# Patient Record
Sex: Male | Born: 1970 | Race: White | Hispanic: No | Smoking: Current every day smoker
Health system: Southern US, Community
[De-identification: ages and names within clinical notes are randomized; demographics above are authoritative.]

## PROBLEM LIST (undated history)

## (undated) DIAGNOSIS — M51369 Other intervertebral disc degeneration, lumbar region without mention of lumbar back pain or lower extremity pain: Secondary | ICD-10-CM

## (undated) DIAGNOSIS — I214 Non-ST elevation (NSTEMI) myocardial infarction: Secondary | ICD-10-CM

## (undated) DIAGNOSIS — J449 Chronic obstructive pulmonary disease, unspecified: Secondary | ICD-10-CM

## (undated) DIAGNOSIS — Z8739 Personal history of other diseases of the musculoskeletal system and connective tissue: Secondary | ICD-10-CM

## (undated) DIAGNOSIS — M779 Enthesopathy, unspecified: Secondary | ICD-10-CM

## (undated) DIAGNOSIS — M47817 Spondylosis without myelopathy or radiculopathy, lumbosacral region: Secondary | ICD-10-CM

## (undated) DIAGNOSIS — R1314 Dysphagia, pharyngoesophageal phase: Secondary | ICD-10-CM

## (undated) DIAGNOSIS — S42009A Fracture of unspecified part of unspecified clavicle, initial encounter for closed fracture: Secondary | ICD-10-CM

## (undated) DIAGNOSIS — Z9289 Personal history of other medical treatment: Secondary | ICD-10-CM

## (undated) DIAGNOSIS — J189 Pneumonia, unspecified organism: Secondary | ICD-10-CM

## (undated) DIAGNOSIS — M5136 Other intervertebral disc degeneration, lumbar region: Secondary | ICD-10-CM

## (undated) DIAGNOSIS — Z720 Tobacco use: Secondary | ICD-10-CM

## (undated) DIAGNOSIS — D649 Anemia, unspecified: Secondary | ICD-10-CM

## (undated) DIAGNOSIS — R079 Chest pain, unspecified: Secondary | ICD-10-CM

## (undated) DIAGNOSIS — F1010 Alcohol abuse, uncomplicated: Secondary | ICD-10-CM

## (undated) DIAGNOSIS — M1990 Unspecified osteoarthritis, unspecified site: Secondary | ICD-10-CM

## (undated) DIAGNOSIS — I10 Essential (primary) hypertension: Secondary | ICD-10-CM

## (undated) HISTORY — DX: Other intervertebral disc degeneration, lumbar region without mention of lumbar back pain or lower extremity pain: M51.369

## (undated) HISTORY — PX: FRACTURE SURGERY: SHX138

## (undated) HISTORY — DX: Alcohol abuse, uncomplicated: F10.10

## (undated) HISTORY — DX: Chronic obstructive pulmonary disease, unspecified: J44.9

## (undated) HISTORY — DX: Other intervertebral disc degeneration, lumbar region: M51.36

## (undated) HISTORY — DX: Tobacco use: Z72.0

## (undated) HISTORY — PX: INGUINAL HERNIA REPAIR: SUR1180

## (undated) HISTORY — DX: Dysphagia, pharyngoesophageal phase: R13.14

## (undated) HISTORY — DX: Spondylosis without myelopathy or radiculopathy, lumbosacral region: M47.817

## (undated) HISTORY — DX: Fracture of unspecified part of unspecified clavicle, initial encounter for closed fracture: S42.009A

## (undated) HISTORY — DX: Enthesopathy, unspecified: M77.9

---

## 1997-09-10 ENCOUNTER — Emergency Department (HOSPITAL_COMMUNITY): Admission: EM | Admit: 1997-09-10 | Discharge: 1997-09-10 | Payer: Self-pay | Admitting: Emergency Medicine

## 1998-09-09 ENCOUNTER — Encounter: Payer: Self-pay | Admitting: Emergency Medicine

## 1998-09-09 ENCOUNTER — Emergency Department (HOSPITAL_COMMUNITY): Admission: EM | Admit: 1998-09-09 | Discharge: 1998-09-09 | Payer: Self-pay | Admitting: Emergency Medicine

## 2000-01-07 ENCOUNTER — Encounter: Payer: Self-pay | Admitting: Emergency Medicine

## 2000-01-07 ENCOUNTER — Observation Stay (HOSPITAL_COMMUNITY): Admission: EM | Admit: 2000-01-07 | Discharge: 2000-01-07 | Payer: Self-pay | Admitting: Emergency Medicine

## 2000-02-07 ENCOUNTER — Encounter: Payer: Self-pay | Admitting: Emergency Medicine

## 2000-02-07 ENCOUNTER — Emergency Department (HOSPITAL_COMMUNITY): Admission: EM | Admit: 2000-02-07 | Discharge: 2000-02-07 | Payer: Self-pay | Admitting: Emergency Medicine

## 2000-08-06 ENCOUNTER — Encounter: Payer: Self-pay | Admitting: Emergency Medicine

## 2000-08-06 ENCOUNTER — Emergency Department (HOSPITAL_COMMUNITY): Admission: EM | Admit: 2000-08-06 | Discharge: 2000-08-06 | Payer: Self-pay | Admitting: Emergency Medicine

## 2003-11-03 ENCOUNTER — Emergency Department (HOSPITAL_COMMUNITY): Admission: EM | Admit: 2003-11-03 | Discharge: 2003-11-03 | Payer: Self-pay | Admitting: Emergency Medicine

## 2004-11-07 ENCOUNTER — Emergency Department (HOSPITAL_COMMUNITY): Admission: EM | Admit: 2004-11-07 | Discharge: 2004-11-07 | Payer: Self-pay | Admitting: Emergency Medicine

## 2004-11-09 ENCOUNTER — Emergency Department (HOSPITAL_COMMUNITY): Admission: EM | Admit: 2004-11-09 | Discharge: 2004-11-09 | Payer: Self-pay | Admitting: Emergency Medicine

## 2005-02-26 ENCOUNTER — Emergency Department (HOSPITAL_COMMUNITY): Admission: EM | Admit: 2005-02-26 | Discharge: 2005-02-26 | Payer: Self-pay | Admitting: Emergency Medicine

## 2005-03-08 ENCOUNTER — Emergency Department (HOSPITAL_COMMUNITY): Admission: EM | Admit: 2005-03-08 | Discharge: 2005-03-08 | Payer: Self-pay | Admitting: Emergency Medicine

## 2006-10-24 ENCOUNTER — Emergency Department (HOSPITAL_COMMUNITY): Admission: EM | Admit: 2006-10-24 | Discharge: 2006-10-24 | Payer: Self-pay | Admitting: Emergency Medicine

## 2007-04-25 DIAGNOSIS — S42009A Fracture of unspecified part of unspecified clavicle, initial encounter for closed fracture: Secondary | ICD-10-CM

## 2007-04-25 HISTORY — PX: ORIF CLAVICLE FRACTURE: SUR924

## 2007-04-25 HISTORY — DX: Fracture of unspecified part of unspecified clavicle, initial encounter for closed fracture: S42.009A

## 2007-10-26 ENCOUNTER — Emergency Department (HOSPITAL_COMMUNITY): Admission: EM | Admit: 2007-10-26 | Discharge: 2007-10-26 | Payer: Self-pay | Admitting: Emergency Medicine

## 2008-03-03 ENCOUNTER — Inpatient Hospital Stay (HOSPITAL_COMMUNITY): Admission: EM | Admit: 2008-03-03 | Discharge: 2008-03-04 | Payer: Self-pay | Admitting: General Surgery

## 2008-03-31 ENCOUNTER — Ambulatory Visit (HOSPITAL_COMMUNITY): Admission: RE | Admit: 2008-03-31 | Discharge: 2008-03-31 | Payer: Self-pay | Admitting: Orthopedic Surgery

## 2008-08-31 ENCOUNTER — Emergency Department (HOSPITAL_COMMUNITY): Admission: EM | Admit: 2008-08-31 | Discharge: 2008-09-01 | Payer: Self-pay | Admitting: Emergency Medicine

## 2009-12-08 ENCOUNTER — Ambulatory Visit: Payer: Self-pay | Admitting: Internal Medicine

## 2009-12-08 DIAGNOSIS — Z7289 Other problems related to lifestyle: Secondary | ICD-10-CM

## 2009-12-08 DIAGNOSIS — F17200 Nicotine dependence, unspecified, uncomplicated: Secondary | ICD-10-CM

## 2009-12-08 DIAGNOSIS — T23239A Burn of second degree of unspecified multiple fingers (nail), not including thumb, initial encounter: Secondary | ICD-10-CM | POA: Insufficient documentation

## 2009-12-08 DIAGNOSIS — Z862 Personal history of diseases of the blood and blood-forming organs and certain disorders involving the immune mechanism: Secondary | ICD-10-CM

## 2009-12-08 DIAGNOSIS — R209 Unspecified disturbances of skin sensation: Secondary | ICD-10-CM | POA: Insufficient documentation

## 2009-12-08 DIAGNOSIS — M542 Cervicalgia: Secondary | ICD-10-CM | POA: Insufficient documentation

## 2009-12-10 ENCOUNTER — Ambulatory Visit: Payer: Self-pay | Admitting: Internal Medicine

## 2009-12-10 ENCOUNTER — Encounter: Payer: Self-pay | Admitting: Family Medicine

## 2009-12-13 ENCOUNTER — Encounter (INDEPENDENT_AMBULATORY_CARE_PROVIDER_SITE_OTHER): Payer: Self-pay | Admitting: *Deleted

## 2009-12-13 LAB — CONVERTED CEMR LAB
ALT: 12 units/L (ref 0–53)
Albumin: 4.8 g/dL (ref 3.5–5.2)
Alkaline Phosphatase: 59 units/L (ref 39–117)
CO2: 22 meq/L (ref 19–32)
Calcium: 9.9 mg/dL (ref 8.4–10.5)
Chloride: 103 meq/L (ref 96–112)
Hemoglobin: 15.4 g/dL (ref 13.0–17.0)
LDL Cholesterol: 132 mg/dL — ABNORMAL HIGH (ref 0–99)
Lymphocytes Relative: 23 % (ref 12–46)
Monocytes Absolute: 0.8 10*3/uL (ref 0.1–1.0)
Neutro Abs: 6.6 10*3/uL (ref 1.7–7.7)
Platelets: 318 10*3/uL (ref 150–400)
TSH: 1.33 microintl units/mL (ref 0.350–4.500)
Total CHOL/HDL Ratio: 3
Triglycerides: 44 mg/dL (ref <150)
VLDL: 9 mg/dL (ref 0–40)
WBC: 9.9 10*3/uL (ref 4.0–10.5)

## 2010-01-14 ENCOUNTER — Ambulatory Visit: Payer: Self-pay | Admitting: Internal Medicine

## 2010-01-14 DIAGNOSIS — R1310 Dysphagia, unspecified: Secondary | ICD-10-CM

## 2010-02-07 ENCOUNTER — Ambulatory Visit (HOSPITAL_COMMUNITY): Admission: RE | Admit: 2010-02-07 | Discharge: 2010-02-07 | Payer: Self-pay | Admitting: Family Medicine

## 2010-04-06 ENCOUNTER — Encounter: Payer: Self-pay | Admitting: Family Medicine

## 2010-04-06 DIAGNOSIS — M47817 Spondylosis without myelopathy or radiculopathy, lumbosacral region: Secondary | ICD-10-CM

## 2010-04-06 HISTORY — DX: Spondylosis without myelopathy or radiculopathy, lumbosacral region: M47.817

## 2010-04-21 ENCOUNTER — Ambulatory Visit
Admission: RE | Admit: 2010-04-21 | Discharge: 2010-04-21 | Payer: Self-pay | Source: Home / Self Care | Attending: Internal Medicine | Admitting: Internal Medicine

## 2010-04-21 DIAGNOSIS — M545 Low back pain, unspecified: Secondary | ICD-10-CM | POA: Insufficient documentation

## 2010-04-26 ENCOUNTER — Encounter: Payer: Self-pay | Admitting: Family Medicine

## 2010-04-26 ENCOUNTER — Ambulatory Visit
Admission: RE | Admit: 2010-04-26 | Discharge: 2010-04-26 | Payer: Self-pay | Source: Home / Self Care | Attending: Internal Medicine | Admitting: Internal Medicine

## 2010-04-27 ENCOUNTER — Telehealth: Payer: Self-pay | Admitting: Family Medicine

## 2010-05-02 ENCOUNTER — Ambulatory Visit
Admission: RE | Admit: 2010-05-02 | Discharge: 2010-05-02 | Payer: Self-pay | Source: Home / Self Care | Attending: Family Medicine | Admitting: Family Medicine

## 2010-05-06 ENCOUNTER — Encounter: Payer: Self-pay | Admitting: Family Medicine

## 2010-05-14 ENCOUNTER — Encounter: Payer: Self-pay | Admitting: Family Medicine

## 2010-05-15 ENCOUNTER — Encounter: Payer: Self-pay | Admitting: *Deleted

## 2010-05-18 DIAGNOSIS — Z0289 Encounter for other administrative examinations: Secondary | ICD-10-CM

## 2010-05-19 ENCOUNTER — Ambulatory Visit: Admit: 2010-05-19 | Payer: Self-pay | Admitting: Family Medicine
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
## 2010-05-24 NOTE — Letter (Signed)
Summary: Generic Letter  [facility] at [facility]  [address] [place], [place] [address]   Phone: [phone]  Fax: [phone]    12/13/2009  [name] [address] [place], [place]  [address]  Dear [name],  All of your labs are currently within normal ranges. I have a included a copy for your review and records. Dr. Sharen Hones still recommends quitting smoking. We will be glad to help you with this if you so desire.   Please do not hesitate to contact our office should you have any questions or concerns.            Sincerely,      Selena Batten Dance CMA (AAMA)for  Terry Canes Gutierrez,MD
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
## 2010-05-26 NOTE — Letter (Signed)
Summary: ERROR  [place] at [facility]  [address] [place], [place] [address]   Phone: [phone]  Fax: [phone]    April 26, 2010   Employee:  USIEL ASTARITA    To Whom It May Concern:   For Medical reasons, please excuse the above named employee from work for the following dates:  Start:  April 26, 2010   End:  April 26, 2010   Pt was also seen in clinic on 04/21/2010.  If you need additional information, please feel free to contact our office.         Sincerely,    Eustaquio Boyden  MD
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
## 2010-05-26 NOTE — Progress Notes (Signed)
Summary: Outgoing call to patient  ---- Converted from flag ---- ---- 04/26/2010 6:31 PM, Terry Boyden  MD wrote: can we call with update this afternoon?  if no better, offer referral to ortho for further eval, possible epidural injections   Spoke with patient. He says he is no better at all. It hurts to even turn over in bed. He says the only that even remotely eases the pain is "drinking a little". I advised he shouldn't do that with medications, but he said he wanted to be honest which I told him I appreciated. He is good with a referral and I told him to expect a call from someone in regards to one. Kim Dance CMA Duncan Dull)  April 27, 2010 1:06 PM  ------------------------------  referral in system.  please fax latest ov as well as radiology report. Terry Boyden  MD  April 27, 2010 1:27 PM
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
## 2010-05-26 NOTE — Letter (Signed)
Summary: Out of Work  [facility]  [address] [place], [place] [address]   Phone: [phone]  Fax: [phone]    April 26, 2010   Employee:  ASHER TORPEY    To Whom It May Concern:   For Medical reasons, please excuse the above named employee from work for the following dates:  Start:  April 26, 2010   End:  April 29, 2010   If you need additional information, please feel free to contact our office.         Sincerely,    Eustaquio Boyden  MD

## 2010-08-02 LAB — COMPREHENSIVE METABOLIC PANEL
ALT: 19 U/L (ref 0–53)
BUN: 3 mg/dL — ABNORMAL LOW (ref 6–23)
CO2: 31 mEq/L (ref 19–32)
Calcium: 8.6 mg/dL (ref 8.4–10.5)
GFR calc Af Amer: >60 mL/min (ref >60)
GFR calc non Af Amer: >60 mL/min (ref >60)
Sodium: 139 mEq/L (ref 135–145)
Total Protein: 6.5 g/dL (ref 6.0–8.3)

## 2010-08-02 LAB — CBC
HCT: 43.4 % (ref 39.0–52.0)
MCV: 94.5 fL (ref 78.0–100.0)
RBC: 4.59 MIL/uL (ref 4.22–5.81)
RDW: 14.3 % (ref 11.5–15.5)
WBC: 8.3 10*3/uL (ref 4.0–10.5)

## 2010-08-02 LAB — DIFFERENTIAL
Basophils Absolute: 0.1 10*3/uL (ref 0.0–0.1)
Basophils Relative: 1 % (ref 0–1)

## 2010-08-02 LAB — SALICYLATE LEVEL: Salicylate Lvl: <4 mg/dL (ref 2.8–20.0)

## 2010-09-06 NOTE — Op Note (Signed)
NAMECONAN, MCMANAWAY                  ACCOUNT NO.:  [id number]   MEDICAL RECORD NO.:  [id number]          PATIENT TYPE:  AMB   LOCATION:  SDS                          FACILITY:  [facility]   PHYSICIAN:  Doralee Albino. Carola Frost, M.D. DATE OF BIRTH:  05-17-1970   DATE OF PROCEDURE:  03/31/2008  DATE OF DISCHARGE:                               OPERATIVE REPORT   PREOPERATIVE DIAGNOSIS:  Left clavicle fracture.   POSTOPERATIVE DIAGNOSIS:  Left clavicle fracture.   PROCEDURE:  Open reduction and internal fixation of left clavicle.   SURGEON:  Doralee Albino. Carola Frost, MD   ASSISTANT:  Mearl Latin, PA-C   ANESTHESIA:  General.   COMPLICATIONS:  None.   SPECIMENS:  None.   ESTIMATED BLOOD LOSS:  40 mL.   DISPOSITION:  To PACU.   CONDITION:  Stable.   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  He sustained a severely shortened left clavicle  fracture, and we discussed preoperative risks and benefits of surgical  versus nonsurgical treatment given the 31 mm of shortening.  After that  discussion, he did wish to proceed with surgery accepting the risks of  malunion, nonunion, infection, nerve injury, vessel injury, need for  further surgery, DVT, and others, and after full discussion again wished  to proceed.   DESCRIPTION OF PROCEDURE:  Mr. Medlock was administered preop antibiotics  and taken to the operating room where general anesthesia was induced.  His left upper extremity was prepped and draped in usual sterile fashion  after being positioned supine on the radiolucent table.  I then made a  standard linear incision over the clavicle, carrying dissection  carefully down to the superior surface, where the fracture site was  identified.  There was no periosteum overlying the shortened lateral  segment, but it was protected anteriorly, inferiorly, and posteriorly.  The proximal  segment retained all of its periosteal attachments, after  carefully freeing this up was able to reestablish length, checked the  provisional reduction obtained with a lag screw, and then applied  appropriate size plate, which allowed for compression of the fracture  through the dynamic holes as well as 3 bicortical screws in the proximal  fragment, including 1 which was locked, as well as 4 in the lateral  segment, 1 of which was locked.  The patient had excellent bone quality.  Final AP, cephalic, and caudal views confirmed appropriate reduction and  hardware length and placement.  Wound was copiously irrigated and then  closed in standard layered fashion with 0 Vicryl, 2-0 Vicryl, and  running Prolene and Steri-Strips.  Local consisting of Marcaine was then  injected into the area.  The patient had a sterile gently compressive  dressing applied and then a sling, and was taken to the PACU in stable  condition.  Montez Morita, PA-C assisted throughout the procedure with  careful retraction to prevent neurovascular injury, assistance with  provisional placement of fixation as well as definitive fixation, and  then helping with  simultaneous closure as well.   PROGNOSIS:  Mr. Sommerville should go on to unite as we were able to protect  the periosteal layer, but certainly his smoking increased his risk of  nonunion.  Restoring length if he is able to go on and heal and avoid  early complications should improve his strength and endurance of the  shoulder for return to active work as well.  If he is sufficiently  comfortable in the recovery room, we anticipate discharge to home versus  a 23-hour observation.      Doralee Albino. Carola Frost, M.D.  Electronically Signed     MHH/MEDQ  D:  03/31/2008  T:  03/31/2008  Job:  [id number]
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
## 2010-10-04 ENCOUNTER — Emergency Department (HOSPITAL_COMMUNITY)
Admission: EM | Admit: 2010-10-04 | Discharge: 2010-10-04 | Disposition: A | Discharge status: Home / Self Care | Payer: PRIVATE HEALTH INSURANCE | Attending: Emergency Medicine | Admitting: Emergency Medicine

## 2010-10-04 DIAGNOSIS — A938 Other specified arthropod-borne viral fevers: Secondary | ICD-10-CM | POA: Insufficient documentation

## 2010-10-04 DIAGNOSIS — S30860A Insect bite (nonvenomous) of lower back and pelvis, initial encounter: Secondary | ICD-10-CM | POA: Insufficient documentation

## 2010-10-04 DIAGNOSIS — R5381 Other malaise: Secondary | ICD-10-CM | POA: Insufficient documentation

## 2010-10-04 DIAGNOSIS — IMO0001 Reserved for inherently not codable concepts without codable children: Secondary | ICD-10-CM | POA: Insufficient documentation

## 2010-10-04 DIAGNOSIS — N5089 Other specified disorders of the male genital organs: Secondary | ICD-10-CM | POA: Insufficient documentation

## 2010-10-04 DIAGNOSIS — W57XXXA Bitten or stung by nonvenomous insect and other nonvenomous arthropods, initial encounter: Secondary | ICD-10-CM | POA: Insufficient documentation

## 2010-10-04 DIAGNOSIS — R51 Headache: Secondary | ICD-10-CM | POA: Insufficient documentation

## 2010-10-07 ENCOUNTER — Emergency Department (HOSPITAL_COMMUNITY): Payer: PRIVATE HEALTH INSURANCE

## 2010-10-07 ENCOUNTER — Emergency Department (HOSPITAL_COMMUNITY)
Admission: EM | Admit: 2010-10-07 | Discharge: 2010-10-07 | Disposition: A | Discharge status: Home / Self Care | Payer: PRIVATE HEALTH INSURANCE | Attending: Emergency Medicine | Admitting: Emergency Medicine

## 2010-10-07 DIAGNOSIS — R509 Fever, unspecified: Secondary | ICD-10-CM | POA: Insufficient documentation

## 2010-10-07 DIAGNOSIS — R112 Nausea with vomiting, unspecified: Secondary | ICD-10-CM | POA: Insufficient documentation

## 2010-10-07 DIAGNOSIS — R05 Cough: Secondary | ICD-10-CM | POA: Insufficient documentation

## 2010-10-07 DIAGNOSIS — Z79899 Other long term (current) drug therapy: Secondary | ICD-10-CM | POA: Insufficient documentation

## 2010-10-07 DIAGNOSIS — R059 Cough, unspecified: Secondary | ICD-10-CM | POA: Insufficient documentation

## 2010-10-07 LAB — CBC
HCT: 47.6 % (ref 39.0–52.0)
MCH: 33.2 pg (ref 26.0–34.0)
MCHC: 36.8 g/dL — ABNORMAL HIGH (ref 30.0–36.0)
Platelets: 268 10*3/uL (ref 150–400)
RBC: 5.27 MIL/uL (ref 4.22–5.81)
RDW: 13.6 % (ref 11.5–15.5)

## 2010-10-07 LAB — BASIC METABOLIC PANEL
BUN: 8 mg/dL (ref 6–23)
CO2: 28 mEq/L (ref 19–32)
Calcium: 8.9 mg/dL (ref 8.4–10.5)
Chloride: 101 mEq/L (ref 96–112)
Creatinine, Ser: 0.66 mg/dL (ref 0.50–1.35)
Glucose, Bld: 50 mg/dL — ABNORMAL LOW (ref 70–99)

## 2010-10-07 LAB — DIFFERENTIAL
Eosinophils Absolute: 0.4 10*3/uL (ref 0.0–0.7)
Eosinophils Relative: 4 % (ref 0–5)
Lymphs Abs: 2.1 10*3/uL (ref 0.7–4.0)
Neutro Abs: 8.4 10*3/uL — ABNORMAL HIGH (ref 1.7–7.7)

## 2011-01-24 LAB — DIFFERENTIAL
Basophils Absolute: 0
Basophils Relative: 0
Eosinophils Relative: 0
Lymphocytes Relative: 8 — ABNORMAL LOW
Lymphs Abs: 1.6
Monocytes Relative: 2 — ABNORMAL LOW
Neutro Abs: 17.6 — ABNORMAL HIGH
Neutrophils Relative %: 90 — ABNORMAL HIGH

## 2011-01-24 LAB — URINALYSIS, ROUTINE W REFLEX MICROSCOPIC
Glucose, UA: NEGATIVE
Specific Gravity, Urine: 1.025
Urobilinogen, UA: 0.2

## 2011-01-24 LAB — CBC
HCT: 45.3
Hemoglobin: 15.3
Platelets: 307
RBC: 4.78

## 2011-01-24 LAB — BASIC METABOLIC PANEL
CO2: 24
GFR calc Af Amer: >60

## 2011-01-26 LAB — CBC
HCT: 48.2 % (ref 39.0–52.0)
MCHC: 33.3 g/dL (ref 30.0–36.0)
MCV: 93.6 fL (ref 78.0–100.0)
Platelets: 276 10*3/uL (ref 150–400)
RBC: 5.14 MIL/uL (ref 4.22–5.81)
WBC: 7.9 10*3/uL (ref 4.0–10.5)

## 2011-01-26 LAB — COMPREHENSIVE METABOLIC PANEL
ALT: 19 U/L (ref 0–53)
GFR calc Af Amer: >60 mL/min (ref >60)
Glucose, Bld: 93 mg/dL (ref 70–99)
Total Bilirubin: 0.7 mg/dL (ref 0.3–1.2)
Total Protein: 7 g/dL (ref 6.0–8.3)

## 2011-04-25 HISTORY — PX: CARPAL TUNNEL RELEASE: SHX101

## 2011-06-01 ENCOUNTER — Encounter: Payer: Self-pay | Admitting: Family Medicine

## 2011-06-02 ENCOUNTER — Ambulatory Visit (INDEPENDENT_AMBULATORY_CARE_PROVIDER_SITE_OTHER): Payer: PRIVATE HEALTH INSURANCE | Admitting: Family Medicine

## 2011-06-02 ENCOUNTER — Encounter: Payer: Self-pay | Admitting: Family Medicine

## 2011-06-02 VITALS — BP 142/78 | HR 64 | Temp 98.5°F | Wt 142.8 lb

## 2011-06-02 DIAGNOSIS — R209 Unspecified disturbances of skin sensation: Secondary | ICD-10-CM

## 2011-06-02 MED ORDER — NAPROXEN 500 MG PO TABS: ORAL_TABLET | ORAL | Status: AC

## 2011-06-02 NOTE — Patient Instructions (Signed)
I wonder if this is carpal tunnel syndrome going on - we will refer you to hand doctor for further evaluation. Continue wrist brace. Start anti inflammatory twice daily for next several days with food then as needed.

## 2011-06-02 NOTE — Progress Notes (Signed)
  Subjective:    Patient ID: [name], male    DOB: [birth year], 41 y.o.   MRN: [id number]  HPI CC: hand pain  bilateral hand pain, R>L going on for years.  Worsening over last 2 months.  Pain worse at wrist, as well as radiates medial elbow.  Numbness throughout all 5 fingers, front and back, start proximal to wrist.  Significant worse pain/numbness at night time.  Wakes him up at night.  Endorses some weakness of right hand 2/2 pain (not as strong as prior).  Pain currently 4/10.  Pain located around wrist and right thumb, not fingers as much.  Fingers alternate white/red.  ultracet has helped.  Has brace he uses at home which helps but unable to use this and do work.  Works as Personnel officer.  Continued significant EtOH use - 2-3 beers every other night, then 12 pack on weekend.  EtOH helps pain.  Smoking - 1ppd.  Using e cig.  H/o R wrist hairline fracture as child.  No fevers/chills, neck pain, shooting pain down arms.  No other joint pains.  Denies inciting trauma.  Review of Systems Per HPI    Objective:   Physical Exam  Nursing note and vitals reviewed. Constitutional: He appears well-developed and well-nourished. No distress.  HENT:  Head: Normocephalic and atraumatic.  Mouth/Throat: Oropharynx is clear and moist. No oropharyngeal exudate.  Musculoskeletal: He exhibits no edema.       Mild swelling right wrist, tender to palpation at base of wrist anterior and posterior.  Slight erythema right > left hand No thenar/hypothenar atrophy Several callus  Neurological: No sensory deficit. He exhibits normal muscle tone.  Reflex Scores:      Bicep reflexes are 3+ on the right side and 3+ on the left side.      Patellar reflexes are 3+ on the right side and 3+ on the left side.      Right hand grip strength decreased compared to left. Altered sensation R palmar hand and digits compared to left Unable to tolerate phalen. + tinel on right  Skin: Skin is warm and dry. No rash  noted. There is erythema (mild at hands bilaterally).       Assessment & Plan:

## 2011-06-02 NOTE — Assessment & Plan Note (Signed)
Bilateral wrist pain with R>L hand numbness. Doubt cervical etiology. Refer to hand doctor for eval CTS/discuss treatment options. For now , continue wrist brace at night and NSAIDs for next week regularly. Update me if worsening.

## 2012-10-30 ENCOUNTER — Encounter: Payer: Self-pay | Admitting: Family Medicine

## 2012-10-30 ENCOUNTER — Ambulatory Visit (INDEPENDENT_AMBULATORY_CARE_PROVIDER_SITE_OTHER): Payer: PRIVATE HEALTH INSURANCE | Admitting: Family Medicine

## 2012-10-30 VITALS — BP 128/70 | HR 64 | Temp 99.2°F | Wt 136.0 lb

## 2012-10-30 DIAGNOSIS — T148 Other injury of unspecified body region: Secondary | ICD-10-CM

## 2012-10-30 DIAGNOSIS — W57XXXA Bitten or stung by nonvenomous insect and other nonvenomous arthropods, initial encounter: Secondary | ICD-10-CM

## 2012-10-30 MED ORDER — PREDNISONE 20 MG PO TABS: ORAL_TABLET | ORAL | Status: DC

## 2012-10-30 NOTE — Assessment & Plan Note (Signed)
No sign of envenomation, likely just a pronounced local allergy.  Add on pred taper with GI caution and use benadryl.  If systemic allergic sx to ER.  If sx of infection, none currently, then notify clinic.  He agrees.  Elevate.  Out of work for now. He agrees.  Nontoxic.

## 2012-10-30 NOTE — Progress Notes (Signed)
He was watering the garden and felt something sting him yesterday.  It was a black insect of some sort.  He couldn't ID the bug.  A few minutes later, swelling and pain.  Foot tingles today.  He can bear some weight on the heel but not the forefoot.  No FCNAVD.  Not SOB.  It is a little less painful than yesterday, but still tender.  Tetanus 2011.  No other injury.    Meds, vitals, and allergies reviewed.   ROS: See HPI.  Otherwise, noncontributory.  nad ncat Lips and tongue wnl, no edema No stridor rrr ctab L foot diffusely puffy but not red, not focally ttp but diffusely tender. Normal DP pulse.  Foot isn't hot and no FB noted.

## 2012-10-30 NOTE — Patient Instructions (Addendum)
Take prednisone with food daily.  2-->1-->1/2 tab.  Take benadryl 25mg  twice a day.  It can make you drowsy.  Elevate your leg.  If fever, redness, then notify the clinic.  If short of breath or lip swelling, to ER. Take care.

## 2014-01-12 ENCOUNTER — Ambulatory Visit: Payer: PRIVATE HEALTH INSURANCE | Admitting: Family Medicine

## 2014-01-13 ENCOUNTER — Ambulatory Visit (INDEPENDENT_AMBULATORY_CARE_PROVIDER_SITE_OTHER): Payer: PRIVATE HEALTH INSURANCE | Admitting: Family Medicine

## 2014-01-13 ENCOUNTER — Encounter: Payer: Self-pay | Admitting: Family Medicine

## 2014-01-13 VITALS — BP 105/74 | HR 81 | Temp 98.3°F | Wt 138.0 lb

## 2014-01-13 DIAGNOSIS — M7710 Lateral epicondylitis, unspecified elbow: Secondary | ICD-10-CM | POA: Insufficient documentation

## 2014-01-13 DIAGNOSIS — F439 Reaction to severe stress, unspecified: Secondary | ICD-10-CM | POA: Insufficient documentation

## 2014-01-13 DIAGNOSIS — M7711 Lateral epicondylitis, right elbow: Secondary | ICD-10-CM

## 2014-01-13 DIAGNOSIS — Z733 Stress, not elsewhere classified: Secondary | ICD-10-CM

## 2014-01-13 DIAGNOSIS — I7300 Raynaud's syndrome without gangrene: Secondary | ICD-10-CM

## 2014-01-13 MED ORDER — DICLOFENAC SODIUM 1 % TD GEL: 1.0000 "application " | Freq: Three times a day (TID) | TRANSDERMAL | Status: DC

## 2014-01-13 NOTE — Assessment & Plan Note (Signed)
Anticipate grief contributing along with increased stress at work. Briefly discussed healthy coping strategies for stress. Update if not improving with time for further evaluation.

## 2014-01-13 NOTE — Progress Notes (Signed)
Pre visit review using our clinic review tool, if applicable. No additional management support is needed unless otherwise documented below in the visit note. 

## 2014-01-13 NOTE — Patient Instructions (Addendum)
I think you have tennis elbow of right arm.  Use strap provided today. Try voltaren gel - if too expensive we will prescribe oral anti inflammatory.  Rest elbows - do exercises provided today.  If not improving with this, let us know for referral to sports medicine doctor.  Look below regarding raynaud's phenomenon which you are describing.  Work on healthy ways to relieve stress.  Continue working on quitting smoking.   Raynaud's Syndrome Raynaud's Syndrome is a disorder of the blood vessels in your hands and feet. It occurs when small arteries of the arms/hands or legs/feet become sensitive to cold or emotional upset. This causes the arteries to constrict, or narrow, and reduces blood flow to the area. The color in the fingers or toes changes from white to bluish to red and this is not usually painful. There may be numbness and tingling. Sores on the skin (ulcers) can form. Symptoms are usually relieved by warming. HOME CARE INSTRUCTIONS   Avoid exposure to cold. Keep your whole body warm and dry. Dress in layers. Wear mittens or gloves when handling ice or frozen food and when outdoors. Use holders for glasses or cans containing cold drinks. If possible, stay indoors during cold weather.  Limit your use of caffeine. Switch to decaffeinated coffee, tea, and soda pop. Avoid chocolate.  Avoid smoking or being around cigarette smoke. Smoke will make symptoms worse.  Wear loose fitting socks and comfortable, roomy shoes.  Avoid vibrating tools and machinery.  If possible, avoid stressful and emotional situations. Exercise, meditation and yoga may help you cope with stress. Biofeedback may be useful.  Ask your caregiver about medicine (calcium channel blockers) that may control Raynaud's phenomena. SEEK MEDICAL CARE IF:   Your discomfort becomes worse, despite conservative treatment.  You develop sores on your fingers and toes that do not heal. Document Released: 04/07/2000 Document  Revised: 07/03/2011 Document Reviewed: 04/14/2008 [facility] Patient Information 2015 [place], [place]. This information is not intended to replace advice given to you by your health care provider. Make sure you discuss any questions you have with your health care provider.

## 2014-01-13 NOTE — Assessment & Plan Note (Signed)
Discussed described raynaud's syndrome, pt education handout provided today.

## 2014-01-13 NOTE — Progress Notes (Signed)
   BP 105/74  Pulse 81  Temp(Src) 98.3 F (36.8 C) (Tympanic)  Wt 138 lb (62.596 kg)  SpO2 96%   CC: arm pain  Subjective:    Patient ID: [name], male    DOB: 03/03/1971, 43 y.o.   MRN: [id number]  HPI: [name] is a 43 y.o. male presenting on 01/13/2014 for Arm Pain   Last seen by myself 2013. At that time, concern for CTS and referred to hand surgeon - s/p CTS release and this has improved.  Now with several month history of R>L arm pain at lateral elbow. Denies inciting trauma or falls. Denies paresthesias. Occasional icy pain up shoulder. Affecting work. Worse in am and in evenings. Not as painful during the day. No swelling or erythema or redness of elbow joint. Works as Clinical biochemist. Wrapping copper around elbow helped initially.  Aleve not helping. Naprosyn didn't help either. Pain relieved with massage.   Also describes pallor and numbness of digit tips R>L with cool weather, states fingers turn white.  Mother passed away several months ago - increased dread/anxiety since then.  Smoking - 1 ppd.  EtOH - social on weekends.   Relevant past medical, surgical, family and social history reviewed and updated as indicated.  Allergies and medications reviewed and updated. No current outpatient prescriptions on file prior to visit.   No current facility-administered medications on file prior to visit.    Review of Systems Per HPI unless specifically indicated above    Objective:    BP 105/74  Pulse 81  Temp(Src) 98.3 F (36.8 C) (Tympanic)  Wt 138 lb (62.596 kg)  SpO2 96%  Physical Exam  Nursing note and vitals reviewed. Constitutional: He is oriented to person, place, and time. He appears well-developed and well-nourished. No distress.  Musculoskeletal: He exhibits no edema.  No shoulder pain bilaterally No wrist pain bilaterally FROM elbows flex/ext bilaterally Mild discomfort to palpation at left forearm extensor mass Marked tenderness to palpation at R  lateral epicondyle as well as at forearm extensor muscles, pain with pronation > supination against resistance on R 2+ rad pulses bilaterally No erythema, swelling or warmth of UE joints, no active synovitis.  Neurological: He is alert and oriented to person, place, and time.  Skin: Skin is warm and dry. No rash noted.  Psychiatric: He has a normal mood and affect.      Assessment & Plan:   Problem List Items Addressed This Visit   Tennis elbow syndrome - Primary     Exam/story consistent with this, but severe and prolonged case. Advised rest from repetitive forearm movements (but this is predominantly what he does at work), placed in elbow strap, and prescribed voltaren gel for lateral R elbow. If unaffordable, consider meloxicam vs voltaren tablets (naprosyn not effective). Provided with exercises to do from [facility] pt advisor. If not improving with above, would refer to [facility] for further eval/management.    Stress     Anticipate grief contributing along with increased stress at work. Briefly discussed healthy coping strategies for stress. Update if not improving with time for further evaluation.    Raynaud phenomenon     Discussed described raynaud's syndrome, pt education handout provided today.        Follow up plan: Return if symptoms worsen or fail to improve.

## 2014-01-13 NOTE — Assessment & Plan Note (Signed)
Exam/story consistent with this, but severe and prolonged case. Advised rest from repetitive forearm movements (but this is predominantly what he does at work), placed in elbow strap, and prescribed voltaren gel for lateral R elbow. If unaffordable, consider meloxicam vs voltaren tablets (naprosyn not effective). Provided with exercises to do from [facility] pt advisor. If not improving with above, would refer to [facility] for further eval/management.

## 2014-07-24 ENCOUNTER — Encounter (HOSPITAL_COMMUNITY): Payer: Self-pay | Admitting: Emergency Medicine

## 2014-07-24 ENCOUNTER — Emergency Department (HOSPITAL_COMMUNITY)
Admission: EM | Admit: 2014-07-24 | Discharge: 2014-07-24 | Disposition: A | Discharge status: Home / Self Care | Payer: PRIVATE HEALTH INSURANCE | Attending: Emergency Medicine | Admitting: Emergency Medicine

## 2014-07-24 ENCOUNTER — Emergency Department (HOSPITAL_COMMUNITY): Payer: PRIVATE HEALTH INSURANCE

## 2014-07-24 DIAGNOSIS — R110 Nausea: Secondary | ICD-10-CM | POA: Insufficient documentation

## 2014-07-24 DIAGNOSIS — R42 Dizziness and giddiness: Secondary | ICD-10-CM

## 2014-07-24 DIAGNOSIS — J449 Chronic obstructive pulmonary disease, unspecified: Secondary | ICD-10-CM | POA: Insufficient documentation

## 2014-07-24 DIAGNOSIS — Z720 Tobacco use: Secondary | ICD-10-CM | POA: Insufficient documentation

## 2014-07-24 DIAGNOSIS — R0981 Nasal congestion: Secondary | ICD-10-CM | POA: Insufficient documentation

## 2014-07-24 DIAGNOSIS — R509 Fever, unspecified: Secondary | ICD-10-CM | POA: Insufficient documentation

## 2014-07-24 DIAGNOSIS — Z8781 Personal history of (healed) traumatic fracture: Secondary | ICD-10-CM | POA: Insufficient documentation

## 2014-07-24 DIAGNOSIS — M1990 Unspecified osteoarthritis, unspecified site: Secondary | ICD-10-CM | POA: Insufficient documentation

## 2014-07-24 DIAGNOSIS — R51 Headache: Secondary | ICD-10-CM | POA: Insufficient documentation

## 2014-07-24 DIAGNOSIS — E860 Dehydration: Secondary | ICD-10-CM | POA: Insufficient documentation

## 2014-07-24 DIAGNOSIS — R059 Cough, unspecified: Secondary | ICD-10-CM

## 2014-07-24 DIAGNOSIS — J029 Acute pharyngitis, unspecified: Secondary | ICD-10-CM | POA: Insufficient documentation

## 2014-07-24 DIAGNOSIS — R05 Cough: Secondary | ICD-10-CM

## 2014-07-24 LAB — CBC
HCT: 39.8 % (ref 39.0–52.0)
Hemoglobin: 14 g/dL (ref 13.0–17.0)
MCH: 32.3 pg (ref 26.0–34.0)
MCHC: 35.2 g/dL (ref 30.0–36.0)
MCV: 91.9 fL (ref 78.0–100.0)
PLATELETS: 234 10*3/uL (ref 150–400)
RBC: 4.33 MIL/uL (ref 4.22–5.81)
RDW: 13.5 % (ref 11.5–15.5)
WBC: 5.7 10*3/uL (ref 4.0–10.5)

## 2014-07-24 LAB — RAPID STREP SCREEN (MED CTR MEBANE ONLY): Streptococcus, Group A Screen (Direct): NEGATIVE

## 2014-07-24 LAB — COMPREHENSIVE METABOLIC PANEL
ALBUMIN: 4.1 g/dL (ref 3.5–5.2)
ALK PHOS: 48 U/L (ref 39–117)
ALT: 15 U/L (ref 0–53)
AST: 23 U/L (ref 0–37)
Anion gap: 6 (ref 5–15)
BILIRUBIN TOTAL: 0.6 mg/dL (ref 0.3–1.2)
BUN: 7 mg/dL (ref 6–23)
CHLORIDE: 103 mmol/L (ref 96–112)
CO2: 26 mmol/L (ref 19–32)
CREATININE: 0.83 mg/dL (ref 0.50–1.35)
Calcium: 9.1 mg/dL (ref 8.4–10.5)
GFR calc Af Amer: >90 mL/min (ref >90)
GFR calc non Af Amer: >90 mL/min (ref >90)
Glucose, Bld: 114 mg/dL — ABNORMAL HIGH (ref 70–99)
Potassium: 3.5 mmol/L (ref 3.5–5.1)
SODIUM: 135 mmol/L (ref 135–145)
Total Protein: 6.7 g/dL (ref 6.0–8.3)

## 2014-07-24 LAB — I-STAT CG4 LACTIC ACID, ED: LACTIC ACID, VENOUS: 1.24 mmol/L (ref 0.5–2.0)

## 2014-07-24 MED ORDER — ACETAMINOPHEN 325 MG PO TABS
650.0000 mg | ORAL_TABLET | Freq: Once | ORAL | Status: AC
  Administered 2014-07-24: 650 mg via ORAL
  Filled 2014-07-24: qty 2

## 2014-07-24 MED ORDER — KETOROLAC TROMETHAMINE 15 MG/ML IJ SOLN
15.0000 mg | Freq: Once | INTRAMUSCULAR | Status: AC
  Administered 2014-07-24: 15 mg via INTRAVENOUS
  Filled 2014-07-24: qty 1

## 2014-07-24 MED ORDER — SODIUM CHLORIDE 0.9 % IV BOLUS (SEPSIS)
1000.0000 mL | Freq: Once | INTRAVENOUS | Status: AC
  Administered 2014-07-24: 1000 mL via INTRAVENOUS

## 2014-07-24 NOTE — ED Notes (Signed)
Patient states started with sore throat yesterday and now is fatigued, and coughing.   Patient states nauseated, but no vomiting.   Patient states he went to work today then to urgent care, urgent care sent him here.
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
## 2014-07-24 NOTE — Discharge Instructions (Signed)
If you were given medicines take as directed.  If you are on coumadin or contraceptives realize their levels and effectiveness is altered by many different medicines.  If you have any reaction (rash, tongues swelling, other) to the medicines stop taking and see a physician.   Please follow up as directed and return to the ER or see a physician for new or worsening symptoms.  Thank you. Filed Vitals:   07/24/14 1344 07/24/14 1634  BP: 130/77 114/62  Pulse: 108 88  Temp: 100.7 F (38.2 C)   TempSrc: Oral   Resp: 18 12  Height: 5\' 9"  (1.753 m)   Weight: 140 lb (63.504 kg)   SpO2: 99% 100%

## 2014-07-28 LAB — CULTURE, GROUP A STREP

## 2015-03-17 ENCOUNTER — Ambulatory Visit (INDEPENDENT_AMBULATORY_CARE_PROVIDER_SITE_OTHER)
Admission: RE | Admit: 2015-03-17 | Discharge: 2015-03-17 | Disposition: A | Discharge status: Home / Self Care | Payer: No Typology Code available for payment source | Source: Ambulatory Visit | Attending: Family Medicine | Admitting: Family Medicine

## 2015-03-17 ENCOUNTER — Ambulatory Visit (INDEPENDENT_AMBULATORY_CARE_PROVIDER_SITE_OTHER): Payer: No Typology Code available for payment source | Admitting: Family Medicine

## 2015-03-17 ENCOUNTER — Encounter: Payer: Self-pay | Admitting: Family Medicine

## 2015-03-17 VITALS — BP 126/80 | HR 72 | Temp 98.2°F | Ht 68.0 in | Wt 135.5 lb

## 2015-03-17 DIAGNOSIS — S99921A Unspecified injury of right foot, initial encounter: Secondary | ICD-10-CM

## 2015-03-17 DIAGNOSIS — S92911A Unspecified fracture of right toe(s), initial encounter for closed fracture: Secondary | ICD-10-CM | POA: Diagnosis not present
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
## 2015-03-17 NOTE — Progress Notes (Signed)
Pre visit review using our clinic review tool, if applicable. No additional management support is needed unless otherwise documented below in the visit note. 

## 2017-05-24 ENCOUNTER — Ambulatory Visit: Payer: 59 | Admitting: Family Medicine

## 2017-05-24 ENCOUNTER — Ambulatory Visit (INDEPENDENT_AMBULATORY_CARE_PROVIDER_SITE_OTHER)
Admission: RE | Admit: 2017-05-24 | Discharge: 2017-05-24 | Disposition: A | Discharge status: Home / Self Care | Payer: 59 | Source: Ambulatory Visit | Attending: Family Medicine | Admitting: Family Medicine

## 2017-05-24 ENCOUNTER — Encounter: Payer: Self-pay | Admitting: Family Medicine

## 2017-05-24 VITALS — BP 120/70 | HR 80 | Temp 98.6°F | Wt 144.2 lb

## 2017-05-24 DIAGNOSIS — M79641 Pain in right hand: Secondary | ICD-10-CM

## 2017-05-24 LAB — BASIC METABOLIC PANEL
BUN: 15 mg/dL (ref 6–23)
CO2: 31 mEq/L (ref 19–32)
CREATININE: 0.76 mg/dL (ref 0.40–1.50)
Calcium: 9.8 mg/dL (ref 8.4–10.5)
Chloride: 103 mEq/L (ref 96–112)
GFR: 116.44 mL/min (ref >60.00)
Glucose, Bld: 72 mg/dL (ref 70–99)
Potassium: 4.1 mEq/L (ref 3.5–5.1)
Sodium: 138 mEq/L (ref 135–145)

## 2017-05-24 LAB — CBC WITH DIFFERENTIAL/PLATELET
Basophils Absolute: 0.1 10*3/uL (ref 0.0–0.1)
Basophils Relative: 1 % (ref 0.0–3.0)
EOS PCT: 2.2 % (ref 0.0–5.0)
Eosinophils Absolute: 0.2 10*3/uL (ref 0.0–0.7)
HCT: 47.2 % (ref 39.0–52.0)
Hemoglobin: 16.4 g/dL (ref 13.0–17.0)
Lymphocytes Relative: 25.9 % (ref 12.0–46.0)
Lymphs Abs: 2.4 10*3/uL (ref 0.7–4.0)
MCHC: 34.8 g/dL (ref 30.0–36.0)
MCV: 97 fl (ref 78.0–100.0)
Monocytes Absolute: 0.8 10*3/uL (ref 0.1–1.0)
Monocytes Relative: 8.6 % (ref 3.0–12.0)
NEUTROS ABS: 5.7 10*3/uL (ref 1.4–7.7)
NEUTROS PCT: 62.3 % (ref 43.0–77.0)
PLATELETS: 332 10*3/uL (ref 150.0–400.0)
RBC: 4.87 Mil/uL (ref 4.22–5.81)
RDW: 13.7 % (ref 11.5–15.5)
WBC: 9.2 10*3/uL (ref 4.0–10.5)

## 2017-05-24 LAB — URIC ACID: Uric Acid, Serum: 5.5 mg/dL (ref 4.0–7.8)

## 2017-05-24 MED ORDER — NAPROXEN 500 MG PO TABS: ORAL_TABLET | ORAL | 0 refills | Status: DC

## 2017-05-24 NOTE — Patient Instructions (Addendum)
Possible inflamed tendon nodule or arthritis - labs and xray today Start naprosyn anti inflammatory twice daily with meals for next week. Don't take with BC powder or ibuprofen.  If unrevealing, we will send you to hand doctor.

## 2017-05-24 NOTE — Progress Notes (Signed)
BP 120/70 (BP Location: Left Arm, Patient Position: Sitting, Cuff Size: Normal)   Pulse 80   Temp 98.6 F (37 C) (Oral)   Wt 144 lb 4 oz (65.4 kg)   SpO2 100%   BMI 21.93 kg/m    CC: hand pain Subjective:    Patient ID: [name], male    DOB: 1970-04-30, 47 y.o.   MRN: [id number]  HPI: [name] is a 47 y.o. male presenting on 05/24/2017 for Hand Pain (right middle finger in pain, hurts to bend. started a month or so ago. taking otc and was getting some relief but now pt states that nothing is helping. cold therapy makes it worse, heat therapy helps a little.)   I last saw patient 12/2013.   1 mo h/o R 3rd MCP pain, constant throbbing ache, but acute worsening severe sharp stabbing pain. Mild swelling. Worse with cold. Heat helps. No redness or warmth of joint. Denies inciting trauma, injury, fall. No fevers/chills, nausea. Out of work today due to hand pain.   Has been treating with BC arthritis, ibuprofen, tylenol with limited improvement. Flexeril has helped as well.  H/o gout.  Current 1ppd smoker  Relevant past medical, surgical, family and social history reviewed and updated as indicated. Interim medical history since our last visit reviewed. Allergies and medications reviewed and updated. No outpatient medications prior to visit.   No facility-administered medications prior to visit.      Per HPI unless specifically indicated in ROS section below Review of Systems     Objective:    BP 120/70 (BP Location: Left Arm, Patient Position: Sitting, Cuff Size: Normal)   Pulse 80   Temp 98.6 F (37 C) (Oral)   Wt 144 lb 4 oz (65.4 kg)   SpO2 100%   BMI 21.93 kg/m   Wt Readings from Last 3 Encounters:  05/24/17 144 lb 4 oz (65.4 kg)  03/17/15 135 lb 8 oz (61.5 kg)  07/24/14 140 lb (63.5 kg)    Physical Exam  Constitutional: He is oriented to person, place, and time. He appears well-developed and well-nourished. No distress.  Musculoskeletal: He exhibits no  edema.  2+ rad pulses bilaterally L hand WNL Reproducible tenderness to palpation R hand throughout 3rd MCP joint and just proximal at palm - possible tendon nodule present. FROM flexion/extension at MCP, PIP, DIP with pain No significant active synvoitis  Neurological: He is alert and oriented to person, place, and time.  Sensation intact  Skin: Skin is warm and dry. No rash noted. No erythema.  Calluses on palm of R hand at tender area  Nursing note and vitals reviewed.  Results for orders placed or performed in visit on 05/24/17  CBC with Differential/Platelet  Result Value Ref Range   WBC 9.2 4.0 - 10.5 K/uL   RBC 4.87 4.22 - 5.81 Mil/uL   Hemoglobin 16.4 13.0 - 17.0 g/dL   HCT 47.2 39.0 - 52.0 %   MCV 97.0 78.0 - 100.0 fl   MCHC 34.8 30.0 - 36.0 g/dL   RDW 13.7 11.5 - 15.5 %   Platelets 332.0 150.0 - 400.0 K/uL   Neutrophils Relative % 62.3 43.0 - 77.0 %   Lymphocytes Relative 25.9 12.0 - 46.0 %   Monocytes Relative 8.6 3.0 - 12.0 %   Eosinophils Relative 2.2 0.0 - 5.0 %   Basophils Relative 1.0 0.0 - 3.0 %   Neutro Abs 5.7 1.4 - 7.7 K/uL   Lymphs Abs 2.4  0.7 - 4.0 K/uL   Monocytes Absolute 0.8 0.1 - 1.0 K/uL   Eosinophils Absolute 0.2 0.0 - 0.7 K/uL   Basophils Absolute 0.1 0.0 - 0.1 K/uL  Rheumatoid factor  Result Value Ref Range   Rhuematoid fact SerPl-aCnc <14 <14 IU/mL  Uric acid  Result Value Ref Range   Uric Acid, Serum 5.5 4.0 - 7.8 mg/dL  Basic metabolic panel  Result Value Ref Range   Sodium 138 135 - 145 mEq/L   Potassium 4.1 3.5 - 5.1 mEq/L   Chloride 103 96 - 112 mEq/L   CO2 31 19 - 32 mEq/L   Glucose, Bld 72 70 - 99 mg/dL   BUN 15 6 - 23 mg/dL   Creatinine, Ser 0.76 0.40 - 1.50 mg/dL   Calcium 9.8 8.4 - 10.5 mg/dL   GFR 116.44 >60.00 mL/min  Sedimentation rate  Result Value Ref Range   Sed Rate 2 0 - 15 mm/h       Assessment & Plan:   Problem List Items Addressed This Visit    Right hand pain - Primary    Reproducible tenderness at MCP and  just proximal along possible flexor tendon nodule present. No active triggering of finger. Check labs and xray eval for arthritis, inflammatory arthritis given MCP involved. Rx naprosyn 500mg  bid course. If not improved reviewed with patient planned hand surgery eval. Pt agrees with plan.       Relevant Orders   DG Hand Complete Right (Completed)   CBC with Differential/Platelet (Completed)   Rheumatoid factor (Completed)   Uric acid (Completed)   Basic metabolic panel (Completed)   Sedimentation rate (Completed)       Follow up plan: No Follow-up on file.  Ria Bush, MD

## 2017-05-25 LAB — RHEUMATOID FACTOR: Rhuematoid fact SerPl-aCnc: <14 IU/mL (ref <14)

## 2017-05-25 LAB — SEDIMENTATION RATE: SED RATE: 2 mm/h (ref 0–15)

## 2017-05-26 DIAGNOSIS — M79641 Pain in right hand: Secondary | ICD-10-CM | POA: Insufficient documentation

## 2017-05-26 NOTE — Assessment & Plan Note (Signed)
Reproducible tenderness at MCP and just proximal along possible flexor tendon nodule present. No active triggering of finger. Check labs and xray eval for arthritis, inflammatory arthritis given MCP involved. Rx naprosyn 500mg  bid course. If not improved reviewed with patient planned hand surgery eval. Pt agrees with plan.

## 2017-06-13 ENCOUNTER — Observation Stay (HOSPITAL_COMMUNITY)
Admission: EM | Admit: 2017-06-13 | Discharge: 2017-06-14 | Disposition: A | Discharge status: Home / Self Care | Payer: No Typology Code available for payment source | Attending: Internal Medicine | Admitting: Internal Medicine

## 2017-06-13 ENCOUNTER — Encounter (HOSPITAL_COMMUNITY): Payer: Self-pay

## 2017-06-13 ENCOUNTER — Emergency Department (HOSPITAL_COMMUNITY): Payer: No Typology Code available for payment source

## 2017-06-13 ENCOUNTER — Other Ambulatory Visit: Payer: Self-pay

## 2017-06-13 DIAGNOSIS — Z720 Tobacco use: Secondary | ICD-10-CM | POA: Diagnosis present

## 2017-06-13 DIAGNOSIS — R079 Chest pain, unspecified: Secondary | ICD-10-CM | POA: Diagnosis not present

## 2017-06-13 DIAGNOSIS — Z8679 Personal history of other diseases of the circulatory system: Secondary | ICD-10-CM | POA: Diagnosis present

## 2017-06-13 DIAGNOSIS — R072 Precordial pain: Principal | ICD-10-CM | POA: Insufficient documentation

## 2017-06-13 DIAGNOSIS — M48061 Spinal stenosis, lumbar region without neurogenic claudication: Secondary | ICD-10-CM | POA: Diagnosis not present

## 2017-06-13 DIAGNOSIS — I493 Ventricular premature depolarization: Secondary | ICD-10-CM | POA: Diagnosis not present

## 2017-06-13 DIAGNOSIS — M5136 Other intervertebral disc degeneration, lumbar region: Secondary | ICD-10-CM | POA: Insufficient documentation

## 2017-06-13 DIAGNOSIS — F1010 Alcohol abuse, uncomplicated: Secondary | ICD-10-CM | POA: Insufficient documentation

## 2017-06-13 DIAGNOSIS — Z7289 Other problems related to lifestyle: Secondary | ICD-10-CM

## 2017-06-13 DIAGNOSIS — Z79899 Other long term (current) drug therapy: Secondary | ICD-10-CM | POA: Insufficient documentation

## 2017-06-13 DIAGNOSIS — I10 Essential (primary) hypertension: Secondary | ICD-10-CM | POA: Insufficient documentation

## 2017-06-13 DIAGNOSIS — Z7982 Long term (current) use of aspirin: Secondary | ICD-10-CM | POA: Insufficient documentation

## 2017-06-13 DIAGNOSIS — Z87898 Personal history of other specified conditions: Secondary | ICD-10-CM | POA: Diagnosis not present

## 2017-06-13 DIAGNOSIS — D649 Anemia, unspecified: Secondary | ICD-10-CM | POA: Insufficient documentation

## 2017-06-13 DIAGNOSIS — E7800 Pure hypercholesterolemia, unspecified: Secondary | ICD-10-CM | POA: Insufficient documentation

## 2017-06-13 DIAGNOSIS — I7300 Raynaud's syndrome without gangrene: Secondary | ICD-10-CM | POA: Insufficient documentation

## 2017-06-13 DIAGNOSIS — R748 Abnormal levels of other serum enzymes: Secondary | ICD-10-CM | POA: Diagnosis not present

## 2017-06-13 DIAGNOSIS — E785 Hyperlipidemia, unspecified: Secondary | ICD-10-CM | POA: Diagnosis not present

## 2017-06-13 DIAGNOSIS — J449 Chronic obstructive pulmonary disease, unspecified: Secondary | ICD-10-CM | POA: Diagnosis not present

## 2017-06-13 DIAGNOSIS — I214 Non-ST elevation (NSTEMI) myocardial infarction: Secondary | ICD-10-CM

## 2017-06-13 DIAGNOSIS — F1290 Cannabis use, unspecified, uncomplicated: Secondary | ICD-10-CM | POA: Insufficient documentation

## 2017-06-13 DIAGNOSIS — Z862 Personal history of diseases of the blood and blood-forming organs and certain disorders involving the immune mechanism: Secondary | ICD-10-CM

## 2017-06-13 DIAGNOSIS — F17210 Nicotine dependence, cigarettes, uncomplicated: Secondary | ICD-10-CM | POA: Insufficient documentation

## 2017-06-13 DIAGNOSIS — R1310 Dysphagia, unspecified: Secondary | ICD-10-CM | POA: Insufficient documentation

## 2017-06-13 DIAGNOSIS — F17200 Nicotine dependence, unspecified, uncomplicated: Secondary | ICD-10-CM | POA: Diagnosis not present

## 2017-06-13 DIAGNOSIS — F1090 Alcohol use, unspecified, uncomplicated: Secondary | ICD-10-CM

## 2017-06-13 HISTORY — DX: Non-ST elevation (NSTEMI) myocardial infarction: I21.4

## 2017-06-13 HISTORY — DX: Chest pain, unspecified: R07.9

## 2017-06-13 HISTORY — DX: Essential (primary) hypertension: I10

## 2017-06-13 HISTORY — DX: Personal history of other diseases of the musculoskeletal system and connective tissue: Z87.39

## 2017-06-13 HISTORY — DX: Unspecified osteoarthritis, unspecified site: M19.90

## 2017-06-13 HISTORY — DX: Anemia, unspecified: D64.9

## 2017-06-13 HISTORY — DX: Pneumonia, unspecified organism: J18.9

## 2017-06-13 HISTORY — DX: Personal history of other medical treatment: Z92.89

## 2017-06-13 LAB — BASIC METABOLIC PANEL
Anion gap: 9 (ref 5–15)
BUN: 12 mg/dL (ref 6–20)
CALCIUM: 9.1 mg/dL (ref 8.9–10.3)
CO2: 23 mmol/L (ref 22–32)
Chloride: 107 mmol/L (ref 101–111)
Creatinine, Ser: 0.62 mg/dL (ref 0.61–1.24)
GFR calc Af Amer: >60 mL/min (ref >60)
GFR calc non Af Amer: >60 mL/min (ref >60)
GLUCOSE: 77 mg/dL (ref 65–99)
Potassium: 4.1 mmol/L (ref 3.5–5.1)
Sodium: 139 mmol/L (ref 135–145)

## 2017-06-13 LAB — LIPID PANEL
CHOL/HDL RATIO: 3 ratio
Cholesterol: 189 mg/dL (ref 0–200)
HDL: 64 mg/dL (ref >40)
LDL CALC: 115 mg/dL — AB (ref 0–99)
Triglycerides: 50 mg/dL (ref <150)
VLDL: 10 mg/dL (ref 0–40)

## 2017-06-13 LAB — CBC
HCT: 41.5 % (ref 39.0–52.0)
Hemoglobin: 14.5 g/dL (ref 13.0–17.0)
MCH: 33.4 pg (ref 26.0–34.0)
MCHC: 34.9 g/dL (ref 30.0–36.0)
MCV: 95.6 fL (ref 78.0–100.0)
Platelets: 316 10*3/uL (ref 150–400)
RBC: 4.34 MIL/uL (ref 4.22–5.81)
RDW: 13.9 % (ref 11.5–15.5)
WBC: 6.6 10*3/uL (ref 4.0–10.5)

## 2017-06-13 LAB — TROPONIN I
TROPONIN I: 0.2 ng/mL — AB (ref <0.03)
Troponin I: 0.06 ng/mL (ref <0.03)
Troponin I: 0.13 ng/mL (ref <0.03)

## 2017-06-13 LAB — I-STAT TROPONIN, ED: TROPONIN I, POC: 0.01 ng/mL (ref 0.00–0.08)

## 2017-06-13 LAB — TSH: TSH: 0.506 u[IU]/mL (ref 0.350–4.500)

## 2017-06-13 MED ORDER — SODIUM CHLORIDE 0.9 % IV SOLN
INTRAVENOUS | Status: DC
  Administered 2017-06-13: 12:00:00 via INTRAVENOUS

## 2017-06-13 MED ORDER — MORPHINE SULFATE (PF) 4 MG/ML IV SOLN: 2.0000 mg | INTRAVENOUS | Status: DC | PRN

## 2017-06-13 MED ORDER — VITAMIN B-1 100 MG PO TABS
100.0000 mg | ORAL_TABLET | Freq: Every day | ORAL | Status: DC
  Administered 2017-06-13 – 2017-06-14 (×2): 100 mg via ORAL
  Filled 2017-06-13 (×2): qty 1

## 2017-06-13 MED ORDER — ASPIRIN EC 325 MG PO TBEC: 325.0000 mg | DELAYED_RELEASE_TABLET | Freq: Every day | ORAL | Status: DC

## 2017-06-13 MED ORDER — ACETAMINOPHEN 325 MG PO TABS
650.0000 mg | ORAL_TABLET | ORAL | Status: DC | PRN
Start: 2017-06-13 — End: 2017-06-14

## 2017-06-13 MED ORDER — SODIUM CHLORIDE 0.9 % WEIGHT BASED INFUSION
3.0000 mL/kg/h | INTRAVENOUS | Status: DC
  Administered 2017-06-14: 3 mL/kg/h via INTRAVENOUS

## 2017-06-13 MED ORDER — NICOTINE 21 MG/24HR TD PT24
21.0000 mg | MEDICATED_PATCH | Freq: Every day | TRANSDERMAL | Status: DC
  Administered 2017-06-13 – 2017-06-14 (×2): 21 mg via TRANSDERMAL
  Filled 2017-06-13 (×2): qty 1

## 2017-06-13 MED ORDER — ASPIRIN 81 MG PO CHEW
81.0000 mg | CHEWABLE_TABLET | ORAL | Status: AC
  Administered 2017-06-14: 81 mg via ORAL
  Filled 2017-06-13: qty 1

## 2017-06-13 MED ORDER — ENOXAPARIN SODIUM 40 MG/0.4ML ~~LOC~~ SOLN
40.0000 mg | SUBCUTANEOUS | Status: DC
  Administered 2017-06-13: 40 mg via SUBCUTANEOUS
  Filled 2017-06-13: qty 0.4

## 2017-06-13 MED ORDER — HYDRALAZINE HCL 20 MG/ML IJ SOLN: 5.0000 mg | INTRAMUSCULAR | Status: DC | PRN

## 2017-06-13 MED ORDER — SODIUM CHLORIDE 0.9 % WEIGHT BASED INFUSION: 1.0000 mL/kg/h | INTRAVENOUS | Status: DC

## 2017-06-13 MED ORDER — CARVEDILOL 3.125 MG PO TABS
3.1250 mg | ORAL_TABLET | Freq: Two times a day (BID) | ORAL | Status: DC
  Administered 2017-06-13: 3.125 mg via ORAL
  Filled 2017-06-13 (×2): qty 1

## 2017-06-13 MED ORDER — SODIUM CHLORIDE 0.9% FLUSH: 3.0000 mL | Freq: Two times a day (BID) | INTRAVENOUS | Status: DC

## 2017-06-13 MED ORDER — SODIUM CHLORIDE 0.9% FLUSH: 3.0000 mL | INTRAVENOUS | Status: DC | PRN

## 2017-06-13 MED ORDER — FOLIC ACID 1 MG PO TABS
1.0000 mg | ORAL_TABLET | Freq: Every day | ORAL | Status: DC
  Administered 2017-06-13 – 2017-06-14 (×2): 1 mg via ORAL
  Filled 2017-06-13 (×2): qty 1

## 2017-06-13 MED ORDER — THIAMINE HCL 100 MG/ML IJ SOLN
100.0000 mg | Freq: Every day | INTRAMUSCULAR | Status: DC
  Filled 2017-06-13: qty 2

## 2017-06-13 MED ORDER — PNEUMOCOCCAL VAC POLYVALENT 25 MCG/0.5ML IJ INJ
0.5000 mL | INJECTION | INTRAMUSCULAR | Status: DC
  Filled 2017-06-13: qty 0.5

## 2017-06-13 MED ORDER — ASPIRIN EC 81 MG PO TBEC
81.0000 mg | DELAYED_RELEASE_TABLET | Freq: Every day | ORAL | Status: DC
  Administered 2017-06-13: 81 mg via ORAL
  Filled 2017-06-13: qty 1

## 2017-06-13 MED ORDER — SODIUM CHLORIDE 0.9 % IV BOLUS (SEPSIS)
1000.0000 mL | Freq: Once | INTRAVENOUS | Status: AC
  Administered 2017-06-13: 1000 mL via INTRAVENOUS

## 2017-06-13 MED ORDER — ONDANSETRON HCL 4 MG/2ML IJ SOLN: 4.0000 mg | Freq: Four times a day (QID) | INTRAMUSCULAR | Status: DC | PRN

## 2017-06-13 MED ORDER — GI COCKTAIL ~~LOC~~: 30.0000 mL | Freq: Four times a day (QID) | ORAL | Status: DC | PRN

## 2017-06-13 MED ORDER — LORAZEPAM 2 MG/ML IJ SOLN: 1.0000 mg | Freq: Four times a day (QID) | INTRAMUSCULAR | Status: DC | PRN

## 2017-06-13 MED ORDER — SODIUM CHLORIDE 0.9 % IV SOLN: 250.0000 mL | INTRAVENOUS | Status: DC | PRN

## 2017-06-13 MED ORDER — LORAZEPAM 1 MG PO TABS: 1.0000 mg | ORAL_TABLET | Freq: Four times a day (QID) | ORAL | Status: DC | PRN

## 2017-06-13 MED ORDER — ADULT MULTIVITAMIN W/MINERALS CH
1.0000 | ORAL_TABLET | Freq: Every day | ORAL | Status: DC
  Administered 2017-06-13 – 2017-06-14 (×2): 1 via ORAL
  Filled 2017-06-13 (×2): qty 1

## 2017-06-13 MED ORDER — PRAVASTATIN SODIUM 20 MG PO TABS
10.0000 mg | ORAL_TABLET | Freq: Every day | ORAL | Status: DC
  Administered 2017-06-13: 10 mg via ORAL
  Filled 2017-06-13 (×2): qty 1

## 2017-06-13 NOTE — ED Notes (Signed)
Admitting paged for nicotine patch order per PT request. Pt continues to deny chest pain

## 2017-06-13 NOTE — ED Notes (Signed)
Pt reports he drinks 3-4 beers after work every few days.

## 2017-06-13 NOTE — ED Notes (Signed)
Meal on way to PT room on 5c

## 2017-06-13 NOTE — Progress Notes (Signed)
Admit from ED overflow, awake, alert & oriented. Patient ambulatory. Oriented to room and use of bed. Patient made aware of cardiac cath sched tomorrow AM and will be NPO after midnight. Family member at bedside. Denies any chest pain at present.

## 2017-06-13 NOTE — ED Notes (Signed)
Attempted report x1. 
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
## 2017-06-13 NOTE — ED Notes (Signed)
Patient transported to X-ray 

## 2017-06-13 NOTE — ED Notes (Signed)
Date and time results received: 06/13/17 1402 (use smartphrase ".now" to insert current time)  Test: troponin Critical Value: 0.06  Name of Provider Notified: Dr. Thomasenia Bottoms  Orders Received? Or Actions Taken?: paged Dr. Lorin Mercy

## 2017-06-13 NOTE — ED Notes (Signed)
Lights turned off per request; no needs.
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
## 2017-06-13 NOTE — ED Notes (Signed)
[facility] called about meal tray that has not arrived.
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
## 2017-06-14 ENCOUNTER — Ambulatory Visit (HOSPITAL_COMMUNITY)
Admission: EM | Disposition: A | Discharge status: Home / Self Care | Payer: Self-pay | Source: Home / Self Care | Attending: Emergency Medicine

## 2017-06-14 ENCOUNTER — Encounter (HOSPITAL_COMMUNITY): Payer: Self-pay | Admitting: Interventional Cardiology

## 2017-06-14 DIAGNOSIS — R079 Chest pain, unspecified: Secondary | ICD-10-CM | POA: Diagnosis not present

## 2017-06-14 DIAGNOSIS — R072 Precordial pain: Secondary | ICD-10-CM | POA: Diagnosis not present

## 2017-06-14 DIAGNOSIS — F17210 Nicotine dependence, cigarettes, uncomplicated: Secondary | ICD-10-CM

## 2017-06-14 DIAGNOSIS — F1099 Alcohol use, unspecified with unspecified alcohol-induced disorder: Secondary | ICD-10-CM

## 2017-06-14 HISTORY — PX: LEFT HEART CATH AND CORONARY ANGIOGRAPHY: CATH118249

## 2017-06-14 LAB — CBC
HCT: 40.7 % (ref 39.0–52.0)
HCT: 41.8 % (ref 39.0–52.0)
HEMOGLOBIN: 13.8 g/dL (ref 13.0–17.0)
Hemoglobin: 14 g/dL (ref 13.0–17.0)
MCH: 32.6 pg (ref 26.0–34.0)
MCH: 32.6 pg (ref 26.0–34.0)
MCHC: 33.9 g/dL (ref 30.0–36.0)
MCHC: 33.5 g/dL (ref 30.0–36.0)
MCV: 96.2 fL (ref 78.0–100.0)
MCV: 97.4 fL (ref 78.0–100.0)
PLATELETS: 293 10*3/uL (ref 150–400)
Platelets: 265 10*3/uL (ref 150–400)
RBC: 4.23 MIL/uL (ref 4.22–5.81)
RBC: 4.29 MIL/uL (ref 4.22–5.81)
RDW: 13.7 % (ref 11.5–15.5)
RDW: 14 % (ref 11.5–15.5)
WBC: 7.9 10*3/uL (ref 4.0–10.5)
WBC: 7.2 10*3/uL (ref 4.0–10.5)

## 2017-06-14 LAB — BASIC METABOLIC PANEL
ANION GAP: 9 (ref 5–15)
BUN: 13 mg/dL (ref 6–20)
CO2: 22 mmol/L (ref 22–32)
Calcium: 8.6 mg/dL — ABNORMAL LOW (ref 8.9–10.3)
Chloride: 108 mmol/L (ref 101–111)
Creatinine, Ser: 0.72 mg/dL (ref 0.61–1.24)
GFR calc non Af Amer: >60 mL/min (ref >60)
Glucose, Bld: 98 mg/dL (ref 65–99)
POTASSIUM: 4.2 mmol/L (ref 3.5–5.1)
SODIUM: 139 mmol/L (ref 135–145)

## 2017-06-14 LAB — RAPID URINE DRUG SCREEN, HOSP PERFORMED
Amphetamines: NOT DETECTED
Barbiturates: NOT DETECTED
Benzodiazepines: POSITIVE — AB
Cocaine: NOT DETECTED
Opiates: NOT DETECTED
Tetrahydrocannabinol: NOT DETECTED

## 2017-06-14 LAB — CREATININE, SERUM: CREATININE: 0.63 mg/dL (ref 0.61–1.24)

## 2017-06-14 LAB — TROPONIN I
TROPONIN I: 0.08 ng/mL — AB (ref <0.03)
Troponin I: 0.14 ng/mL (ref <0.03)

## 2017-06-14 LAB — PROTIME-INR
INR: 0.99
PROTHROMBIN TIME: 13 s (ref 11.4–15.2)

## 2017-06-14 LAB — HIV ANTIBODY (ROUTINE TESTING W REFLEX): HIV Screen 4th Generation wRfx: NONREACTIVE

## 2017-06-14 SURGERY — LEFT HEART CATH AND CORONARY ANGIOGRAPHY
Anesthesia: LOCAL

## 2017-06-14 MED ORDER — NICOTINE 21 MG/24HR TD PT24: 21.0000 mg | MEDICATED_PATCH | Freq: Every day | TRANSDERMAL | 0 refills | Status: DC

## 2017-06-14 MED ORDER — HEPARIN SODIUM (PORCINE) 1000 UNIT/ML IJ SOLN
INTRAMUSCULAR | Status: DC | PRN
  Administered 2017-06-14: 3000 [IU] via INTRAVENOUS

## 2017-06-14 MED ORDER — IOPAMIDOL (ISOVUE-370) INJECTION 76%
INTRAVENOUS | Status: DC | PRN
  Administered 2017-06-14: 35 mL via INTRA_ARTERIAL

## 2017-06-14 MED ORDER — VERAPAMIL HCL 2.5 MG/ML IV SOLN
INTRAVENOUS | Status: DC | PRN
  Administered 2017-06-14: 10 mL via INTRA_ARTERIAL

## 2017-06-14 MED ORDER — LIDOCAINE HCL 1 % IJ SOLN
INTRAMUSCULAR | Status: AC
  Filled 2017-06-14: qty 20

## 2017-06-14 MED ORDER — HEPARIN SODIUM (PORCINE) 5000 UNIT/ML IJ SOLN: 5000.0000 [IU] | Freq: Three times a day (TID) | INTRAMUSCULAR | Status: DC

## 2017-06-14 MED ORDER — FENTANYL CITRATE (PF) 100 MCG/2ML IJ SOLN
INTRAMUSCULAR | Status: AC
  Filled 2017-06-14: qty 2

## 2017-06-14 MED ORDER — LIDOCAINE HCL (PF) 1 % IJ SOLN
INTRAMUSCULAR | Status: DC | PRN
  Administered 2017-06-14: 2 mL

## 2017-06-14 MED ORDER — IOPAMIDOL (ISOVUE-370) INJECTION 76%
INTRAVENOUS | Status: AC
  Filled 2017-06-14: qty 100

## 2017-06-14 MED ORDER — MIDAZOLAM HCL 2 MG/2ML IJ SOLN
INTRAMUSCULAR | Status: DC | PRN
  Administered 2017-06-14: 1 mg via INTRAVENOUS

## 2017-06-14 MED ORDER — ASPIRIN 81 MG PO CHEW
81.0000 mg | CHEWABLE_TABLET | Freq: Every day | ORAL | Status: DC
  Administered 2017-06-14: 81 mg via ORAL
  Filled 2017-06-14: qty 1

## 2017-06-14 MED ORDER — SODIUM CHLORIDE 0.9 % WEIGHT BASED INFUSION: 1.8000 mL/kg/h | INTRAVENOUS | Status: DC

## 2017-06-14 MED ORDER — HEPARIN (PORCINE) IN NACL 2-0.9 UNIT/ML-% IJ SOLN
INTRAMUSCULAR | Status: AC | PRN
  Administered 2017-06-14: 500 mL

## 2017-06-14 MED ORDER — SODIUM CHLORIDE 0.9% FLUSH: 3.0000 mL | INTRAVENOUS | Status: DC | PRN

## 2017-06-14 MED ORDER — HEPARIN (PORCINE) IN NACL 2-0.9 UNIT/ML-% IJ SOLN
INTRAMUSCULAR | Status: AC
  Filled 2017-06-14: qty 1000

## 2017-06-14 MED ORDER — VERAPAMIL HCL 2.5 MG/ML IV SOLN
INTRAVENOUS | Status: AC
  Filled 2017-06-14: qty 2

## 2017-06-14 MED ORDER — FENTANYL CITRATE (PF) 100 MCG/2ML IJ SOLN
INTRAMUSCULAR | Status: DC | PRN
  Administered 2017-06-14 (×2): 50 ug via INTRAVENOUS

## 2017-06-14 MED ORDER — MIDAZOLAM HCL 2 MG/2ML IJ SOLN
INTRAMUSCULAR | Status: AC
  Filled 2017-06-14: qty 2

## 2017-06-14 MED ORDER — SODIUM CHLORIDE 0.9% FLUSH: 3.0000 mL | Freq: Two times a day (BID) | INTRAVENOUS | Status: DC

## 2017-06-14 MED ORDER — HEPARIN SODIUM (PORCINE) 1000 UNIT/ML IJ SOLN
INTRAMUSCULAR | Status: AC
  Filled 2017-06-14: qty 1

## 2017-06-14 MED ORDER — ONDANSETRON HCL 4 MG/2ML IJ SOLN: 4.0000 mg | Freq: Four times a day (QID) | INTRAMUSCULAR | Status: DC | PRN

## 2017-06-14 MED ORDER — SODIUM CHLORIDE 0.9 % IV SOLN: 250.0000 mL | INTRAVENOUS | Status: DC | PRN

## 2017-06-14 SURGICAL SUPPLY — 8 items
CATH 5FR JL3.5 JR4 ANG PIG MP (CATHETERS) ×1 IMPLANT
DEVICE RAD COMP TR BAND LRG (VASCULAR PRODUCTS) ×1 IMPLANT
GLIDESHEATH SLEND A-KIT 6F 22G (SHEATH) ×1 IMPLANT
GUIDEWIRE INQWIRE 1.5J.035X260 (WIRE) IMPLANT
INQWIRE 1.5J .035X260CM (WIRE) ×2
KIT PREMIUM HAND CONTROLLER (KITS) ×1 IMPLANT
KIT SINGLE USE MANIFOLD (KITS) ×1 IMPLANT
PACK CARDIAC CATHETERIZATION (CUSTOM PROCEDURE TRAY) ×2 IMPLANT

## 2017-06-14 NOTE — Discharge Instructions (Signed)
Follow with Terry Bush, MD in 3-4 weeks  Please get a complete blood count and chemistry panel checked by your Primary MD at your next visit, and again as instructed by your Primary MD. Please get your medications reviewed and adjusted by your Primary MD.  Please request your Primary MD to go over all Hospital Tests and Procedure/Radiological results at the follow up, please get all Hospital records sent to your Prim MD by signing hospital release before you go home.  If you had Pneumonia of Lung problems at the Hospital: Please get a 2 view Chest X ray done in 6-8 weeks after hospital discharge or sooner if instructed by your Primary MD.  If you have Congestive Heart Failure: Please call your Cardiologist or Primary MD anytime you have any of the following symptoms:  1) 3 pound weight gain in 24 hours or 5 pounds in 1 week  2) shortness of breath, with or without a dry hacking cough  3) swelling in the hands, feet or stomach  4) if you have to sleep on extra pillows at night in order to breathe  Follow cardiac low salt diet and 1.5 lit/day fluid restriction.  If you have diabetes Accuchecks 4 times/day, Once in AM empty stomach and then before each meal. Log in all results and show them to your primary doctor at your next visit. If any glucose reading is under 80 or above 300 call your primary MD immediately.  If you have Seizure/Convulsions/Epilepsy: Please do not drive, operate heavy machinery, participate in activities at heights or participate in high speed sports until you have seen by Primary MD or a Neurologist and advised to do so again.  If you had Gastrointestinal Bleeding: Please ask your Primary MD to check a complete blood count within one week of discharge or at your next visit. Your endoscopic/colonoscopic biopsies that are pending at the time of discharge, will also need to followed by your Primary MD.  Get Medicines reviewed and adjusted. Please take all your  medications with you for your next visit with your Primary MD  Please request your Primary MD to go over all hospital tests and procedure/radiological results at the follow up, please ask your Primary MD to get all Hospital records sent to his/her office.  If you experience worsening of your admission symptoms, develop shortness of breath, life threatening emergency, suicidal or homicidal thoughts you must seek medical attention immediately by calling 911 or calling your MD immediately  if symptoms less severe.  You must read complete instructions/literature along with all the possible adverse reactions/side effects for all the Medicines you take and that have been prescribed to you. Take any new Medicines after you have completely understood and accpet all the possible adverse reactions/side effects.   Do not drive or operate heavy machinery when taking Pain medications.   Do not take more than prescribed Pain, Sleep and Anxiety Medications  Special Instructions: If you have smoked or chewed Tobacco  in the last 2 yrs please stop smoking, stop any regular Alcohol  and or any Recreational drug use.  Wear Seat belts while driving.  Please note You were cared for by a hospitalist during your hospital stay. If you have any questions about your discharge medications or the care you received while you were in the hospital after you are discharged, you can call the unit and asked to speak with the hospitalist on call if the hospitalist that took care of you is not available. Once  you are discharged, your primary care physician will handle any further medical issues. Please note that NO REFILLS for any discharge medications will be authorized once you are discharged, as it is imperative that you return to your primary care physician (or establish a relationship with a primary care physician if you do not have one) for your aftercare needs so that they can reassess your need for medications and monitor your  lab values.  You can reach the hospitalist office at phone [phone] or fax [phone]   If you do not have a primary care physician, you can call [phone] for a physician referral.  Activity: As tolerated with Full fall precautions use walker/cane & assistance as needed  Diet: regular  Disposition Home

## 2017-06-14 NOTE — Progress Notes (Signed)
Provided education regarding not using Right arm and to stop smoking .  Patient told nurse he was heading to work, that he could not miss any more days.  Also started smoking right after walking out door.

## 2017-06-14 NOTE — Plan of Care (Signed)
Pt. Able to perform normal activities.
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
## 2017-06-14 NOTE — Progress Notes (Signed)
Discharge instructions (including medications) discussed with and copy provided to patient/caregiver 

## 2017-06-14 NOTE — Progress Notes (Signed)
Progress Note  Patient Name: [name] Date of Encounter: 06/14/2017  Primary Cardiologist: Johnsie Cancel  Subjective   No chest pain this am   Inpatient Medications    Scheduled Meds: . aspirin EC  81 mg Oral Daily  . carvedilol  3.125 mg Oral BID WC  . enoxaparin (LOVENOX) injection  40 mg Subcutaneous Q24H  . folic acid  1 mg Oral Daily  . multivitamin with minerals  1 tablet Oral Daily  . nicotine  21 mg Transdermal Daily  . pneumococcal 23 valent vaccine  0.5 mL Intramuscular Tomorrow-1000  . pravastatin  10 mg Oral q1800  . sodium chloride flush  3 mL Intravenous Q12H  . thiamine  100 mg Oral Daily   Or  . thiamine  100 mg Intravenous Daily   Continuous Infusions: . sodium chloride 50 mL/hr at 06/13/17 1149  . sodium chloride    . sodium chloride 1 mL/kg/hr (06/14/17 0700)   PRN Meds: sodium chloride, acetaminophen, gi cocktail, hydrALAZINE, LORazepam **OR** LORazepam, morphine injection, ondansetron (ZOFRAN) IV, sodium chloride flush   Vital Signs    Vitals:   06/13/17 1652 06/13/17 2058 06/14/17 0010 06/14/17 0603  BP: 123/78 98/67 117/74 131/71  Pulse: 66 70 76 67  Resp: 20 18 18 18   Temp: 98.7 F (37.1 C) 98.2 F (36.8 C) 97.8 F (36.6 C) 98.2 F (36.8 C)  TempSrc: Oral Oral Oral Oral  SpO2: 100% 99% 100% 100%  Weight: 141 lb 5 oz (64.1 kg)   133 lb 1.6 oz (60.4 kg)  Height: 5\' 9"  (1.753 m)       Intake/Output Summary (Last 24 hours) at 06/14/2017 0850 Last data filed at 06/14/2017 0600 Gross per 24 hour  Intake 1149.17 ml  Output -  Net 1149.17 ml   Filed Weights   06/13/17 0817 06/13/17 1652 06/14/17 0603  Weight: 140 lb (63.5 kg) 141 lb 5 oz (64.1 kg) 133 lb 1.6 oz (60.4 kg)    Telemetry    NSR no arrhythmia - Personally Reviewed  ECG    NSR no acute ST changes  - Personally Reviewed  Physical Exam  Right radial pulse A GEN: No acute distress.   Neck: No JVD Cardiac: RRR, no murmurs, rubs, or gallops.  Respiratory: Clear to  auscultation bilaterally. GI: Soft, nontender, non-distended  MS: No edema; No deformity. Neuro:  Nonfocal  Psych: Normal affect   Labs    Chemistry Recent Labs  Lab 06/13/17 0825 06/14/17 0342  NA 139 139  K 4.1 4.2  CL 107 108  CO2 23 22  GLUCOSE 77 98  BUN 12 13  CREATININE 0.62 0.72  CALCIUM 9.1 8.6*  GFRNONAA >60 >60  GFRAA >60 >60  ANIONGAP 9 9     Hematology Recent Labs  Lab 06/13/17 0825 06/14/17 0342  WBC 6.6 7.9  RBC 4.34 4.23  HGB 14.5 13.8  HCT 41.5 40.7  MCV 95.6 96.2  MCH 33.4 32.6  MCHC 34.9 33.9  RDW 13.9 13.7  PLT 316 265    Cardiac Enzymes Recent Labs  Lab 06/13/17 1000 06/13/17 1302 06/13/17 2114 06/14/17 0342  TROPONINI 0.06* 0.13* 0.20* 0.14*    Recent Labs  Lab 06/13/17 0831  TROPIPOC 0.01     BNPNo results for input(s): BNP, PROBNP in the last 168 hours.   DDimer No results for input(s): DDIMER in the last 168 hours.   Radiology    Dg Chest 2 View  Result Date: 06/13/2017 CLINICAL DATA:  Mid chest pain EXAM: CHEST  2 VIEW COMPARISON:  07/24/2014 FINDINGS: Heart is borderline in size. Lungs clear. No effusions. No acute bony abnormality. Plate and screw fixation device in the left clavicle, unchanged. IMPRESSION: No active cardiopulmonary disease. Electronically Signed   By: Rolm Baptise M.D.   On: 06/13/2017 08:51    Cardiac Studies   None  Patient Profile     47 y.o. male with new onset anginal sounding pain HTN, smoking ETOH abuse and COPD Troponin .13 with inferolateral T wave changes started on heparin ASA and beta blocker  Assessment & Plan    1. Chest Pain:  For cath today discussed risks including stroke, MI, bleeding contrast reaction and need for emergency surgery willing to proceed good right radial pulse Troponin stable this am .14 2. Smoking CXR on admission ok discussed cessation 3. ETOH  No signs of DT;s on beta blocker 4. HTN  Continue coreg 5. Cholesterol on statin   For questions or updates,  please contact [place] Please consult [email or web address] for contact info under Cardiology/STEMI.      Signed, Jenkins Rouge, MD  06/14/2017, 8:50 AM

## 2017-06-14 NOTE — Interval H&P Note (Signed)
Cath Lab Visit (complete for each Cath Lab visit)  Clinical Evaluation Leading to the Procedure:   ACS: Yes.    Non-ACS:    Anginal Classification: CCS III  Anti-ischemic medical therapy: Minimal Therapy (1 class of medications)  Non-Invasive Test Results: No non-invasive testing performed  Prior CABG: No previous CABG      History and Physical Interval Note:  06/14/2017 11:29 AM  [name]  has presented today for surgery, with the diagnosis of cp  The various methods of treatment have been discussed with the patient and family. After consideration of risks, benefits and other options for treatment, the patient has consented to  Procedure(s): LEFT HEART CATH AND CORONARY ANGIOGRAPHY (N/A) as a surgical intervention .  The patient's history has been reviewed, patient examined, no change in status, stable for surgery.  I have reviewed the patient's chart and labs.  Questions were answered to the patient's satisfaction.     Belva Crome III

## 2017-06-14 NOTE — H&P (View-Only) (Signed)
Progress Note  Patient Name: [name] Date of Encounter: 06/14/2017  Primary Cardiologist: Johnsie Cancel  Subjective   No chest pain this am   Inpatient Medications    Scheduled Meds: . aspirin EC  81 mg Oral Daily  . carvedilol  3.125 mg Oral BID WC  . enoxaparin (LOVENOX) injection  40 mg Subcutaneous Q24H  . folic acid  1 mg Oral Daily  . multivitamin with minerals  1 tablet Oral Daily  . nicotine  21 mg Transdermal Daily  . pneumococcal 23 valent vaccine  0.5 mL Intramuscular Tomorrow-1000  . pravastatin  10 mg Oral q1800  . sodium chloride flush  3 mL Intravenous Q12H  . thiamine  100 mg Oral Daily   Or  . thiamine  100 mg Intravenous Daily   Continuous Infusions: . sodium chloride 50 mL/hr at 06/13/17 1149  . sodium chloride    . sodium chloride 1 mL/kg/hr (06/14/17 0700)   PRN Meds: sodium chloride, acetaminophen, gi cocktail, hydrALAZINE, LORazepam **OR** LORazepam, morphine injection, ondansetron (ZOFRAN) IV, sodium chloride flush   Vital Signs    Vitals:   06/13/17 1652 06/13/17 2058 06/14/17 0010 06/14/17 0603  BP: 123/78 98/67 117/74 131/71  Pulse: 66 70 76 67  Resp: 20 18 18 18   Temp: 98.7 F (37.1 C) 98.2 F (36.8 C) 97.8 F (36.6 C) 98.2 F (36.8 C)  TempSrc: Oral Oral Oral Oral  SpO2: 100% 99% 100% 100%  Weight: 141 lb 5 oz (64.1 kg)   133 lb 1.6 oz (60.4 kg)  Height: 5\' 9"  (1.753 m)       Intake/Output Summary (Last 24 hours) at 06/14/2017 0850 Last data filed at 06/14/2017 0600 Gross per 24 hour  Intake 1149.17 ml  Output -  Net 1149.17 ml   Filed Weights   06/13/17 0817 06/13/17 1652 06/14/17 0603  Weight: 140 lb (63.5 kg) 141 lb 5 oz (64.1 kg) 133 lb 1.6 oz (60.4 kg)    Telemetry    NSR no arrhythmia - Personally Reviewed  ECG    NSR no acute ST changes  - Personally Reviewed  Physical Exam  Right radial pulse A GEN: No acute distress.   Neck: No JVD Cardiac: RRR, no murmurs, rubs, or gallops.  Respiratory: Clear to  auscultation bilaterally. GI: Soft, nontender, non-distended  MS: No edema; No deformity. Neuro:  Nonfocal  Psych: Normal affect   Labs    Chemistry Recent Labs  Lab 06/13/17 0825 06/14/17 0342  NA 139 139  K 4.1 4.2  CL 107 108  CO2 23 22  GLUCOSE 77 98  BUN 12 13  CREATININE 0.62 0.72  CALCIUM 9.1 8.6*  GFRNONAA >60 >60  GFRAA >60 >60  ANIONGAP 9 9     Hematology Recent Labs  Lab 06/13/17 0825 06/14/17 0342  WBC 6.6 7.9  RBC 4.34 4.23  HGB 14.5 13.8  HCT 41.5 40.7  MCV 95.6 96.2  MCH 33.4 32.6  MCHC 34.9 33.9  RDW 13.9 13.7  PLT 316 265    Cardiac Enzymes Recent Labs  Lab 06/13/17 1000 06/13/17 1302 06/13/17 2114 06/14/17 0342  TROPONINI 0.06* 0.13* 0.20* 0.14*    Recent Labs  Lab 06/13/17 0831  TROPIPOC 0.01     BNPNo results for input(s): BNP, PROBNP in the last 168 hours.   DDimer No results for input(s): DDIMER in the last 168 hours.   Radiology    Dg Chest 2 View  Result Date: 06/13/2017 CLINICAL DATA:  Mid chest pain EXAM: CHEST  2 VIEW COMPARISON:  07/24/2014 FINDINGS: Heart is borderline in size. Lungs clear. No effusions. No acute bony abnormality. Plate and screw fixation device in the left clavicle, unchanged. IMPRESSION: No active cardiopulmonary disease. Electronically Signed   By: Rolm Baptise M.D.   On: 06/13/2017 08:51    Cardiac Studies   None  Patient Profile     47 y.o. male with new onset anginal sounding pain HTN, smoking ETOH abuse and COPD Troponin .13 with inferolateral T wave changes started on heparin ASA and beta blocker  Assessment & Plan    1. Chest Pain:  For cath today discussed risks including stroke, MI, bleeding contrast reaction and need for emergency surgery willing to proceed good right radial pulse Troponin stable this am .14 2. Smoking CXR on admission ok discussed cessation 3. ETOH  No signs of DT;s on beta blocker 4. HTN  Continue coreg 5. Cholesterol on statin   For questions or updates,  please contact [place] Please consult [email or web address] for contact info under Cardiology/STEMI.      Signed, Jenkins Rouge, MD  06/14/2017, 8:50 AM

## 2017-06-15 MED FILL — Lidocaine HCl Local Inj 1%: INTRAMUSCULAR | Qty: 20 | Status: AC

## 2017-06-18 ENCOUNTER — Telehealth: Payer: Self-pay

## 2017-06-18 NOTE — Telephone Encounter (Signed)
Transition Care Management Follow-up Telephone Call   Date discharged? 06/13/2017   How have you been since you were released from the hospital? "Feeling pretty good". Declines CP. Using Nicotine patches, smoking only "a few" cigarettes per day.    Do you understand why you were in the hospital? Yes. Admitted for acute CP, underwent cardiac cath on 06/14/17.    Do you understand the discharge instructions? yes   Where were you discharged to? Home.    Items Reviewed:  Medications reviewed: yes  Allergies reviewed: yes  Dietary changes reviewed: yes  Referrals reviewed: yes   Functional Questionnaire:   Activities of Daily Living (ADLs):   He states they are independent in the following: ambulation, bathing and hygiene, feeding, continence, grooming, toileting and dressing States they require assistance with the following: None.    Any transportation issues/concerns?: no   Any patient concerns? no   Confirmed importance and date/time of follow-up visits scheduled yes  Provider Appointment booked with PCP 06/26/17 @ 11:30am. Pt requesting afternoon appt.   Confirmed with patient if condition begins to worsen call PCP or go to the ER.  Patient was given the office number and encouraged to call back with question or concerns.  : yes

## 2017-06-19 ENCOUNTER — Other Ambulatory Visit: Payer: Self-pay | Admitting: Family Medicine

## 2017-06-20 NOTE — Telephone Encounter (Signed)
Last filled:  05/24/17, #60 Last OV:  05/24/17 Next OV:  06/28/17

## 2017-06-25 ENCOUNTER — Ambulatory Visit (INDEPENDENT_AMBULATORY_CARE_PROVIDER_SITE_OTHER): Payer: 59 | Admitting: Cardiology

## 2017-06-25 ENCOUNTER — Ambulatory Visit: Payer: 59 | Admitting: Cardiology

## 2017-06-25 ENCOUNTER — Encounter: Payer: Self-pay | Admitting: Cardiology

## 2017-06-25 VITALS — BP 120/78 | HR 110 | Ht 69.0 in | Wt 137.0 lb

## 2017-06-25 DIAGNOSIS — R079 Chest pain, unspecified: Secondary | ICD-10-CM

## 2017-06-25 DIAGNOSIS — Z789 Other specified health status: Secondary | ICD-10-CM | POA: Diagnosis not present

## 2017-06-25 DIAGNOSIS — F17200 Nicotine dependence, unspecified, uncomplicated: Secondary | ICD-10-CM | POA: Diagnosis not present

## 2017-06-25 NOTE — Telephone Encounter (Signed)
Patient called back and rescheduled for 06/28/17 at 9:30am.

## 2017-06-25 NOTE — Patient Instructions (Addendum)
DASH Eating Plan DASH stands for "Dietary Approaches to Stop Hypertension." The DASH eating plan is a healthy eating plan that has been shown to reduce high blood pressure (hypertension). It may also reduce your risk for type 2 diabetes, heart disease, and stroke. The DASH eating plan may also help with weight loss. What are tips for following this plan? General guidelines  Avoid eating more than 2,300 mg (milligrams) of salt (sodium) a day. If you have hypertension, you may need to reduce your sodium intake to 1,500 mg a day.  Limit alcohol intake to no more than 1 drink a day for nonpregnant women and 2 drinks a day for men. One drink equals 12 oz of beer, 5 oz of wine, or 1 oz of hard liquor.  Work with your health care provider to maintain a healthy body weight or to lose weight. Ask what an ideal weight is for you.  Get at least 30 minutes of exercise that causes your heart to beat faster (aerobic exercise) most days of the week. Activities may include walking, swimming, or biking.  Work with your health care provider or diet and nutrition specialist (dietitian) to adjust your eating plan to your individual calorie needs. Reading food labels  Check food labels for the amount of sodium per serving. Choose foods with less than 5 percent of the Daily Value of sodium. Generally, foods with less than 300 mg of sodium per serving fit into this eating plan.  To find whole grains, look for the word "whole" as the first word in the ingredient list. Shopping  Buy products labeled as "low-sodium" or "no salt added."  Buy fresh foods. Avoid canned foods and premade or frozen meals. Cooking  Avoid adding salt when cooking. Use salt-free seasonings or herbs instead of table salt or sea salt. Check with your health care provider or pharmacist before using salt substitutes.  Do not fry foods. Cook foods using healthy methods such as baking, boiling, grilling, and broiling instead.  Cook with  heart-healthy oils, such as olive, canola, soybean, or sunflower oil. Meal planning   Eat a balanced diet that includes: ? 5 or more servings of fruits and vegetables each day. At each meal, try to fill half of your plate with fruits and vegetables. ? Up to 6-8 servings of whole grains each day. ? Less than 6 oz of lean meat, poultry, or fish each day. A 3-oz serving of meat is about the same size as a deck of cards. One egg equals 1 oz. ? 2 servings of low-fat dairy each day. ? A serving of nuts, seeds, or beans 5 times each week. ? Heart-healthy fats. Healthy fats called Omega-3 fatty acids are found in foods such as flaxseeds and coldwater fish, like sardines, salmon, and mackerel.  Limit how much you eat of the following: ? Canned or prepackaged foods. ? Food that is high in trans fat, such as fried foods. ? Food that is high in saturated fat, such as fatty meat. ? Sweets, desserts, sugary drinks, and other foods with added sugar. ? Full-fat dairy products.  Do not salt foods before eating.  Try to eat at least 2 vegetarian meals each week.  Eat more home-cooked food and less restaurant, buffet, and fast food.  When eating at a restaurant, ask that your food be prepared with less salt or no salt, if possible. What foods are recommended? The items listed may not be a complete list. Talk with your dietitian about what   dietary choices are best for you. Grains Whole-grain or whole-wheat bread. Whole-grain or whole-wheat pasta. Brown rice. Oatmeal. Quinoa. Bulgur. Whole-grain and low-sodium cereals. Pita bread. Low-fat, low-sodium crackers. Whole-wheat flour tortillas. Vegetables Fresh or frozen vegetables (raw, steamed, roasted, or grilled). Low-sodium or reduced-sodium tomato and vegetable juice. Low-sodium or reduced-sodium tomato sauce and tomato paste. Low-sodium or reduced-sodium canned vegetables. Fruits All fresh, dried, or frozen fruit. Canned fruit in natural juice (without  added sugar). Meat and other protein foods Skinless chicken or turkey. Ground chicken or turkey. Pork with fat trimmed off. Fish and seafood. Egg whites. Dried beans, peas, or lentils. Unsalted nuts, nut butters, and seeds. Unsalted canned beans. Lean cuts of beef with fat trimmed off. Low-sodium, lean deli meat. Dairy Low-fat (1%) or fat-free (skim) milk. Fat-free, low-fat, or reduced-fat cheeses. Nonfat, low-sodium ricotta or cottage cheese. Low-fat or nonfat yogurt. Low-fat, low-sodium cheese. Fats and oils Soft margarine without trans fats. Vegetable oil. Low-fat, reduced-fat, or light mayonnaise and salad dressings (reduced-sodium). Canola, safflower, olive, soybean, and sunflower oils. Avocado. Seasoning and other foods Herbs. Spices. Seasoning mixes without salt. Unsalted popcorn and pretzels. Fat-free sweets. What foods are not recommended? The items listed may not be a complete list. Talk with your dietitian about what dietary choices are best for you. Grains Baked goods made with fat, such as croissants, muffins, or some breads. Dry pasta or rice meal packs. Vegetables Creamed or fried vegetables. Vegetables in a cheese sauce. Regular canned vegetables (not low-sodium or reduced-sodium). Regular canned tomato sauce and paste (not low-sodium or reduced-sodium). Regular tomato and vegetable juice (not low-sodium or reduced-sodium). Pickles. Olives. Fruits Canned fruit in a light or heavy syrup. Fried fruit. Fruit in cream or butter sauce. Meat and other protein foods Fatty cuts of meat. Ribs. Fried meat. Bacon. Sausage. Bologna and other processed lunch meats. Salami. Fatback. Hotdogs. Bratwurst. Salted nuts and seeds. Canned beans with added salt. Canned or smoked fish. Whole eggs or egg yolks. Chicken or turkey with skin. Dairy Whole or 2% milk, cream, and half-and-half. Whole or full-fat cream cheese. Whole-fat or sweetened yogurt. Full-fat cheese. Nondairy creamers. Whipped toppings.  Processed cheese and cheese spreads. Fats and oils Butter. Stick margarine. Lard. Shortening. Ghee. Bacon fat. Tropical oils, such as coconut, palm kernel, or palm oil. Seasoning and other foods Salted popcorn and pretzels. Onion salt, garlic salt, seasoned salt, table salt, and sea salt. Worcestershire sauce. Tartar sauce. Barbecue sauce. Teriyaki sauce. Soy sauce, including reduced-sodium. Steak sauce. Canned and packaged gravies. Fish sauce. Oyster sauce. Cocktail sauce. Horseradish that you find on the shelf. Ketchup. Mustard. Meat flavorings and tenderizers. Bouillon cubes. Hot sauce and Tabasco sauce. Premade or packaged marinades. Premade or packaged taco seasonings. Relishes. Regular salad dressings. Where to find more information:  National Heart, Lung, and Blood Institute: www.nhlbi.nih.gov  American Heart Association: www.heart.org Summary  The DASH eating plan is a healthy eating plan that has been shown to reduce high blood pressure (hypertension). It may also reduce your risk for type 2 diabetes, heart disease, and stroke.  With the DASH eating plan, you should limit salt (sodium) intake to 2,300 mg a day. If you have hypertension, you may need to reduce your sodium intake to 1,500 mg a day.  When on the DASH eating plan, aim to eat more fresh fruits and vegetables, whole grains, lean proteins, low-fat dairy, and heart-healthy fats.  Work with your health care provider or diet and nutrition specialist (dietitian) to adjust your eating plan to your individual   calorie needs. This information is not intended to replace advice given to you by your health care provider. Make sure you discuss any questions you have with your health care provider. Document Released: 03/30/2011 Document Revised: 04/03/2016 Document Reviewed: 04/03/2016 Elsevier Interactive Patient Education  2018 [employer]  Coping with Quitting Smoking Quitting smoking is a physical and mental challenge. You will  face cravings, withdrawal symptoms, and temptation. Before quitting, work with your health care provider to make a plan that can help you cope. Preparation can help you quit and keep you from giving in. How can I cope with cravings? Cravings usually last for 5-10 minutes. If you get through it, the craving will pass. Consider taking the following actions to help you cope with cravings:  Keep your mouth busy: ? Chew sugar-free gum. ? Suck on hard candies or a straw. ? Brush your teeth.  Keep your hands and body busy: ? Immediately change to a different activity when you feel a craving. ? Squeeze or play with a ball. ? Do an activity or a hobby, like making bead jewelry, practicing needlepoint, or working with wood. ? Mix up your normal routine. ? Take a short exercise break. Go for a quick walk or run up and down stairs. ? Spend time in public places where smoking is not allowed.  Focus on doing something kind or helpful for someone else.  Call a friend or family member to talk during a craving.  Join a support group.  Call a quit line, such as 1-800-QUIT-NOW.  Talk with your health care provider about medicines that might help you cope with cravings and make quitting easier for you.  How can I deal with withdrawal symptoms? Your body may experience negative effects as it tries to get used to not having nicotine in the system. These effects are called withdrawal symptoms. They may include:  Feeling hungrier than normal.  Trouble concentrating.  Irritability.  Trouble sleeping.  Feeling depressed.  Restlessness and agitation.  Craving a cigarette.  To manage withdrawal symptoms:  Avoid places, people, and activities that trigger your cravings.  Remember why you want to quit.  Get plenty of sleep.  Avoid coffee and other caffeinated drinks. These may worsen some of your symptoms.  How can I handle social situations? Social situations can be difficult when you are  quitting smoking, especially in the first few weeks. To manage this, you can:  Avoid parties, bars, and other social situations where people might be smoking.  Avoid alcohol.  Leave right away if you have the urge to smoke.  Explain to your family and friends that you are quitting smoking. Ask for understanding and support.  Plan activities with friends or family where smoking is not an option.  What are some ways I can cope with stress? Wanting to smoke may cause stress, and stress can make you want to smoke. Find ways to manage your stress. Relaxation techniques can help. For example:  Breathe slowly and deeply, in through your nose and out through your mouth.  Listen to soothing, relaxing music.  Talk with a family member or friend about your stress.  Light a candle.  Soak in a bath or take a shower.  Think about a peaceful place.  What are some ways I can prevent weight gain? Be aware that many people gain weight after they quit smoking. However, not everyone does. To keep from gaining weight, have a plan in place before you quit and stick to   the plan after you quit. Your plan should include:  Having healthy snacks. When you have a craving, it may help to: ? Eat plain popcorn, crunchy carrots, celery, or other cut vegetables. ? Chew sugar-free gum.  Changing how you eat: ? Eat small portion sizes at meals. ? Eat 4-6 small meals throughout the day instead of 1-2 large meals a day. ? Be mindful when you eat. Do not watch television or do other things that might distract you as you eat.  Exercising regularly: ? Make time to exercise each day. If you do not have time for a long workout, do short bouts of exercise for 5-10 minutes several times a day. ? Do some form of strengthening exercise, like weight lifting, and some form of aerobic exercise, like running or swimming.  Drinking plenty of water or other low-calorie or no-calorie drinks. Drink 6-8 glasses of water daily,  or as much as instructed by your health care provider.  Summary  Quitting smoking is a physical and mental challenge. You will face cravings, withdrawal symptoms, and temptation to smoke again. Preparation can help you as you go through these challenges.  You can cope with cravings by keeping your mouth busy (such as by chewing gum), keeping your body and hands busy, and making calls to family, friends, or a helpline for people who want to quit smoking.  You can cope with withdrawal symptoms by avoiding places where people smoke, avoiding drinks with caffeine, and getting plenty of rest.  Ask your health care provider about the different ways to prevent weight gain, avoid stress, and handle social situations. This information is not intended to replace advice given to you by your health care provider. Make sure you discuss any questions you have with your health care provider. Document Released: 04/07/2016 Document Revised: 04/07/2016 Document Reviewed: 04/07/2016 Elsevier Interactive Patient Education  2018 [employer]  
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
## 2017-06-26 ENCOUNTER — Inpatient Hospital Stay: Payer: 59 | Admitting: Family Medicine

## 2017-06-28 ENCOUNTER — Encounter: Payer: Self-pay | Admitting: Family Medicine

## 2017-06-28 ENCOUNTER — Ambulatory Visit (INDEPENDENT_AMBULATORY_CARE_PROVIDER_SITE_OTHER): Payer: 59 | Admitting: Family Medicine

## 2017-06-28 VITALS — BP 130/76 | HR 88 | Temp 98.8°F | Wt 138.5 lb

## 2017-06-28 DIAGNOSIS — F411 Generalized anxiety disorder: Secondary | ICD-10-CM | POA: Diagnosis not present

## 2017-06-28 DIAGNOSIS — R079 Chest pain, unspecified: Secondary | ICD-10-CM | POA: Diagnosis not present

## 2017-06-28 DIAGNOSIS — F17200 Nicotine dependence, unspecified, uncomplicated: Secondary | ICD-10-CM

## 2017-06-28 DIAGNOSIS — Z23 Encounter for immunization: Secondary | ICD-10-CM | POA: Diagnosis not present

## 2017-06-28 DIAGNOSIS — Z789 Other specified health status: Secondary | ICD-10-CM

## 2017-06-28 MED ORDER — CITALOPRAM HYDROBROMIDE 10 MG PO TABS: 10.0000 mg | ORAL_TABLET | Freq: Every day | ORAL | 3 refills | Status: DC

## 2017-06-28 MED ORDER — NICOTINE 14 MG/24HR TD PT24: 14.0000 mg | MEDICATED_PATCH | Freq: Every day | TRANSDERMAL | 1 refills | Status: DC

## 2017-06-28 NOTE — Assessment & Plan Note (Signed)
Ongoing anxiety, pt endorses long history of this, reviewed largely due to work related stressors, discussed medications. Will start celexa 10mg  daily, RTC 1-2 mo recheck. Pt agrees with plan.  rec against benzo given EtOH history.  PHQ9 = 2 GAD7 = 8

## 2017-06-28 NOTE — Assessment & Plan Note (Signed)
Encouraged ongoing attempts at cutting back.

## 2017-06-28 NOTE — Assessment & Plan Note (Signed)
Reviewed workup with patient - nonobstructive CAD. EF 45-50%. Recommended lifestyle modifications. He is working on smoking cessation.

## 2017-06-28 NOTE — Progress Notes (Addendum)
BP 130/76 (BP Location: Right Arm, Patient Position: Sitting, Cuff Size: Normal)   Pulse 88   Temp 98.8 F (37.1 C) (Oral)   Wt 138 lb 8 oz (62.8 kg)   SpO2 96%   BMI 20.45 kg/m    CC: hosp f/u visit Subjective:    Patient ID: [name], male    DOB: 03-03-1971, 47 y.o.   MRN: [id number]  HPI: [name] is a 47 y.o. male presenting on 06/28/2017 for Hospitalization Follow-up (Admitted to [facility] on 06/13/17 dx, acute chest pain. No more chest pain. Feels anxious on the way here this morning. Sx of heart attack have went away.) and Immunizations (Pt asking about Pnuemovax.)   Recent hospitalization for acute acute substernal chest pain associated with dizziness. TnI was elevated, T wave inversions on EKG, underwent catheterization 2/21 showing no significant CAD. LV ventriculography showed ectopy and EF 45-50%. Cardiology did not recommend further intervention at this time. Advised to decrease alcohol and smoking cessation. Had no reproducible chest wall pain.   4-5 beers on week days, cutting down on weekends (prior binging).  Endorsing increased anxiety today - due to work stressors. Ongoing work related stress with overwhelmed feelings. Works on [employer] site. Denies any depression.   Hand pain/swelling has improved but mild discomfort persists.   Of note, UDS tested positive for benzo. Pt states he rarely takes GF's xanax to sleep. GF gets it from a friend. Advised against taking someone else's medication, discussed concerns with habit forming nature of benzo.   Admit date:06/13/2017 Discharge date:06/14/2017 TCM f/u phone call completed 06/18/2017  Admitted From:home Disposition:Home  Recommendations for Outpatient Follow-up: 1. Follow up with PCP in 1-2 weeks  Discharge Condition:stable CODE STATUS:Full code Diet recommendation:regular  Relevant past medical, surgical, family and social history reviewed and updated as indicated. Interim medical  history since our last visit reviewed. Allergies and medications reviewed and updated. Outpatient Medications Prior to Visit  Medication Sig Dispense Refill  . naproxen (NAPROSYN) 500 MG tablet TAKE 1 TABLET BY MOUTH TWICE DAILY FOR 1 WEEK THEN AS NEEDED FOR PAIN, TAKE WITH FOOD 60 tablet 0  . nicotine (NICODERM CQ - DOSED IN MG/24 HOURS) 21 mg/24hr patch Place 1 patch (21 mg total) onto the skin daily. 28 patch 0   No facility-administered medications prior to visit.      Per HPI unless specifically indicated in ROS section below Review of Systems     Objective:    BP 130/76 (BP Location: Right Arm, Patient Position: Sitting, Cuff Size: Normal)   Pulse 88   Temp 98.8 F (37.1 C) (Oral)   Wt 138 lb 8 oz (62.8 kg)   SpO2 96%   BMI 20.45 kg/m   Wt Readings from Last 3 Encounters:  06/28/17 138 lb 8 oz (62.8 kg)  06/25/17 137 lb (62.1 kg)  06/14/17 133 lb 1.6 oz (60.4 kg)    Physical Exam  Constitutional: He appears well-developed and well-nourished. No distress.  HENT:  Mouth/Throat: Oropharynx is clear and moist. No oropharyngeal exudate.  Eyes: Conjunctivae are normal. Pupils are equal, round, and reactive to light.  Cardiovascular: Normal rate, regular rhythm, normal heart sounds and intact distal pulses.  No murmur heard. Pulmonary/Chest: Effort normal and breath sounds normal. No respiratory distress. He has no wheezes. He has no rales.  Skin: Skin is warm and dry. No rash noted.  Psychiatric: His mood appears anxious.  Nursing note and vitals reviewed.  Cardiac catheterization 2/21  Right  dominant coronary anatomy.  Normal left main.  Normal LAD with dominant second diagonal branch.  Normal circumflex coronary artery.  Normal right coronary artery.  Ventricular ectopy presented precise assessment of systolic contractility. Pattern suggests low normal to mild reduction in EF in the 45-50% range. LVEDP was normal. RECOMMENDATIONS:  No critical coronary  disease is identified.  Suggestion of low normal to mildly depressed LV function although ventriculography was poor quality.  Risk factor modification including reduction of alcohol intake, smoking cessation, and improved health care follow-up.     Assessment & Plan:   Problem List Items Addressed This Visit    Chest pain in adult - Primary    Reviewed workup with patient - nonobstructive CAD. EF 45-50%. Recommended lifestyle modifications. He is working on smoking cessation.       GAD (generalized anxiety disorder)    Ongoing anxiety, pt endorses long history of this, reviewed largely due to work related stressors, discussed medications. Will start celexa 10mg  daily, RTC 1-2 mo recheck. Pt agrees with plan.  rec against benzo given EtOH history.  PHQ9 = 2 GAD7 = 8      Relevant Medications   citalopram (CELEXA) 10 MG tablet   Regular alcohol consumption    Encouraged ongoing attempts at cutting back.       TOBACCO ABUSE    Encouraged full cessation. Reviewed nicotine replacement dosing - may be too high for him, suggested trial 14mg  patch - sent in.  Pneumovax provided today - pt endorses prior h/o PNA. >5 minutes spent on tobacco cessation counseling. Offered f/u - he will return or notify us if further assistance needed       Other Visit Diagnoses    Need for 23-polyvalent pneumococcal polysaccharide vaccine       Relevant Orders   Pneumococcal polysaccharide vaccine 23-valent greater than or equal to 2yo subcutaneous/IM (Completed)       Meds ordered this encounter  Medications  . nicotine (NICODERM CQ - DOSED IN MG/24 HOURS) 14 mg/24hr patch    Sig: Place 1 patch (14 mg total) onto the skin daily.    Dispense:  28 patch    Refill:  1  . citalopram (CELEXA) 10 MG tablet    Sig: Take 1 tablet (10 mg total) by mouth daily.    Dispense:  30 tablet    Refill:  3   Orders Placed This Encounter  Procedures  . Pneumococcal polysaccharide vaccine 23-valent greater  than or equal to 2yo subcutaneous/IM    Follow up plan: Return in about 2 months (around 08/28/2017), or if symptoms worsen or fail to improve, for follow up visit.  Ria Bush, MD

## 2017-06-28 NOTE — Assessment & Plan Note (Addendum)
Encouraged full cessation. Reviewed nicotine replacement dosing - may be too high for him, suggested trial 14mg  patch - sent in.  Pneumovax provided today - pt endorses prior h/o PNA. >5 minutes spent on tobacco cessation counseling. Offered f/u - he will return or notify us if further assistance needed

## 2017-06-28 NOTE — Patient Instructions (Addendum)
Try celexa 10mg  daily for anxiety.  Hopefully as anxiety gets better controlled, we will be able to more easily quit smoking. Continue nicotine patches for now.  Try to cut down on nicotine patch dose to 14 mg.  Return in 1-2 months for follow up visit on anxiety Pneumovax today.

## 2017-07-24 ENCOUNTER — Other Ambulatory Visit: Payer: Self-pay | Admitting: Family Medicine

## 2017-08-22 ENCOUNTER — Ambulatory Visit: Payer: 59 | Admitting: Family Medicine

## 2017-08-22 DIAGNOSIS — Z0289 Encounter for other administrative examinations: Secondary | ICD-10-CM

## 2017-09-07 ENCOUNTER — Other Ambulatory Visit: Payer: Self-pay | Admitting: Family Medicine

## 2017-09-07 NOTE — Telephone Encounter (Signed)
Last filled:  07/26/17, #60 Last OV:  06/28/17 Next OV:  none

## 2017-10-07 ENCOUNTER — Other Ambulatory Visit: Payer: Self-pay | Admitting: Family Medicine

## 2017-10-08 NOTE — Telephone Encounter (Signed)
Electronic refill request Last office visit 06/28/17 Last refill 09/07/17 #60

## 2017-11-15 ENCOUNTER — Emergency Department (HOSPITAL_COMMUNITY)
Admission: EM | Admit: 2017-11-15 | Discharge: 2017-11-15 | Discharge status: Against Medical Advice | Payer: 59 | Source: Home / Self Care

## 2017-11-15 ENCOUNTER — Encounter (HOSPITAL_COMMUNITY): Payer: Self-pay

## 2017-11-15 ENCOUNTER — Other Ambulatory Visit: Payer: Self-pay

## 2017-11-15 ENCOUNTER — Emergency Department (HOSPITAL_COMMUNITY)
Admission: EM | Admit: 2017-11-15 | Discharge: 2017-11-15 | Disposition: A | Discharge status: Home / Self Care | Payer: 59 | Attending: Emergency Medicine | Admitting: Emergency Medicine

## 2017-11-15 DIAGNOSIS — Y929 Unspecified place or not applicable: Secondary | ICD-10-CM | POA: Insufficient documentation

## 2017-11-15 DIAGNOSIS — T1592XA Foreign body on external eye, part unspecified, left eye, initial encounter: Secondary | ICD-10-CM | POA: Diagnosis not present

## 2017-11-15 DIAGNOSIS — Z23 Encounter for immunization: Secondary | ICD-10-CM | POA: Diagnosis not present

## 2017-11-15 DIAGNOSIS — W458XXA Other foreign body or object entering through skin, initial encounter: Secondary | ICD-10-CM | POA: Insufficient documentation

## 2017-11-15 DIAGNOSIS — S058X2A Other injuries of left eye and orbit, initial encounter: Secondary | ICD-10-CM | POA: Insufficient documentation

## 2017-11-15 DIAGNOSIS — Y990 Civilian activity done for income or pay: Secondary | ICD-10-CM | POA: Diagnosis not present

## 2017-11-15 DIAGNOSIS — F17210 Nicotine dependence, cigarettes, uncomplicated: Secondary | ICD-10-CM | POA: Diagnosis not present

## 2017-11-15 DIAGNOSIS — Z5321 Procedure and treatment not carried out due to patient leaving prior to being seen by health care provider: Secondary | ICD-10-CM | POA: Insufficient documentation

## 2017-11-15 DIAGNOSIS — Z79899 Other long term (current) drug therapy: Secondary | ICD-10-CM | POA: Diagnosis not present

## 2017-11-15 DIAGNOSIS — J449 Chronic obstructive pulmonary disease, unspecified: Secondary | ICD-10-CM | POA: Diagnosis not present

## 2017-11-15 DIAGNOSIS — H18892 Other specified disorders of cornea, left eye: Secondary | ICD-10-CM | POA: Insufficient documentation

## 2017-11-15 DIAGNOSIS — Y9389 Activity, other specified: Secondary | ICD-10-CM | POA: Insufficient documentation

## 2017-11-15 DIAGNOSIS — I10 Essential (primary) hypertension: Secondary | ICD-10-CM | POA: Diagnosis not present

## 2017-11-15 DIAGNOSIS — H5712 Ocular pain, left eye: Secondary | ICD-10-CM | POA: Insufficient documentation

## 2017-11-15 DIAGNOSIS — I252 Old myocardial infarction: Secondary | ICD-10-CM | POA: Diagnosis not present

## 2017-11-15 MED ORDER — TETRACAINE HCL 0.5 % OP SOLN
2.0000 [drp] | Freq: Once | OPHTHALMIC | Status: AC
  Administered 2017-11-15: 2 [drp] via OPHTHALMIC
  Filled 2017-11-15: qty 4

## 2017-11-15 MED ORDER — TETANUS-DIPHTH-ACELL PERTUSSIS 5-2.5-18.5 LF-MCG/0.5 IM SUSP
0.5000 mL | Freq: Once | INTRAMUSCULAR | Status: AC
  Administered 2017-11-15: 0.5 mL via INTRAMUSCULAR
  Filled 2017-11-15: qty 0.5

## 2017-11-15 MED ORDER — FLUORESCEIN SODIUM 1 MG OP STRP
1.0000 | ORAL_STRIP | Freq: Once | OPHTHALMIC | Status: AC
  Administered 2017-11-15: 1 via OPHTHALMIC
  Filled 2017-11-15: qty 1

## 2017-11-15 MED ORDER — MOXIFLOXACIN HCL 0.5 % OP SOLN: 1.0000 [drp] | Freq: Three times a day (TID) | OPHTHALMIC | 0 refills | Status: AC

## 2017-11-15 NOTE — ED Provider Notes (Signed)
MSE was initiated and I personally evaluated the patient and placed orders (if any) at  4:44 PM on November 15, 2017.  The patient appears stable so that the remainder of the MSE may be completed by another provider.  Patient placed in Quick Look pathway, seen and evaluated   Chief Complaint: left eye pain, foreign body sensation  HPI:   47 year old male presents to ED for evaluation of left eye redness, clear drainage and foreign body sensation since yesterday.  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  He and his coworkers tried to flush out the eye with no improvement.  He has not taken any medicine help with the pain.  Denies any contact lens use but does wear glasses.  Eyes any vision changes, pain with EOMs, visual field deficits, swelling around eye.  ROS: Left eye pain  Physical Exam:   Gen: No distress  Neuro: Awake and Alert  Skin: Warm    Focused Exam: Clear, tearful drainage noted of the left eye.  Conjunctival injection noted. PERRL. No proptosis, foreign bodies noted.   Initiation of care has begun. The patient has been counseled on the process, plan, and necessity for staying for the completion/evaluation, and the remainder of the medical screening examination    Delia Heady, PA-C 11/15/17 [place], Julie, MD 11/15/17 1733

## 2017-11-15 NOTE — ED Notes (Signed)
Pt became very agitated in waiting area, states that he is leaving due to wait times

## 2017-11-15 NOTE — ED Notes (Signed)
Declined W/C at D/C and was escorted to lobby by RN. 

## 2017-11-15 NOTE — Discharge Instructions (Signed)
You are seen in the emergency department today for irritation to your left eye.  It appears that there is something in your eye, we were unable to get this out in the emergency department, we would like you to go to the ophthalmology clinic tomorrow at anytime during office hours to have this removed.  The clinic information is provided in your discharge instructions, this is the [facility] clinic.  In the meantime we would like you to use antibiotic drops, Vigamox, 3 times per day.  Return to the ER anytime for new or worsening symptoms including but not limited to increased pain, inability to move your eye, fevers, change in your vision, or any other concerns.

## 2017-11-15 NOTE — ED Triage Notes (Signed)
Pt just left two hours ago for L eye pain, states pain, redness and swelling are worse.
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
## 2018-01-19 ENCOUNTER — Emergency Department (HOSPITAL_COMMUNITY)
Admission: EM | Admit: 2018-01-19 | Discharge: 2018-01-19 | Disposition: A | Discharge status: Home / Self Care | Payer: 59 | Attending: Emergency Medicine | Admitting: Emergency Medicine

## 2018-01-19 ENCOUNTER — Other Ambulatory Visit: Payer: Self-pay

## 2018-01-19 DIAGNOSIS — T7840XA Allergy, unspecified, initial encounter: Secondary | ICD-10-CM | POA: Insufficient documentation

## 2018-01-19 DIAGNOSIS — Z79899 Other long term (current) drug therapy: Secondary | ICD-10-CM | POA: Diagnosis not present

## 2018-01-19 DIAGNOSIS — I10 Essential (primary) hypertension: Secondary | ICD-10-CM | POA: Insufficient documentation

## 2018-01-19 DIAGNOSIS — F17210 Nicotine dependence, cigarettes, uncomplicated: Secondary | ICD-10-CM | POA: Diagnosis not present

## 2018-01-19 DIAGNOSIS — L299 Pruritus, unspecified: Secondary | ICD-10-CM | POA: Diagnosis present

## 2018-01-19 MED ORDER — SODIUM CHLORIDE 0.9 % IV SOLN
40.0000 mg | Freq: Once | INTRAVENOUS | Status: AC
  Administered 2018-01-19: 40 mg via INTRAVENOUS
  Filled 2018-01-19: qty 4

## 2018-01-19 MED ORDER — EPINEPHRINE 0.3 MG/0.3ML IJ SOAJ
0.3000 mg | Freq: Once | INTRAMUSCULAR | Status: AC
  Administered 2018-01-19: 0.3 mg via INTRAMUSCULAR

## 2018-01-19 MED ORDER — EPINEPHRINE 0.3 MG/0.3ML IJ SOAJ: 0.3000 mg | Freq: Every day | INTRAMUSCULAR | 0 refills | Status: DC | PRN

## 2018-01-19 MED ORDER — EPINEPHRINE 0.3 MG/0.3ML IJ SOAJ
INTRAMUSCULAR | Status: AC
  Filled 2018-01-19: qty 0.3

## 2018-01-19 MED ORDER — METHYLPREDNISOLONE SODIUM SUCC 125 MG IJ SOLR
125.0000 mg | Freq: Once | INTRAMUSCULAR | Status: AC
  Administered 2018-01-19: 125 mg via INTRAVENOUS
  Filled 2018-01-19: qty 2
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
## 2018-01-19 NOTE — ED Triage Notes (Signed)
Pt states that he was stung 30 minutes ago by a bee, itching, feels his throat is swelling, trouble swallowing, hives, took two benadryl PTA, facial swelling

## 2018-01-19 NOTE — ED Provider Notes (Signed)
Assumed care from Dr. Wilson Singer at shift change.  See prior notes for full H&P.  Briefly, 47 y.o. M here after being stung by something at home.  Developed hives and sensation of throat closing here.  Treated with epinephrine, benadryl, pepcid, and solu-medrol with improvement.  Plan:  Observe until 2330.  Can discharge if still doing well.   11:27 PM Patient continues doing well.  No airway compromise, states he has been back at baseline since receiving medications.  Vitals are stable.  Feel he stable for discharge home.  Will follow closely with primary care doctor.  He will return here for any new or worsening symptoms.    Larene Pickett, PA-C 01/19/18 4801    Merryl Hacker, MD 01/21/18 [phone]

## 2018-03-11 ENCOUNTER — Encounter: Payer: Self-pay | Admitting: Family Medicine

## 2018-03-11 ENCOUNTER — Ambulatory Visit (INDEPENDENT_AMBULATORY_CARE_PROVIDER_SITE_OTHER): Payer: 59 | Admitting: Family Medicine

## 2018-03-11 VITALS — BP 124/86 | HR 78 | Temp 98.6°F | Ht 68.5 in | Wt 138.2 lb

## 2018-03-11 DIAGNOSIS — F17200 Nicotine dependence, unspecified, uncomplicated: Secondary | ICD-10-CM

## 2018-03-11 DIAGNOSIS — R42 Dizziness and giddiness: Secondary | ICD-10-CM | POA: Diagnosis not present

## 2018-03-11 MED ORDER — MECLIZINE HCL 25 MG PO TABS: 25.0000 mg | ORAL_TABLET | Freq: Two times a day (BID) | ORAL | 0 refills | Status: DC | PRN

## 2018-03-11 NOTE — Patient Instructions (Addendum)
Return for physical at your convenience. I think you have positional vertigo or inner ear trouble.  Avoid sudden head movements. Try meclizine as needed.  Make sure you stay well hydrated.  If worsening symptoms, let us know or seek urgent care.   Benign Positional Vertigo Vertigo is the feeling that you or your surroundings are moving when they are not. Benign positional vertigo is the most common form of vertigo. The cause of this condition is not serious (is benign). This condition is triggered by certain movements and positions (is positional). This condition can be dangerous if it occurs while you are doing something that could endanger you or others, such as driving. What are the causes? In many cases, the cause of this condition is not known. It may be caused by a disturbance in an area of the inner ear that helps your brain to sense movement and balance. This disturbance can be caused by a viral infection (labyrinthitis), head injury, or repetitive motion. What increases the risk? This condition is more likely to develop in:  Women.  People who are 49 years of age or older.  What are the signs or symptoms? Symptoms of this condition usually happen when you move your head or your eyes in different directions. Symptoms may start suddenly, and they usually last for less than a minute. Symptoms may include:  Loss of balance and falling.  Feeling like you are spinning or moving.  Feeling like your surroundings are spinning or moving.  Nausea and vomiting.  Blurred vision.  Dizziness.  Involuntary eye movement (nystagmus).  Symptoms can be mild and cause only slight annoyance, or they can be severe and interfere with daily life. Episodes of benign positional vertigo may return (recur) over time, and they may be triggered by certain movements. Symptoms may improve over time. How is this diagnosed? This condition is usually diagnosed by medical history and a physical exam of the  head, neck, and ears. You may be referred to a health care provider who specializes in ear, nose, and throat (ENT) problems (otolaryngologist) or a provider who specializes in disorders of the nervous system (neurologist). You may have additional testing, including:  MRI.  A CT scan.  Eye movement tests. Your health care provider may ask you to change positions quickly while he or she watches you for symptoms of benign positional vertigo, such as nystagmus. Eye movement may be tested with an electronystagmogram (ENG), caloric stimulation, the Dix-Hallpike test, or the roll test.  An electroencephalogram (EEG). This records electrical activity in your brain.  Hearing tests.  How is this treated? Usually, your health care provider will treat this by moving your head in specific positions to adjust your inner ear back to normal. Surgery may be needed in severe cases, but this is rare. In some cases, benign positional vertigo may resolve on its own in 2-4 weeks. Follow these instructions at home: Safety  Move slowly.Avoid sudden body or head movements.  Avoid driving.  Avoid operating heavy machinery.  Avoid doing any tasks that would be dangerous to you or others if a vertigo episode would occur.  If you have trouble walking or keeping your balance, try using a cane for stability. If you feel dizzy or unstable, sit down right away.  Return to your normal activities as told by your health care provider. Ask your health care provider what activities are safe for you. General instructions  Take over-the-counter and prescription medicines only as told by your health care  provider.  Avoid certain positions or movements as told by your health care provider.  Drink enough fluid to keep your urine clear or pale yellow.  Keep all follow-up visits as told by your health care provider. This is important. Contact a health care provider if:  You have a fever.  Your condition gets worse or  you develop new symptoms.  Your family or friends notice any behavioral changes.  Your nausea or vomiting gets worse.  You have numbness or a "pins and needles" sensation. Get help right away if:  You have difficulty speaking or moving.  You are always dizzy.  You faint.  You develop severe headaches.  You have weakness in your legs or arms.  You have changes in your hearing or vision.  You develop a stiff neck.  You develop sensitivity to light. This information is not intended to replace advice given to you by your health care provider. Make sure you discuss any questions you have with your health care provider. Document Released: 01/16/2006 Document Revised: 09/16/2015 Document Reviewed: 08/03/2014 Elsevier Interactive Patient Education  [employer].

## 2018-03-11 NOTE — Assessment & Plan Note (Signed)
Anticipate benign peripheral positional vertigo. rec treat with meclizine 1/2-1 tab PRN (sedtaion precautions discussed).  Discussed risk of stroke in smoking history, reviewed red flags to seek further or urgent care. Pt agrees with plan.

## 2018-03-11 NOTE — Assessment & Plan Note (Signed)
Continue to encourage cessation. Has tried NRT in the past (patches).

## 2018-03-11 NOTE — Progress Notes (Signed)
BP 124/86 (BP Location: Left Arm, Patient Position: Sitting, Cuff Size: Normal)   Pulse 78   Temp 98.6 F (37 C) (Oral)   Ht 5' 8.5" (1.74 m)   Wt 138 lb 4 oz (62.7 kg)   SpO2 98%   BMI 20.72 kg/m   Orthostatic VS for the past 24 hrs (Last 3 readings):  BP- Lying BP- Standing at 0 minutes  03/11/18 1127 - 124/86  03/11/18 1125 130/82 -    CC: dizziness Subjective:    Patient ID: [name], male    DOB: [birth year], 47 y.o.   MRN: [id number]  HPI: [name] is a 47 y.o. male presenting on 03/11/2018 for Dizziness (C/o dizziness that started this morninig at work about about 8:00. Says it first started when he went to stand up form a squatted position. Then it occurred when walking. States he feels better now. ) and Hand Pain (C/o right hand pain, usually stays around a #5 on scale. )   1d h/o dizziness when going from squatting position to standing. This was associated with mild nausea. Also felt dizzy while walking at work (ie [place]). Dizziness described as room spinning and head swaying. No presyncope. Staying lightheaded. No hearing changes, recent URI symptoms, tinnitus.   Feels he's staying well hydrated, good meal last night, had a good night's sleep.  H/o anemia. Denies blood in stool or urine.  Persistent R 3rd knuckle pain.   He did take [facility] arthritis today for worsening joint pains in cold weather.   Current smoker 1ppd. Has tried nicotine patches but they don't help with desire.  celexa caused sexual dysfunction.   Relevant past medical, surgical, family and social history reviewed and updated as indicated. Interim medical history since our last visit reviewed. Allergies and medications reviewed and updated. Outpatient Medications Prior to Visit  Medication Sig Dispense Refill  . Aspirin-Salicylamide-Caffeine (ARTHRITIS STRENGTH BC POWDER PO) Take 1 packet by mouth 2 (two) times daily as needed (for pain).    Marland Kitchen EPINEPHrine 0.3 mg/0.3 mL IJ SOAJ  injection Inject 0.3 mLs (0.3 mg total) into the muscle daily as needed. 2 Device 0  . naproxen (NAPROSYN) 500 MG tablet TAKE 1 TABLET BY MOUTH TWICE DAILY FOR 1 WEEK THEN AS NEEDED FOR PAIN, TAKE WITH FOOD (Patient taking differently: Take 500 mg by mouth daily as needed (for hand/joint pain). ) 60 tablet 0  . nicotine (NICODERM CQ - DOSED IN MG/24 HOURS) 14 mg/24hr patch Place 1 patch (14 mg total) onto the skin daily. (Patient not taking: Reported on 01/19/2018) 28 patch 1  . citalopram (CELEXA) 10 MG tablet Take 1 tablet (10 mg total) by mouth daily. (Patient not taking: Reported on 01/19/2018) 30 tablet 3  . diphenhydrAMINE (BENADRYL) 25 mg capsule Take 25-50 mg by mouth as needed (for stings, allergic reactions, or allergies).     No facility-administered medications prior to visit.      Per HPI unless specifically indicated in ROS section below Review of Systems     Objective:    BP 124/86 (BP Location: Left Arm, Patient Position: Sitting, Cuff Size: Normal)   Pulse 78   Temp 98.6 F (37 C) (Oral)   Ht 5' 8.5" (1.74 m)   Wt 138 lb 4 oz (62.7 kg)   SpO2 98%   BMI 20.72 kg/m   Wt Readings from Last 3 Encounters:  03/11/18 138 lb 4 oz (62.7 kg)  11/15/17 140 lb (63.5 kg)  06/28/17 138 lb 8 oz (62.8 kg)    Physical Exam  Constitutional: He appears well-developed and well-nourished. No distress.  HENT:  Mouth/Throat: Oropharynx is clear and moist. No oropharyngeal exudate.  Eyes: Pupils are equal, round, and reactive to light. EOM are normal.  Haziness L cornea at site of prior metal  Cardiovascular: Normal rate, regular rhythm and normal heart sounds.  No murmur heard. Pulmonary/Chest: Effort normal and breath sounds normal. No respiratory distress. He has no wheezes. He has no rales.  Musculoskeletal: Normal range of motion.  Neurological: He is alert. He has normal strength. No cranial nerve deficit or sensory deficit. He displays a negative Romberg sign. Coordination and  gait normal.  CN 2-12 intact FTN intact EOMI Mild symptoms with dix hallpike to left and return to sitting position  Psychiatric: He has a normal mood and affect.  Nursing note and vitals reviewed.     Assessment & Plan:   Problem List Items Addressed This Visit    Vertigo - Primary    Anticipate benign peripheral positional vertigo. rec treat with meclizine 1/2-1 tab PRN (sedtaion precautions discussed).  Discussed risk of stroke in smoking history, reviewed red flags to seek further or urgent care. Pt agrees with plan.       Smoker    Continue to encourage cessation. Has tried NRT in the past (patches).           Meds ordered this encounter  Medications  . meclizine (ANTIVERT) 25 MG tablet    Sig: Take 1 tablet (25 mg total) by mouth 2 (two) times daily as needed for dizziness (sedation precautions).    Dispense:  30 tablet    Refill:  0   No orders of the defined types were placed in this encounter.   Follow up plan: Return if symptoms worsen or fail to improve.  Ria Bush, MD

## 2018-03-15 ENCOUNTER — Ambulatory Visit (INDEPENDENT_AMBULATORY_CARE_PROVIDER_SITE_OTHER): Payer: 59 | Admitting: Family Medicine

## 2018-03-15 ENCOUNTER — Encounter: Payer: Self-pay | Admitting: Family Medicine

## 2018-03-15 ENCOUNTER — Encounter: Payer: Self-pay | Admitting: Gastroenterology

## 2018-03-15 VITALS — BP 128/76 | HR 75 | Temp 98.2°F | Ht 67.25 in | Wt 135.0 lb

## 2018-03-15 DIAGNOSIS — I10 Essential (primary) hypertension: Secondary | ICD-10-CM

## 2018-03-15 DIAGNOSIS — Z8679 Personal history of other diseases of the circulatory system: Secondary | ICD-10-CM

## 2018-03-15 DIAGNOSIS — Z0000 Encounter for general adult medical examination without abnormal findings: Secondary | ICD-10-CM | POA: Diagnosis not present

## 2018-03-15 DIAGNOSIS — F17200 Nicotine dependence, unspecified, uncomplicated: Secondary | ICD-10-CM

## 2018-03-15 DIAGNOSIS — R1310 Dysphagia, unspecified: Secondary | ICD-10-CM

## 2018-03-15 DIAGNOSIS — M79641 Pain in right hand: Secondary | ICD-10-CM | POA: Diagnosis not present

## 2018-03-15 DIAGNOSIS — F1090 Alcohol use, unspecified, uncomplicated: Secondary | ICD-10-CM

## 2018-03-15 DIAGNOSIS — Z7289 Other problems related to lifestyle: Secondary | ICD-10-CM | POA: Diagnosis not present

## 2018-03-15 LAB — COMPREHENSIVE METABOLIC PANEL
ALK PHOS: 48 U/L (ref 39–117)
ALT: 18 U/L (ref 0–53)
AST: 20 U/L (ref 0–37)
Albumin: 4.7 g/dL (ref 3.5–5.2)
BUN: 21 mg/dL (ref 6–23)
CHLORIDE: 107 meq/L (ref 96–112)
CO2: 29 meq/L (ref 19–32)
Calcium: 9.5 mg/dL (ref 8.4–10.5)
Creatinine, Ser: 0.79 mg/dL (ref 0.40–1.50)
GFR: 110.97 mL/min (ref >60.00)
GLUCOSE: 97 mg/dL (ref 70–99)
Potassium: 4.9 mEq/L (ref 3.5–5.1)
SODIUM: 141 meq/L (ref 135–145)
Total Bilirubin: 0.4 mg/dL (ref 0.2–1.2)
Total Protein: 7.3 g/dL (ref 6.0–8.3)

## 2018-03-15 LAB — LIPID PANEL
CHOL/HDL RATIO: 4
Cholesterol: 223 mg/dL — ABNORMAL HIGH (ref 0–200)
HDL: 62 mg/dL (ref >39.00)
LDL CALC: 148 mg/dL — AB (ref 0–99)
NONHDL: 160.64
Triglycerides: 61 mg/dL (ref 0.0–149.0)
VLDL: 12.2 mg/dL (ref 0.0–40.0)

## 2018-03-15 NOTE — Assessment & Plan Note (Addendum)
Continue to encourage decreased alcohol use. Reviewed risk of longterm alcohol use.

## 2018-03-15 NOTE — Assessment & Plan Note (Signed)
Encouraged cessation. Precontemplative. 

## 2018-03-15 NOTE — Assessment & Plan Note (Signed)
Preventative protocols reviewed and updated unless pt declined. Discussed healthy diet and lifestyle.  

## 2018-03-15 NOTE — Progress Notes (Signed)
BP 128/76 (BP Location: Left Arm, Patient Position: Sitting, Cuff Size: Normal)   Pulse 75   Temp 98.2 F (36.8 C) (Oral)   Ht 5' 7.25" (1.708 m)   Wt 135 lb (61.2 kg)   SpO2 94%   BMI 20.99 kg/m    CC: CPE Subjective:    Patient ID: [name], male    DOB: 1970-06-11, 47 y.o.   MRN: [id number]  HPI: [name] is a 47 y.o. male presenting on 03/15/2018 for Annual Exam   Recent vertigo managed with meclizine pills with benefit.  Ongoing R 3rd MCPJ pain/swelling, can we severe, lasts 10 min at a time. No redness or warmth. Hasn't seen hand surgeon. xrays have returned ok. Naprosyn is helpful, but temporary. Regularly takes BC powder for hand pain. S/p R wrist CTR 2013   Ongoing dysphagia, happens daily. Barium swallow 2011 normal. Ongoing. Sister has had stricture dilated with benefit. Agrees to GI referral today. Rare GERD, not on medication for this other than tums.   Significant alcohol use.   Preventative: Flu shot - declines Tdap 10/2017 Pneumovax 06/2017 Seat belt use discussed Sunscreen use discussed. No changing moles on skin. Father with melanoma.  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 Interim medical history since our last visit reviewed. Allergies and medications reviewed and updated. Outpatient Medications Prior to Visit  Medication Sig Dispense Refill  . Aspirin-Salicylamide-Caffeine (ARTHRITIS STRENGTH BC POWDER PO) Take 1 packet by mouth 2 (two) times daily as needed (for pain).    Marland Kitchen EPINEPHrine 0.3 mg/0.3 mL IJ SOAJ injection Inject 0.3 mLs (0.3 mg total) into the muscle daily as needed. 2 Device 0  . meclizine (ANTIVERT) 25 MG tablet Take 1 tablet (25 mg  total) by mouth 2 (two) times daily as needed for dizziness (sedation precautions). 30 tablet 0  . nicotine (NICODERM CQ - DOSED IN MG/24 HOURS) 14 mg/24hr patch Place 1 patch (14 mg total) onto the skin daily. (Patient not taking: Reported on 01/19/2018) 28 patch 1   No facility-administered medications prior to visit.      Per HPI unless specifically indicated in ROS section below Review of Systems  Constitutional: Negative for activity change, appetite change, chills, fatigue, fever and unexpected weight change.  HENT: Negative for hearing loss.   Eyes: Negative for visual disturbance.  Respiratory: Negative for cough, chest tightness, shortness of breath and wheezing.   Cardiovascular: Negative for chest pain, palpitations and leg swelling.  Gastrointestinal: Negative for abdominal distention, abdominal pain, blood in stool, constipation, diarrhea, nausea and vomiting.  Genitourinary: Negative for difficulty urinating and hematuria.  Musculoskeletal: Negative for arthralgias, myalgias and neck pain.  Skin: Negative for rash.  Neurological: Positive for dizziness (vertigo). Negative for seizures, syncope and headaches.  Hematological: Negative for adenopathy. Does not bruise/bleed easily.  Psychiatric/Behavioral: Negative for dysphoric mood. The patient is nervous/anxious.        Objective:    BP 128/76 (BP Location: Left Arm, Patient Position: Sitting, Cuff Size: Normal)   Pulse 75   Temp 98.2 F (36.8 C) (Oral)   Ht 5' 7.25" (1.708 m)   Wt 135 lb (61.2 kg)   SpO2 94%   BMI  20.99 kg/m   Wt Readings from Last 3 Encounters:  03/15/18 135 lb (61.2 kg)  03/11/18 138 lb 4 oz (62.7 kg)  11/15/17 140 lb (63.5 kg)    Physical Exam  Constitutional: He is oriented to person, place, and time. He appears well-developed and well-nourished. No distress.  HENT:  Head: Normocephalic and atraumatic.  Right Ear: Hearing, tympanic membrane, external ear and ear canal normal.  Left Ear:  Hearing, tympanic membrane, external ear and ear canal normal.  Nose: Nose normal.  Mouth/Throat: Uvula is midline, oropharynx is clear and moist and mucous membranes are normal. No oropharyngeal exudate, posterior oropharyngeal edema or posterior oropharyngeal erythema.  Eyes: Pupils are equal, round, and reactive to light. Conjunctivae and EOM are normal. No scleral icterus.  Neck: Normal range of motion. Neck supple. No thyromegaly present.  Cardiovascular: Normal rate, regular rhythm, normal heart sounds and intact distal pulses.  No murmur heard. Pulses:      Radial pulses are 2+ on the right side, and 2+ on the left side.  Pulmonary/Chest: Effort normal and breath sounds normal. No respiratory distress. He has no wheezes. He has no rales.  Abdominal: Soft. Bowel sounds are normal. He exhibits no distension and no mass. There is no tenderness. There is no rebound and no guarding.  Musculoskeletal: Normal range of motion. He exhibits no edema.  Marked tenderness to palpation 3rd MCP joint without obvious nodules appreciated He does have some callus formation on R palmar hand  Lymphadenopathy:    He has no cervical adenopathy.  Neurological: He is alert and oriented to person, place, and time.  CN grossly intact, station and gait intact  Skin: Skin is warm and dry. No rash noted.  Psychiatric: He has a normal mood and affect. His behavior is normal. Judgment and thought content normal.  Nursing note and vitals reviewed.     Assessment & Plan:   Problem List Items Addressed This Visit    Smoker    Encouraged cessation. Precontemplative.       Right hand pain    Longstanding, worsening. No triggering. Unclear cause ?arthritis although xrays without significant degeneration of joint. labwork for inflammatory arthritis previously unrevealing. Check anti CCP today, will refer to hand surgery for further evaluation per pt request.       Relevant Orders   Ambulatory referral to Hand  Surgery   Cyclic citrul peptide antibody, IgG   History of essential hypertension    Stable period off medication.       Relevant Orders   Lipid panel   Comprehensive metabolic panel   Health maintenance examination - Primary    Preventative protocols reviewed and updated unless pt declined. Discussed healthy diet and lifestyle.       Habitual alcohol use    Continue to encourage decreased alcohol use. Reviewed risk of longterm alcohol use.       Relevant Orders   Ambulatory referral to Gastroenterology   Comprehensive metabolic panel   Dysphagia    Longstanding, worsening. Anticipate stricture related - will refer to GI for further evaluation.      Relevant Orders   Ambulatory referral to Gastroenterology       No orders of the defined types were placed in this encounter.  Orders Placed This Encounter  Procedures  . Lipid panel  . Comprehensive metabolic panel  . Cyclic citrul peptide antibody, IgG  . Ambulatory referral to Gastroenterology    Referral Priority:   Routine    Referral  Type:   Consultation    Referral Reason:   Specialty Services Required    Number of Visits Requested:   1  . Ambulatory referral to Hand Surgery    Referral Priority:   Routine    Referral Type:   Surgical    Referral Reason:   Specialty Services Required    Requested Specialty:   Hand Surgery    Number of Visits Requested:   1    Follow up plan: Return in about 1 year (around 03/16/2019) for annual exam, prior fasting for blood work.  Ria Bush, MD

## 2018-03-15 NOTE — Assessment & Plan Note (Signed)
Longstanding, worsening. Anticipate stricture related - will refer to GI for further evaluation.

## 2018-03-15 NOTE — Assessment & Plan Note (Signed)
Longstanding, worsening. No triggering. Unclear cause ?arthritis although xrays without significant degeneration of joint. labwork for inflammatory arthritis previously unrevealing. Check anti CCP today, will refer to hand surgery for further evaluation per pt request.

## 2018-03-15 NOTE — Patient Instructions (Addendum)
Labs today We will refer you to GI for trouble swallowing We will refer you to hand surgeon for evaluation of chronic 3rd knuckle pain on right.  I recommend decreased alcohol and limiting goody powders.   Health Maintenance, Male A healthy lifestyle and preventive care is important for your health and wellness. Ask your health care provider about what schedule of regular examinations is right for you. What should I know about weight and diet? Eat a Healthy Diet  Eat plenty of vegetables, fruits, whole grains, low-fat dairy products, and lean protein.  Do not eat a lot of foods high in solid fats, added sugars, or salt.  Maintain a Healthy Weight Regular exercise can help you achieve or maintain a healthy weight. You should:  Do at least 150 minutes of exercise each week. The exercise should increase your heart rate and make you sweat (moderate-intensity exercise).  Do strength-training exercises at least twice a week.  Watch Your Levels of Cholesterol and Blood Lipids  Have your blood tested for lipids and cholesterol every 5 years starting at 48 years of age. If you are at high risk for heart disease, you should start having your blood tested when you are 48 years old. You may need to have your cholesterol levels checked more often if: ? Your lipid or cholesterol levels are high. ? You are older than 48 years of age. ? You are at high risk for heart disease.  What should I know about cancer screening? Many types of cancers can be detected early and may often be prevented. Lung Cancer  You should be screened every year for lung cancer if: ? You are a current smoker who has smoked for at least 30 years. ? You are a former smoker who has quit within the past 15 years.  Talk to your health care provider about your screening options, when you should start screening, and how often you should be screened.  Colorectal Cancer  Routine colorectal cancer screening usually begins at 49  years of age and should be repeated every 5-10 years until you are 48 years old. You may need to be screened more often if early forms of precancerous polyps or small growths are found. Your health care provider may recommend screening at an earlier age if you have risk factors for colon cancer.  Your health care provider may recommend using home test kits to check for hidden blood in the stool.  A small camera at the end of a tube can be used to examine your colon (sigmoidoscopy or colonoscopy). This checks for the earliest forms of colorectal cancer.  Prostate and Testicular Cancer  Depending on your age and overall health, your health care provider may do certain tests to screen for prostate and testicular cancer.  Talk to your health care provider about any symptoms or concerns you have about testicular or prostate cancer.  Skin Cancer  Check your skin from head to toe regularly.  Tell your health care provider about any new moles or changes in moles, especially if: ? There is a change in a mole's size, shape, or color. ? You have a mole that is larger than a pencil eraser.  Always use sunscreen. Apply sunscreen liberally and repeat throughout the day.  Protect yourself by wearing long sleeves, pants, a wide-brimmed hat, and sunglasses when outside.  What should I know about heart disease, diabetes, and high blood pressure?  If you are 44-4 years of age, have your blood pressure checked  every 3-5 years. If you are 52 years of age or older, have your blood pressure checked every year. You should have your blood pressure measured twice-once when you are at a hospital or clinic, and once when you are not at a hospital or clinic. Record the average of the two measurements. To check your blood pressure when you are not at a hospital or clinic, you can use: ? An automated blood pressure machine at a pharmacy. ? A home blood pressure monitor.  Talk to your health care provider about your  target blood pressure.  If you are between 60-39 years old, ask your health care provider if you should take aspirin to prevent heart disease.  Have regular diabetes screenings by checking your fasting blood sugar level. ? If you are at a normal weight and have a low risk for diabetes, have this test once every three years after the age of 2. ? If you are overweight and have a high risk for diabetes, consider being tested at a younger age or more often.  A one-time screening for abdominal aortic aneurysm (AAA) by ultrasound is recommended for men aged 86-75 years who are current or former smokers. What should I know about preventing infection? Hepatitis B If you have a higher risk for hepatitis B, you should be screened for this virus. Talk with your health care provider to find out if you are at risk for hepatitis B infection. Hepatitis C Blood testing is recommended for:  Everyone born from [birth year] through 1966.  Anyone with known risk factors for hepatitis C.  Sexually Transmitted Diseases (STDs)  You should be screened each year for STDs including gonorrhea and chlamydia if: ? You are sexually active and are younger than 48 years of age. ? You are older than 47 years of age and your health care provider tells you that you are at risk for this type of infection. ? Your sexual activity has changed since you were last screened and you are at an increased risk for chlamydia or gonorrhea. Ask your health care provider if you are at risk.  Talk with your health care provider about whether you are at high risk of being infected with HIV. Your health care provider may recommend a prescription medicine to help prevent HIV infection.  What else can I do?  Schedule regular health, dental, and eye exams.  Stay current with your vaccines (immunizations).  Do not use any tobacco products, such as cigarettes, chewing tobacco, and e-cigarettes. If you need help quitting, ask your health care  provider.  Limit alcohol intake to no more than 2 drinks per day. One drink equals 12 ounces of beer, 5 ounces of wine, or 1 ounces of hard liquor.  Do not use street drugs.  Do not share needles.  Ask your health care provider for help if you need support or information about quitting drugs.  Tell your health care provider if you often feel depressed.  Tell your health care provider if you have ever been abused or do not feel safe at home. This information is not intended to replace advice given to you by your health care provider. Make sure you discuss any questions you have with your health care provider. Document Released: 10/07/2007 Document Revised: 12/08/2015 Document Reviewed: 01/12/2015 Elsevier Interactive Patient Education  [employer].

## 2018-03-15 NOTE — Assessment & Plan Note (Signed)
Stable period off medication.  

## 2018-03-18 LAB — CYCLIC CITRUL PEPTIDE ANTIBODY, IGG: Cyclic Citrullin Peptide Ab: <16 UNITS

## 2018-03-20 ENCOUNTER — Encounter: Payer: Self-pay | Admitting: Family Medicine

## 2018-03-20 DIAGNOSIS — E785 Hyperlipidemia, unspecified: Secondary | ICD-10-CM | POA: Insufficient documentation

## 2018-04-10 ENCOUNTER — Ambulatory Visit: Payer: 59 | Admitting: Gastroenterology

## 2018-07-22 ENCOUNTER — Emergency Department (HOSPITAL_COMMUNITY): Payer: BLUE CROSS/BLUE SHIELD

## 2018-07-22 ENCOUNTER — Encounter (HOSPITAL_COMMUNITY): Payer: Self-pay | Admitting: Emergency Medicine

## 2018-07-22 ENCOUNTER — Emergency Department (HOSPITAL_COMMUNITY)
Admission: EM | Admit: 2018-07-22 | Discharge: 2018-07-22 | Disposition: A | Discharge status: Home / Self Care | Payer: BLUE CROSS/BLUE SHIELD | Attending: Emergency Medicine | Admitting: Emergency Medicine

## 2018-07-22 ENCOUNTER — Other Ambulatory Visit: Payer: Self-pay

## 2018-07-22 DIAGNOSIS — I10 Essential (primary) hypertension: Secondary | ICD-10-CM | POA: Diagnosis not present

## 2018-07-22 DIAGNOSIS — F17210 Nicotine dependence, cigarettes, uncomplicated: Secondary | ICD-10-CM | POA: Diagnosis not present

## 2018-07-22 DIAGNOSIS — E785 Hyperlipidemia, unspecified: Secondary | ICD-10-CM | POA: Diagnosis not present

## 2018-07-22 DIAGNOSIS — I252 Old myocardial infarction: Secondary | ICD-10-CM | POA: Insufficient documentation

## 2018-07-22 DIAGNOSIS — R002 Palpitations: Secondary | ICD-10-CM

## 2018-07-22 DIAGNOSIS — J449 Chronic obstructive pulmonary disease, unspecified: Secondary | ICD-10-CM | POA: Diagnosis not present

## 2018-07-22 DIAGNOSIS — I491 Atrial premature depolarization: Secondary | ICD-10-CM | POA: Diagnosis not present

## 2018-07-22 DIAGNOSIS — R079 Chest pain, unspecified: Secondary | ICD-10-CM | POA: Diagnosis not present

## 2018-07-22 LAB — BASIC METABOLIC PANEL
Anion gap: 15 (ref 5–15)
BUN: 15 mg/dL (ref 6–20)
CHLORIDE: 101 mmol/L (ref 98–111)
CO2: 22 mmol/L (ref 22–32)
CREATININE: 0.78 mg/dL (ref 0.61–1.24)
Calcium: 9.1 mg/dL (ref 8.9–10.3)
GFR calc Af Amer: >60 mL/min (ref >60)
GFR calc non Af Amer: >60 mL/min (ref >60)
Glucose, Bld: 153 mg/dL — ABNORMAL HIGH (ref 70–99)
POTASSIUM: 3.8 mmol/L (ref 3.5–5.1)
Sodium: 138 mmol/L (ref 135–145)

## 2018-07-22 LAB — CBC
HEMATOCRIT: 45.6 % (ref 39.0–52.0)
HEMOGLOBIN: 15.3 g/dL (ref 13.0–17.0)
MCH: 32.1 pg (ref 26.0–34.0)
MCHC: 33.6 g/dL (ref 30.0–36.0)
MCV: 95.8 fL (ref 80.0–100.0)
Platelets: 303 10*3/uL (ref 150–400)
RBC: 4.76 MIL/uL (ref 4.22–5.81)
RDW: 13.6 % (ref 11.5–15.5)
WBC: 10.1 10*3/uL (ref 4.0–10.5)
nRBC: 0 % (ref 0.0–0.2)

## 2018-07-22 LAB — TROPONIN I

## 2018-07-22 MED ORDER — VERAPAMIL HCL ER 120 MG PO TBCR
120.0000 mg | EXTENDED_RELEASE_TABLET | Freq: Once | ORAL | Status: AC
  Administered 2018-07-22: 120 mg via ORAL
  Filled 2018-07-22: qty 1

## 2018-07-22 MED ORDER — VERAPAMIL HCL ER 120 MG PO TBCR: 120.0000 mg | EXTENDED_RELEASE_TABLET | Freq: Every day | ORAL | 0 refills | Status: DC

## 2018-07-22 NOTE — Discharge Instructions (Addendum)
You were seen in the emergency department today for palpitations otherwise known as abnormal sensation of your heartbeat.  Your cardiac monitoring showed that you are having premature atrial complexes.  Please see the attached handout.   We are starting you on verapamil, this is a medicine to help monitor your heart rate. Please take this as prescribed starting tomorrow as we have given you a dose in the ER today.  We have prescribed you new medication(s) today. Discuss the medications prescribed today with your pharmacist as they can have adverse effects and interactions with your other medicines including over the counter and prescribed medications. Seek medical evaluation if you start to experience new or abnormal symptoms after taking one of these medicines, seek care immediately if you start to experience difficulty breathing, feeling of your throat closing, facial swelling, or rash as these could be indications of a more serious allergic reaction  Follow up with primary care or with cardiology within 1 week for re-evaluation. Return to the ER for new or worsening symptoms including but not limited to worsening palpitations, chest pain, shortness of breath, passing out, or any other concerns.

## 2018-07-22 NOTE — ED Triage Notes (Signed)
Pt presents with c/o racing heart, started 0500. Pt says its racing like its "outside of his body." w/ anxiety, no SOB. Has dizziness, tightness. Hx of angina w/ stress. Hx, smoking cigarettes, drinking alcohol.

## 2018-07-22 NOTE — ED Notes (Signed)
Patient verbalizes understanding of discharge instructions. Opportunity for questioning and answers were provided. Armband removed by staff, pt discharged from ED.  
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
## 2018-07-24 ENCOUNTER — Telehealth: Payer: Self-pay

## 2018-07-24 NOTE — Telephone Encounter (Signed)
Patient was at the ER on 07/22/2018 and has follow up appointment with cardiologist on 08/05/2018 but D/C notes said to follow up with PCP also. Does this visit need to be virtual or in office visit? I have not called patient yet.

## 2018-07-24 NOTE — Telephone Encounter (Signed)
Left message for patient to call back. Routine to Dr. Synthia Innocent MA box

## 2018-07-24 NOTE — Telephone Encounter (Signed)
Would offer webex appt, would ideally like to see after he sees cards, would still call to f/u on how he's doing since starting verapamil. Thanks.

## 2018-07-25 NOTE — Telephone Encounter (Signed)
Spoke with pt relaying Dr. Synthia Innocent message. Pt verbalizes understanding. Scheduled Webex on 08/07/18 at 12:00 for ER f/u.  Pt states he has severe anxiety when faced with making decisions at work. Says he was prescribed anxiety med previously by Dr. Darnell Level that caused ED issues. He wants to discuss starting Xanax. Used in past with no issues.

## 2018-07-26 ENCOUNTER — Telehealth: Payer: Self-pay

## 2018-07-26 DIAGNOSIS — R002 Palpitations: Secondary | ICD-10-CM

## 2018-07-26 NOTE — Telephone Encounter (Signed)
° °  Patient declined to make changes to appointment at this time. Patient states he wants to be seen sooner because "his heart is not right". Advised patient scheduler would need to have reviewed by MD/APP to determine if he needs to be seen sooner.   Placed caller on hold,  Patient discontinued the call

## 2018-07-26 NOTE — Telephone Encounter (Signed)
I left a message for the patient to return my call about appointment with Dr. Johnsie Cancel on 4/13.

## 2018-07-29 ENCOUNTER — Telehealth: Payer: Self-pay

## 2018-07-29 NOTE — Telephone Encounter (Signed)
Follow up  ° ° °Patient is returning call.  °

## 2018-07-29 NOTE — Telephone Encounter (Signed)
Does not need to be seen in person Had normal cath last year Needs 2 week Zio patch for palpitations and can have f/u video visit in next 2 weeks

## 2018-07-29 NOTE — Telephone Encounter (Signed)
Called patient with Dr. Kyla Balzarine recommendations. Patient was agreeable to plan. Will order monitor and have patient schedule for virtual visit on 08/26/18 to make sure monitor results are back before appointment time. Will send message to monitor tech to call patient with instructions for monitor.      Virtual Visit Pre-Appointment Phone Call  Steps For Call:  1. Confirm consent - "In the setting of the current Covid19 crisis, you are scheduled for a (phone or video) visit with your provider on (date) at (time).  Just as we do with many in-office visits, in order for you to participate in this visit, we must obtain consent.  If you'd like, I can send this to your mychart (if signed up) or email for you to review.  Otherwise, I can obtain your verbal consent now.  All virtual visits are billed to your insurance company just like a normal visit would be.  By agreeing to a virtual visit, we'd like you to understand that the technology does not allow for your provider to perform an examination, and thus may limit your provider's ability to fully assess your condition.  Finally, though the technology is pretty good, we cannot assure that it will always work on either your or our end, and in the setting of a video visit, we may have to convert it to a phone-only visit.  In either situation, we cannot ensure that we have a secure connection.  Are you willing to proceed?"  2. Give patient instructions for WebEx download to smartphone as below if video visit  3. Advise patient to be prepared with any vital sign or heart rhythm information, their current medicines, and a piece of paper and pen handy for any instructions they may receive the day of their visit  4. Inform patient they will receive a phone call 15 minutes prior to their appointment time (may be from unknown caller ID) so they should be prepared to answer  5. Confirm that appointment type is correct in Epic appointment notes (video vs telephone)    TELEPHONE CALL NOTE  [name] has been deemed a candidate for a follow-up tele-health visit to limit community exposure during the Covid-19 pandemic. I spoke with the patient via phone to ensure availability of phone/video source, confirm preferred email & phone number, and discuss instructions and expectations.  I reminded [name] to be prepared with any vital sign and/or heart rhythm information that could potentially be obtained via home monitoring, at the time of his visit. I reminded [name] to expect a phone call at the time of his visit if his visit.  Did the patient verbally acknowledge consent to treatment? Yes  Michaelyn Barter, RN 07/29/2018 11:03 AM   DOWNLOADING THE [place], go to [employer] and type in WebEx in the search bar. [place] [employer], the blue/green circle. The app is free but as with any other app downloads, their phone may require them to verify saved payment information or Apple password. The patient does NOT have to create an account.  - If Android, ask patient to go to [employer] and type in WebEx in the search bar. [place] [employer], the blue/green circle. The app is free but as with any other app downloads, their phone may require them to verify saved payment information or Android password. The patient does NOT have to create an account.   CONSENT FOR TELE-HEALTH VISIT -  PLEASE REVIEW  I hereby voluntarily request, consent and authorize [facility] and its employed or contracted physicians, physician assistants, nurse practitioners or other licensed health care professionals (the Practitioner), to provide me with telemedicine health care services (the "Services") as deemed necessary by the treating Practitioner. I acknowledge and consent to receive the Services by the Practitioner via telemedicine. I understand that the telemedicine visit will involve communicating with the  Practitioner through live audiovisual communication technology and the disclosure of certain medical information by electronic transmission. I acknowledge that I have been given the opportunity to request an in-person assessment or other available alternative prior to the telemedicine visit and am voluntarily participating in the telemedicine visit.  I understand that I have the right to withhold or withdraw my consent to the use of telemedicine in the course of my care at any time, without affecting my right to future care or treatment, and that the Practitioner or I may terminate the telemedicine visit at any time. I understand that I have the right to inspect all information obtained and/or recorded in the course of the telemedicine visit and may receive copies of available information for a reasonable fee.  I understand that some of the potential risks of receiving the Services via telemedicine include:  Marland Kitchen Delay or interruption in medical evaluation due to technological equipment failure or disruption; . Information transmitted may not be sufficient (e.g. poor resolution of images) to allow for appropriate medical decision making by the Practitioner; and/or  . In rare instances, security protocols could fail, causing a breach of personal health information.  Furthermore, I acknowledge that it is my responsibility to provide information about my medical history, conditions and care that is complete and accurate to the best of my ability. I acknowledge that Practitioner's advice, recommendations, and/or decision may be based on factors not within their control, such as incomplete or inaccurate data provided by me or distortions of diagnostic images or specimens that may result from electronic transmissions. I understand that the practice of medicine is not an exact science and that Practitioner makes no warranties or guarantees regarding treatment outcomes. I acknowledge that I will receive a copy of this  consent concurrently upon execution via email to the email address I last provided but may also request a printed copy by calling the office of [facility].    I understand that my insurance will be billed for this visit.   I have read or had this consent read to me. . I understand the contents of this consent, which adequately explains the benefits and risks of the Services being provided via telemedicine.  . I have been provided ample opportunity to ask questions regarding this consent and the Services and have had my questions answered to my satisfaction. . I give my informed consent for the services to be provided through the use of telemedicine in my medical care  By participating in this telemedicine visit I agree to the above.

## 2018-07-29 NOTE — Telephone Encounter (Signed)
Left message for patient to call back.  Patient was seen on 3/30 in ED, will have Dr. Johnsie Cancel review his chart to see if patient needs to be seen in person and which DOD day should he come in if he needs to be seen.

## 2018-07-29 NOTE — Telephone Encounter (Signed)
I left a message for the patient to return my call about appointment with Dr. Johnsie Cancel on 4/13.

## 2018-07-29 NOTE — Addendum Note (Signed)
Addended by: Aris Georgia, Aqsa Sensabaugh L on: 07/29/2018 11:04 AM   Modules accepted: Orders

## 2018-07-30 ENCOUNTER — Telehealth: Payer: Self-pay | Admitting: Radiology

## 2018-07-30 NOTE — Telephone Encounter (Signed)
Zio 14 day Long Term Holter was enrolled to be mailed to patient on 4/7. Went over instructions with patient and he is aware monitor should arrive in 3-5 days.

## 2018-08-03 ENCOUNTER — Ambulatory Visit (INDEPENDENT_AMBULATORY_CARE_PROVIDER_SITE_OTHER): Payer: BLUE CROSS/BLUE SHIELD

## 2018-08-03 DIAGNOSIS — R002 Palpitations: Secondary | ICD-10-CM

## 2018-08-05 ENCOUNTER — Ambulatory Visit: Payer: BLUE CROSS/BLUE SHIELD | Admitting: Cardiovascular Disease

## 2018-08-07 ENCOUNTER — Ambulatory Visit (INDEPENDENT_AMBULATORY_CARE_PROVIDER_SITE_OTHER): Payer: BLUE CROSS/BLUE SHIELD | Admitting: Family Medicine

## 2018-08-07 ENCOUNTER — Encounter: Payer: Self-pay | Admitting: Family Medicine

## 2018-08-07 VITALS — Temp 98.2°F | Ht 67.25 in | Wt 138.0 lb

## 2018-08-07 DIAGNOSIS — R002 Palpitations: Secondary | ICD-10-CM | POA: Diagnosis not present

## 2018-08-07 DIAGNOSIS — F411 Generalized anxiety disorder: Secondary | ICD-10-CM | POA: Diagnosis not present

## 2018-08-07 MED ORDER — DESVENLAFAXINE SUCCINATE ER 25 MG PO TB24: 1.0000 | ORAL_TABLET | Freq: Every day | ORAL | 3 refills | Status: DC

## 2018-08-07 MED ORDER — VERAPAMIL HCL ER 120 MG PO TBCR: 120.0000 mg | EXTENDED_RELEASE_TABLET | Freq: Every day | ORAL | 3 refills | Status: DC

## 2018-08-07 NOTE — Progress Notes (Signed)
Virtual visit completed through [employer]. Due to national recommendations of social distancing due to [place] 19, a virtual visit is felt to be most appropriate for this patient at this time.   Patient location: home Provider location: [place] [facility], office If any vitals were documented, they were collected by patient at home unless specified below.    Temp 98.2 F (36.8 C) (Oral)   Ht 5' 7.25" (1.708 m)   Wt 138 lb (62.6 kg)   BMI 21.45 kg/m    CC: ER f/u visit. Subjective:    Patient ID: [name], male    DOB: [birth year], 48 y.o.   MRN: [id number]  HPI: [name] is a 48 y.o. male presenting on 08/07/2018 for Hospitalization Follow-up (Seen at [facility] ED 07/22/18. )   Recent ER visit 07/22/2018 note reviewed - seen for palpitations with anxiety, dizziness, chest pressure. EKG showed PACs vs ectopy. Labs were reassuring. He was referred to cardiology for outpatient Zio monitor and has planned cards f/u next month. ER started verapamil CR 120mg  daily with benefit in palpitations.   He had hospitalization 05/2017 for elevated troponin with reassuring left heart catheterization (no significant CAD).   Has previously been prescribed celexa for GAD but caused ED issues and does not want to take again.  Notes ongoing palpitations that have lead to increased anxiety. Also increased work stress causes palpitations. He took off 1 wk from work.  Notes increased palpitations with sex.  This past weekend he had no trouble cutting down tree with chainsaw (high exertion).   Caffeine - changing to decaffeinated coffee.  Significant alcohol use - 3 beers with dinner, more on weekends.  Smoking - 1ppd.   Sleeps well, only sleeps 6 hours at a time. Appetite ok. Energy levels ok.   Asks about xanax use.      Relevant past medical, surgical, family and social history reviewed and updated as indicated. Interim medical history since our last visit reviewed. Allergies and medications  reviewed and updated. Outpatient Medications Prior to Visit  Medication Sig Dispense Refill  . Aspirin-Salicylamide-Caffeine (ARTHRITIS STRENGTH BC POWDER PO) Take 1 packet by mouth 2 (two) times daily as needed (for pain).    Marland Kitchen EPINEPHrine 0.3 mg/0.3 mL IJ SOAJ injection Inject 0.3 mLs (0.3 mg total) into the muscle daily as needed. 2 Device 0  . verapamil (CALAN-SR) 120 MG CR tablet Take 1 tablet (120 mg total) by mouth at bedtime. 14 tablet 0   No facility-administered medications prior to visit.      Per HPI unless specifically indicated in ROS section below Review of Systems Objective:    Temp 98.2 F (36.8 C) (Oral)   Ht 5' 7.25" (1.708 m)   Wt 138 lb (62.6 kg)   BMI 21.45 kg/m   Wt Readings from Last 3 Encounters:  08/07/18 138 lb (62.6 kg)  07/22/18 135 lb (61.2 kg)  03/15/18 135 lb (61.2 kg)     Physical exam: Gen: alert, NAD, not ill appearing Chest: zio patch in place Pulm: speaks in complete sentences without increased work of breathing Psych: normal mood, normal thought content      Results for orders placed or performed during the hospital encounter of 07/22/18  CBC  Result Value Ref Range   WBC 10.1 4.0 - 10.5 K/uL   RBC 4.76 4.22 - 5.81 MIL/uL   Hemoglobin 15.3 13.0 - 17.0 g/dL   HCT 45.6 39.0 - 52.0 %   MCV 95.8 80.0 -  100.0 fL   MCH 32.1 26.0 - 34.0 pg   MCHC 33.6 30.0 - 36.0 g/dL   RDW 13.6 11.5 - 15.5 %   Platelets 303 150 - 400 K/uL   nRBC 0.0 0.0 - 0.2 %  Basic metabolic panel  Result Value Ref Range   Sodium 138 135 - 145 mmol/L   Potassium 3.8 3.5 - 5.1 mmol/L   Chloride 101 98 - 111 mmol/L   CO2 22 22 - 32 mmol/L   Glucose, Bld 153 (H) 70 - 99 mg/dL   BUN 15 6 - 20 mg/dL   Creatinine, Ser 0.78 0.61 - 1.24 mg/dL   Calcium 9.1 8.9 - 10.3 mg/dL   GFR calc non Af Amer >60 >60 mL/min   GFR calc Af Amer >60 >60 mL/min   Anion gap 15 5 - 15  Troponin I - ONCE - STAT  Result Value Ref Range   Troponin I <0.03 <0.03 ng/mL   Depression  screen [facility] 2/9 08/07/2018 03/15/2018 03/15/2018 06/28/2017  Decreased Interest 0 0 0 0  Down, Depressed, Hopeless 0 1 1 0  PHQ - 2 Score 0 1 1 0  Altered sleeping 3 - - -  Tired, decreased energy 0 - - -  Change in appetite 1 - - -  Feeling bad or failure about yourself  0 - - -  Trouble concentrating 0 - - -  Moving slowly or fidgety/restless 0 - - -  Suicidal thoughts 0 - - -  PHQ-9 Score 4 - - -    GAD 7 : Generalized Anxiety Score 08/07/2018 06/28/2017  Nervous, Anxious, on Edge 0 1  Control/stop worrying 0 1  Worry too much - different things 0 2  Trouble relaxing 0 2  Restless 1 0  Easily annoyed or irritable 1 0  Afraid - awful might happen 0 2  Total GAD 7 Score 2 8   Assessment & Plan:   Problem List Items Addressed This Visit    Palpitations    Appreciate cards assistance. Pending zio patch, upcoming cards appt next month. Advised continue verapamil for now - he seems to be tolerating well, feels it has helped decrease frequency and severity of palpitation episodes.       GAD (generalized anxiety disorder) - Primary    Ongoing trouble. Previously celexa caused sexual dysfunction. Recommended against benzodiazepine in alcohol use, discussed risks of dependence/tolerance and habit forming nature of these medications. Will trial SNRI (less sexual side effects than SSRIs). Pt will price out pristiq. Denies SI/HI. Advised to stop and let us know immediately if increased suicidality noted once SNRI started.       Relevant Medications   Desvenlafaxine Succinate ER (PRISTIQ) 25 MG TB24       Meds ordered this encounter  Medications  . verapamil (CALAN-SR) 120 MG CR tablet    Sig: Take 1 tablet (120 mg total) by mouth at bedtime.    Dispense:  30 tablet    Refill:  3  . Desvenlafaxine Succinate ER (PRISTIQ) 25 MG TB24    Sig: Take 25 mg by mouth daily.    Dispense:  30 tablet    Refill:  3   No orders of the defined types were placed in this encounter.   Follow up  plan: No follow-ups on file.  Ria Bush, MD

## 2018-08-07 NOTE — Assessment & Plan Note (Signed)
Appreciate cards assistance. Pending zio patch, upcoming cards appt next month. Advised continue verapamil for now - he seems to be tolerating well, feels it has helped decrease frequency and severity of palpitation episodes.

## 2018-08-07 NOTE — Assessment & Plan Note (Signed)
Ongoing trouble. Previously celexa caused sexual dysfunction. Recommended against benzodiazepine in alcohol use, discussed risks of dependence/tolerance and habit forming nature of these medications. Will trial SNRI (less sexual side effects than SSRIs). Pt will price out pristiq. Denies SI/HI. Advised to stop and let us know immediately if increased suicidality noted once SNRI started.

## 2018-08-09 ENCOUNTER — Telehealth: Payer: Self-pay | Admitting: *Deleted

## 2018-08-09 MED ORDER — BUSPIRONE HCL 5 MG PO TABS: 5.0000 mg | ORAL_TABLET | Freq: Every day | ORAL | 1 refills | Status: DC

## 2018-08-09 NOTE — Telephone Encounter (Signed)
Ok - stop pristiq (desvenlafaxine). Trial buspar 5mg  nightly - this is daily anxiety medicine which may help. We will start at very low dose and increase slowly as tolerated.

## 2018-08-09 NOTE — Telephone Encounter (Signed)
Would have him try taking it at night time to see if less daytime lightheadedness.

## 2018-08-09 NOTE — Telephone Encounter (Signed)
Left message on vm per dpr relaying Dr. G's message.  

## 2018-08-09 NOTE — Telephone Encounter (Signed)
Copied from [place] [id number]. Topic: General - Other >> Aug 09, 2018  8:03 AM Richardo Priest, NT wrote: Reason for CRM: Patient called in stating he does not to take his Desvenlafaxine Succinate ER (PRISTIQ) 25 MG Tb24 due to it causing him to be lightheaded at work. Please advise and call back number is [phone].

## 2018-08-09 NOTE — Telephone Encounter (Signed)
Left message on voicemail for patient to call back. 

## 2018-08-09 NOTE — Addendum Note (Signed)
Addended by: Ria Bush on: 08/09/2018 04:13 PM   Modules accepted: Orders

## 2018-08-09 NOTE — Telephone Encounter (Signed)
Patient called back and was informed as instructed. Patient stated the reason he is not taking it is because it caused the same problem that Celexa did.. Patient stated that he is having side effect from a sexual standpoint and can not have an organism. Patient stated that it is causing erectile dysfunctioning.[name]

## 2018-08-22 ENCOUNTER — Other Ambulatory Visit: Payer: Self-pay

## 2018-08-22 DIAGNOSIS — R002 Palpitations: Secondary | ICD-10-CM | POA: Diagnosis not present

## 2018-08-23 ENCOUNTER — Telehealth: Payer: Self-pay | Admitting: Cardiovascular Disease

## 2018-08-23 NOTE — Telephone Encounter (Signed)
Follow Up:     Pt is returning a call from today. Pt does not know who called.

## 2018-08-23 NOTE — Telephone Encounter (Signed)
Called patient with monitor results
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
## 2018-08-26 ENCOUNTER — Ambulatory Visit (INDEPENDENT_AMBULATORY_CARE_PROVIDER_SITE_OTHER): Payer: BLUE CROSS/BLUE SHIELD | Admitting: Cardiovascular Disease

## 2018-08-26 ENCOUNTER — Encounter: Payer: Self-pay | Admitting: Cardiovascular Disease

## 2018-08-26 ENCOUNTER — Other Ambulatory Visit: Payer: Self-pay

## 2018-08-26 VITALS — BP 122/72 | HR 81 | Ht 67.0 in | Wt 145.0 lb

## 2018-08-26 DIAGNOSIS — R002 Palpitations: Secondary | ICD-10-CM | POA: Diagnosis not present

## 2018-08-26 MED ORDER — VERAPAMIL HCL ER 240 MG PO TBCR: 240.0000 mg | EXTENDED_RELEASE_TABLET | Freq: Every day | ORAL | 3 refills | Status: DC

## 2018-08-26 NOTE — Patient Instructions (Signed)
Medication Instructions:  Your physician has recommended you make the following change in your medication: 1-INCREASE Verapamil 240 mg by mouth daily.  If you need a refill on your cardiac medications before your next appointment, please call your pharmacy.   Lab work:  If you have labs (blood work) drawn today and your tests are completely normal, you will receive your results only by: Marland Kitchen MyChart Message (if you have MyChart) OR . A paper copy in the mail If you have any lab test that is abnormal or we need to change your treatment, we will call you to review the results.  Testing/Procedures: Your physician has requested that you have an echocardiogram in 4 weeks. Echocardiography is a painless test that uses sound waves to create images of your heart. It provides your doctor with information about the size and shape of your heart and how well your heart's chambers and valves are working. This procedure takes approximately one hour. There are no restrictions for this procedure.  Follow-Up: At [facility], you and your health needs are our priority.  As part of our continuing mission to provide you with exceptional heart care, we have created designated Provider Care Teams.  These Care Teams include your primary Cardiologist (physician) and Advanced Practice Providers (APPs -  Physician Assistants and Nurse Practitioners) who all work together to provide you with the care you need, when you need it. You will need a follow up appointment in 6 months.  Please call our office 2 months in advance to schedule this appointment.  You may see Jenkins Rouge, MD or one of the following Advanced Practice Providers on your designated Care Team:   Truitt Merle, NP Cecilie Kicks, NP . Kathyrn Drown, NP

## 2018-09-18 ENCOUNTER — Telehealth (HOSPITAL_COMMUNITY): Payer: Self-pay | Admitting: Cardiology

## 2018-09-18 NOTE — Telephone Encounter (Signed)
Called to give echo appointment instructions. 

## 2018-09-18 NOTE — Telephone Encounter (Signed)

## 2018-09-18 NOTE — Telephone Encounter (Signed)
OK. Thank you

## 2018-09-18 NOTE — Telephone Encounter (Signed)
Follow up ° ° °Patient is returning call. Please call. °

## 2018-09-19 ENCOUNTER — Other Ambulatory Visit: Payer: Self-pay

## 2018-09-19 ENCOUNTER — Other Ambulatory Visit (HOSPITAL_COMMUNITY): Payer: BLUE CROSS/BLUE SHIELD

## 2018-09-20 ENCOUNTER — Telehealth: Payer: Self-pay | Admitting: Family Medicine

## 2018-09-20 NOTE — Telephone Encounter (Signed)
Pt went to have echocardiogram done yesterday and they would not let him in the building. They took his temp and said it was 100.7. He states he does not feel sick and when he got home, he took his temp and it was 98.4. They told him he needed to call and let PCP know. They said he can try again in 2 weeks. He is requesting a call from assistant to discuss.

## 2018-09-20 NOTE — Telephone Encounter (Signed)
Ensure no cough, dyspnea, Covid exposure or other covid symptoms. Have him monitor Temp at home, let us know if runs >101.  Otherwise will monitor for now.  Will have to wait 2 wks for [place] as that is their policy.

## 2018-09-20 NOTE — Telephone Encounter (Addendum)
Spoke with patient.  He is asymptomatic and states that everywhere else that he has had his temperature checked has been completely normal for the past 2 days  He continues to have his temperature checked at work and it is normal and at home.   He thinks something was wrong with their device and he is not quarantining.  His work is aware of his situation and he is continuing to work at this time.  I explained to him the recommendations and that people can be asymptomatic and still have the virus but he does not feel this applies to him.  He is dealing with a "dislocated rib" that is painful and took only one of his friends oxycodone to help with this.  He does not need anything for pain and understands that we have to abide by cardiology's office protocol regarding postponing of his test.   He does not need anything at this point and knows to keep a check on his temp and symptoms and if any concerns develop to quarantine and to please follow up with [place] via phone as soon as possible.   Patient verbalizes understanding.

## 2018-09-20 NOTE — Telephone Encounter (Signed)
Does patient need a visit for this?

## 2018-11-22 ENCOUNTER — Ambulatory Visit (HOSPITAL_COMMUNITY): Payer: BC Managed Care – PPO | Attending: Internal Medicine

## 2018-11-22 ENCOUNTER — Other Ambulatory Visit: Payer: Self-pay

## 2018-11-22 DIAGNOSIS — R002 Palpitations: Secondary | ICD-10-CM | POA: Diagnosis not present

## 2019-03-10 ENCOUNTER — Telehealth: Payer: Self-pay | Admitting: Family Medicine

## 2019-03-10 DIAGNOSIS — M79641 Pain in right hand: Secondary | ICD-10-CM

## 2019-03-10 NOTE — Addendum Note (Signed)
Addended by: Ria Bush on: 03/10/2019 01:54 PM   Modules accepted: Orders

## 2019-03-10 NOTE — Telephone Encounter (Signed)
Patient called back about referral He stated that he did want [place] and needed Thursday or Friday anytime,.

## 2019-03-10 NOTE — Telephone Encounter (Signed)
Appt set up with Dr Caralyn Guile on 03/13/19 at 12;00pm patient notified.

## 2019-03-10 NOTE — Telephone Encounter (Signed)
New referral placed.

## 2019-03-10 NOTE — Telephone Encounter (Signed)
Patient was referred to a specialist for his hand last year.  Patient owed the specialist money from his carpal tunnel surgery and they wouldn't make the appointment until he paid it off.  Patient said the his knuckles are much worse.  He has numbness and there's a crunchy feeling when he uses his hand. Patient said his pain level is 100. Patient would like a new referral.  Patient prefers [facility] and he can go anytime.

## 2019-03-13 DIAGNOSIS — M25522 Pain in left elbow: Secondary | ICD-10-CM | POA: Diagnosis not present

## 2019-03-13 DIAGNOSIS — M79641 Pain in right hand: Secondary | ICD-10-CM | POA: Diagnosis not present

## 2019-03-13 DIAGNOSIS — M19049 Primary osteoarthritis, unspecified hand: Secondary | ICD-10-CM | POA: Insufficient documentation

## 2019-03-13 DIAGNOSIS — M7702 Medial epicondylitis, left elbow: Secondary | ICD-10-CM | POA: Insufficient documentation

## 2019-03-13 DIAGNOSIS — M13841 Other specified arthritis, right hand: Secondary | ICD-10-CM | POA: Diagnosis not present

## 2019-03-13 DIAGNOSIS — M13849 Other specified arthritis, unspecified hand: Secondary | ICD-10-CM | POA: Diagnosis not present

## 2019-04-10 DIAGNOSIS — M13841 Other specified arthritis, right hand: Secondary | ICD-10-CM | POA: Diagnosis not present

## 2019-05-22 DIAGNOSIS — M13849 Other specified arthritis, unspecified hand: Secondary | ICD-10-CM | POA: Diagnosis not present

## 2019-05-22 DIAGNOSIS — Z01818 Encounter for other preprocedural examination: Secondary | ICD-10-CM | POA: Diagnosis not present

## 2019-05-22 DIAGNOSIS — M79641 Pain in right hand: Secondary | ICD-10-CM | POA: Diagnosis not present

## 2019-05-30 DIAGNOSIS — M79641 Pain in right hand: Secondary | ICD-10-CM | POA: Diagnosis not present

## 2019-06-20 DIAGNOSIS — M13841 Other specified arthritis, right hand: Secondary | ICD-10-CM | POA: Diagnosis not present

## 2019-06-20 DIAGNOSIS — R52 Pain, unspecified: Secondary | ICD-10-CM | POA: Diagnosis not present

## 2019-07-18 DIAGNOSIS — R52 Pain, unspecified: Secondary | ICD-10-CM | POA: Diagnosis not present

## 2019-07-18 DIAGNOSIS — M13841 Other specified arthritis, right hand: Secondary | ICD-10-CM | POA: Diagnosis not present

## 2019-08-15 ENCOUNTER — Ambulatory Visit: Payer: BC Managed Care – PPO | Admitting: Cardiovascular Disease

## 2019-10-06 ENCOUNTER — Ambulatory Visit (INDEPENDENT_AMBULATORY_CARE_PROVIDER_SITE_OTHER): Payer: BC Managed Care – PPO | Admitting: Family Medicine

## 2019-10-06 ENCOUNTER — Ambulatory Visit (INDEPENDENT_AMBULATORY_CARE_PROVIDER_SITE_OTHER)
Admission: RE | Admit: 2019-10-06 | Discharge: 2019-10-06 | Disposition: A | Discharge status: Home / Self Care | Payer: BC Managed Care – PPO | Source: Ambulatory Visit | Attending: Family Medicine | Admitting: Family Medicine

## 2019-10-06 ENCOUNTER — Other Ambulatory Visit: Payer: Self-pay

## 2019-10-06 ENCOUNTER — Encounter: Payer: Self-pay | Admitting: Family Medicine

## 2019-10-06 VITALS — BP 118/80 | HR 100 | Temp 99.0°F | Ht 67.5 in | Wt 140.8 lb

## 2019-10-06 DIAGNOSIS — S3992XA Unspecified injury of lower back, initial encounter: Secondary | ICD-10-CM | POA: Diagnosis not present

## 2019-10-06 DIAGNOSIS — M533 Sacrococcygeal disorders, not elsewhere classified: Secondary | ICD-10-CM | POA: Diagnosis not present
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
## 2019-10-07 ENCOUNTER — Telehealth: Payer: Self-pay | Admitting: Family Medicine

## 2019-10-07 NOTE — Telephone Encounter (Signed)
Pt was seen yesterday and was written out of work x 2 weeks.  Pt states that he is not even allowed on the work job site with the note as stated.  Petro Talent was seen in my clinic on 10/06/2019. He needs to remain out of work for 2 weeks until cleared by physician.   Patient is requesting the the note be changed to "remain out of work x 1 week and may return to work 'light duty' after one week."  Please advise, thanks.

## 2019-10-08 NOTE — Telephone Encounter (Signed)
Left message for [name] that I have written him an updated work note and sent it to his MyChart.  I ask that if he needs [place] to print out the letter for him to come by the office to pick up, to just let us know.

## 2019-10-08 NOTE — Telephone Encounter (Signed)
"  remain out of work x 1 week and may return to work 'light duty' after one week."  I think this is fine  Can you help?

## 2019-10-20 ENCOUNTER — Ambulatory Visit (INDEPENDENT_AMBULATORY_CARE_PROVIDER_SITE_OTHER): Payer: BC Managed Care – PPO | Admitting: Family Medicine

## 2019-10-20 ENCOUNTER — Other Ambulatory Visit: Payer: Self-pay

## 2019-10-20 ENCOUNTER — Encounter: Payer: Self-pay | Admitting: Family Medicine

## 2019-10-20 VITALS — BP 118/70 | HR 60 | Temp 98.3°F | Ht 67.5 in | Wt 146.8 lb

## 2019-10-20 DIAGNOSIS — M5441 Lumbago with sciatica, right side: Secondary | ICD-10-CM

## 2019-10-20 DIAGNOSIS — M533 Sacrococcygeal disorders, not elsewhere classified: Secondary | ICD-10-CM | POA: Diagnosis not present

## 2019-10-20 MED ORDER — CYCLOBENZAPRINE HCL 10 MG PO TABS: 10.0000 mg | ORAL_TABLET | Freq: Every evening | ORAL | 1 refills | Status: DC | PRN

## 2019-10-20 MED ORDER — PREDNISONE 20 MG PO TABS: ORAL_TABLET | ORAL | 0 refills | Status: DC

## 2019-10-20 NOTE — Progress Notes (Signed)
Terry Dismore T. Natalea Sutliff, MD, [place]  [facility] and [place] at [facility] [place], [address]  Phone: [phone]   FAX: [phone]  [name] - 49 y.o. male   MRN [id number]   Date of Birth: 01-Dec-1970  Date: 10/20/2019   PCP: Ria Bush, MD   Referral: Ria Bush, MD  Chief Complaint  Patient presents with   Back Pain    leg pain, started several months ago    This visit occurred during the SARS-CoV-2 public health emergency.  Safety protocols were in place, including screening questions prior to the visit, additional usage of staff PPE, and extensive cleaning of exam room while observing appropriate contact time as indicated for disinfecting solutions.   Subjective:   [name] is a 49 y.o. very pleasant male patient with Body mass index is 22.65 kg/m. who presents with the following:  F/u sacral pain, 10/06/2019, trauma from landing in his pool.    Went to work today.  Has been lying around for the last 2 weeks and sleeping a lot.  Shooting pain down the back of his leg and has been getting in the posterior of the buttocks and some rad down to his knee.    Prior back pain.  Before was having pain in his back. Worsened now.  One time picked up a pool table - that was 12-15 years ago.   Now the pain in his sacrum has improved but he is having quite a bit of muscle tightness in the erector spinae complex from approximately L2-S1 bilaterally.  This is worse on the right he also has some pain in the posterior buttocks region he also has some radicular pain that goes down his leg to approximately at the region of his knee.  He is not have any numbness or weakness.  He does have some tightness in the posterior pelvis as well.  Review of Systems is noted in the HPI, as appropriate   Objective:   BP 118/70    Pulse 60    Temp 98.3 F (36.8 C) (Oral)    Ht 5' 7.5" (1.715 m)    Wt 146 lb 12  oz (66.6 kg)    SpO2 93%    BMI 22.65 kg/m    GEN: No acute distress; alert,appropriate. PULM: Breathing comfortably in no respiratory distress PSYCH: Normally interactive.   Range of motion at  the waist: Flexion: normal Extension: normal Lateral bending: normal Rotation: all normal  No echymosis or edema Rises to examination table with no difficulty Gait: non antalgic  Inspection/Deformity: N Paraspinus Tenderness: L2-S1 bilaterally  B Ankle Dorsiflexion (L5,4): 5/5 B Great Toe Dorsiflexion (L5,4): 5/5 Heel Walk (L5): WNL Toe Walk (S1): WNL Rise/Squat (L4): WNL  SENSORY B Medial Foot (L4): WNL B Dorsum (L5): WNL B Lateral (S1): WNL Light Touch: WNL Pinprick: WNL  REFLEXES Knee (L4): 2+ Ankle (S1): 2+  B SLR, seated: neg B SLR, supine: neg B Log Roll: neg B Sciatic Notch: NT   HIP EXAM: SIDE: Right ROM: Abduction, Flexion, Internal and External range of motion: Full range of motion Pain with terminal IROM and EROM: No GTB: NT SLR: NEG Knees: No effusion FABER: NT REVERSE FABER: This causes some symptomatic relief, and the patient feels like this feels good in relation to his pain in the buttocks  piriformis: TTP at direct palpation Str: flexion: 5/5 abduction: 5/5 adduction: 5/5 Strength testing non-tender  Radiology: DG Sacrum/Coccyx  Result Date: 10/06/2019 CLINICAL DATA:  Sacral pain after cannon balling into a pool and hitting the bottom. EXAM: SACRUM AND COCCYX - 2+ VIEW COMPARISON:  CT chest, abdomen, and pelvis dated March 03, 2008. FINDINGS: There is no evidence of fracture or other focal bone lesions. The sacral struts are intact. The pubic symphysis and sacroiliac joints are unremarkable. IMPRESSION: Negative. Electronically Signed   By: Titus Dubin M.D.   On: 10/06/2019 15:25    Assessment and Plan:     ICD-10-CM   1. Acute back pain with sciatica, right  M54.41   2. Sacral pain  M53.3    Sacral pain improving status post  trauma.  Pool floor trauma.  He also is having some adjacent piriformis syndrome and attachment of the piriformis at the sacrum.  This is quite tight to palpation.  He is also having some very tight lower back muscular tightness.  I reviewed a comprehensive piriformis and low back program with the patient.  Pulse with steroids, Flexeril at night  Follow-up: Return in about 1 month (around 11/19/2019).  Meds ordered this encounter  Medications   predniSONE (DELTASONE) 20 MG tablet    Sig: 2 tabs po daily for 5 days, then 1 tab po daily for 5 days    Dispense:  15 tablet    Refill:  0   cyclobenzaprine (FLEXERIL) 10 MG tablet    Sig: Take 1 tablet (10 mg total) by mouth at bedtime as needed for muscle spasms.    Dispense:  30 tablet    Refill:  1   There are no discontinued medications. No orders of the defined types were placed in this encounter.   Signed,  Maud Deed. Laberta Wilbon, MD   Outpatient Encounter Medications as of 10/20/2019  Medication Sig   Aspirin-Salicylamide-Caffeine (ARTHRITIS STRENGTH BC POWDER PO) Take 1 packet by mouth 2 (two) times daily as needed (for pain).   EPINEPHrine 0.3 mg/0.3 mL IJ SOAJ injection Inject 0.3 mLs (0.3 mg total) into the muscle daily as needed.   cyclobenzaprine (FLEXERIL) 10 MG tablet Take 1 tablet (10 mg total) by mouth at bedtime as needed for muscle spasms.   predniSONE (DELTASONE) 20 MG tablet 2 tabs po daily for 5 days, then 1 tab po daily for 5 days   No facility-administered encounter medications on file as of 10/20/2019.

## 2019-11-26 ENCOUNTER — Ambulatory Visit (INDEPENDENT_AMBULATORY_CARE_PROVIDER_SITE_OTHER): Payer: BC Managed Care – PPO | Admitting: Family Medicine

## 2019-11-26 ENCOUNTER — Encounter: Payer: Self-pay | Admitting: Family Medicine

## 2019-11-26 ENCOUNTER — Other Ambulatory Visit: Payer: Self-pay

## 2019-11-26 VITALS — BP 120/70 | HR 83 | Temp 98.4°F | Ht 67.5 in | Wt 143.5 lb

## 2019-11-26 DIAGNOSIS — M25532 Pain in left wrist: Secondary | ICD-10-CM

## 2019-11-26 DIAGNOSIS — G5701 Lesion of sciatic nerve, right lower limb: Secondary | ICD-10-CM | POA: Diagnosis not present

## 2019-11-26 DIAGNOSIS — M5431 Sciatica, right side: Secondary | ICD-10-CM

## 2019-11-26 MED ORDER — DICLOFENAC SODIUM 75 MG PO TBEC: 75.0000 mg | DELAYED_RELEASE_TABLET | Freq: Two times a day (BID) | ORAL | 3 refills | Status: DC

## 2019-11-26 NOTE — Progress Notes (Signed)
Terry Curenton T. Silas Muff, MD, [place]  Primary Care and [place] at [facility] [place], [address]  Phone: [phone]  FAX: [phone]  [name] - 49 y.o. male  MRN [id number]  Date of Birth: 08-Jul-1970  Date: 11/26/2019  PCP: Ria Bush, MD  Referral: Ria Bush, MD  Chief Complaint  Patient presents with  . Follow-up    Back Pain  . Wrist Pain    Left    This visit occurred during the SARS-CoV-2 public health emergency.  Safety protocols were in place, including screening questions prior to the visit, additional usage of staff PPE, and extensive cleaning of exam room while observing appropriate contact time as indicated for disinfecting solutions.   Subjective:   [name] is a 49 y.o. very pleasant male patient with Body mass index is 22.14 kg/m. who presents with the following:  I saw the patient on October 20, 2019 with some radicular back pain.  At that point I did put him on some steroids, and previous this he primarily was having pain in his sacrum after jumping into his pool and landing on the concrete.  His initial sacral pain is predominantly improved.  His back pain is predominantly improved, but he does continue to have some radicular pain down the posterior aspect of his right lower extremity.  He notices it when this is worse when he is lying down and also when he is seated for a long period of time, and he does have to do this in his occupation.  Predominant pain at this point is his left-sided wrist pain this is predominantly at the ulnar aspect of his wrist and he has significant pain with forced extension as well as forced ulnar deviation.  In the hand and wrist there is no numbness or tingling.  He does think that his grip is somewhat impaired, and he does work as an Clinical biochemist with relatively heavy labor.  L wrist pain.  About 1 month, ulnar aspect of his L wrist.  TFCC  injury 10/10 pain  Wrist splint  Review of Systems is noted in the HPI, as appropriate   Objective:   BP 120/70   Pulse 83   Temp 98.4 F (36.9 C) (Temporal)   Ht 5' 7.5" (1.715 m)   Wt 143 lb 8 oz (65.1 kg)   SpO2 98%   BMI 22.14 kg/m    GEN: WDWN, NAD, Non-toxic, Alert & Oriented x 3 HEENT: Atraumatic, Normocephalic.  Ears and Nose: No external deformity. EXTR: No clubbing/cyanosis/edema NEURO: Normal gait.  PSYCH: Normally interactive. Conversant. Not depressed or anxious appearing.  Calm demeanor.   Hand: L Ecchymosis or edema: neg ROM wrist/hand/digits/elbow: full  Carpals, MCP's, digits: Ulnar pain Distal Ulna and Radius: Ulnar pain Supination lift test: neg Ecchymosis or edema: neg Cysts/nodules: neg Finkelstein's test: neg Snuffbox tenderness: neg Scaphoid tubercle: NT Hook of Hamate: NT Resisted supination: Some pain Full composite fist Grip, all digits: 4+/5 on the left, and 5/5 on the right no tenosynovitis  Axial load test: Significant pain Phalen's: neg Tinel's: neg Atrophy: neg  Hand sensation: intact   Range of motion at  the waist: Flexion, extension, lateral bending and rotation: Full  No echymosis or edema Rises to examination table with mild difficulty Gait: minimally antalgic  Inspection/Deformity: N Paraspinus Tenderness: Minimal, but the patient does have tenderness directly in the posterior gluteal region on the right side  Strength testing  in its entirety of the lower extremity is intact  SENSORY B Medial Foot (L4): WNL B Dorsum (L5): WNL B Lateral (S1): WNL Light Touch: WNL Pinprick: WNL  REFLEXES Knee (L4): 2+ Ankle (S1): 2+  B SLR, seated: neg B SLR, supine: neg B FABER: neg B Reverse FABER: Tenderness on the right and additional tenderness with direct palpation of the piriformis  B Greater Troch: NT B Log Roll: neg B Stork: NT B Sciatic Notch: NT   Radiology: No results found.  Assessment and Plan:      ICD-10-CM   1. Acute wrist pain, left  M25.532   2. Sciatica, right side  M54.31   3. Piriformis syndrome, right  G57.01    Acute wrist pain with uncertain diagnosis.  By history and exam more likely TFCC injury.  If symptoms do not improve with immobilization, then additional therapy may include a injection with corticosteroid.  If symptoms ultimately do not improve then additional plans or intervention may include MR arthrogram, and ultimately the patient could require operative intervention if symptoms persist.  Intermittent sciatica, decompensated.  The patient does have clinically piriformis syndrome now.  I went over a comprehensive rehabilitation for this with him.  Follow-up: Return in about 1 month (around 12/27/2019).  Meds ordered this encounter  Medications  . diclofenac (VOLTAREN) 75 MG EC tablet    Sig: Take 1 tablet (75 mg total) by mouth 2 (two) times daily.    Dispense:  60 tablet    Refill:  3   Medications Discontinued During This Encounter  Medication Reason  . predniSONE (DELTASONE) 20 MG tablet Completed Course   No orders of the defined types were placed in this encounter.   Signed,  Maud Deed. Khylei Wilms, MD   Outpatient Encounter Medications as of 11/26/2019  Medication Sig  . Aspirin-Salicylamide-Caffeine (ARTHRITIS STRENGTH BC POWDER PO) Take 1 packet by mouth 2 (two) times daily as needed (for pain).  . cyclobenzaprine (FLEXERIL) 10 MG tablet Take 1 tablet (10 mg total) by mouth at bedtime as needed for muscle spasms.  Marland Kitchen EPINEPHrine 0.3 mg/0.3 mL IJ SOAJ injection Inject 0.3 mLs (0.3 mg total) into the muscle daily as needed.  . diclofenac (VOLTAREN) 75 MG EC tablet Take 1 tablet (75 mg total) by mouth 2 (two) times daily.  . [DISCONTINUED] predniSONE (DELTASONE) 20 MG tablet 2 tabs po daily for 5 days, then 1 tab po daily for 5 days   No facility-administered encounter medications on file as of 11/26/2019.

## 2019-11-27 ENCOUNTER — Encounter: Payer: Self-pay | Admitting: Family Medicine

## 2019-12-30 ENCOUNTER — Ambulatory Visit: Payer: BC Managed Care – PPO | Admitting: Family Medicine

## 2020-01-05 ENCOUNTER — Encounter: Payer: Self-pay | Admitting: Family Medicine

## 2020-01-05 ENCOUNTER — Ambulatory Visit (INDEPENDENT_AMBULATORY_CARE_PROVIDER_SITE_OTHER): Payer: BC Managed Care – PPO | Admitting: Family Medicine

## 2020-01-05 ENCOUNTER — Other Ambulatory Visit: Payer: Self-pay

## 2020-01-05 VITALS — BP 124/84 | HR 103 | Temp 98.2°F | Ht 67.5 in | Wt 142.2 lb

## 2020-01-05 DIAGNOSIS — M25532 Pain in left wrist: Secondary | ICD-10-CM | POA: Diagnosis not present

## 2020-01-05 DIAGNOSIS — Z1211 Encounter for screening for malignant neoplasm of colon: Secondary | ICD-10-CM

## 2020-01-05 NOTE — Patient Instructions (Signed)
We will refer you for colonoscopy Continue brace and diclofenac twice daily as needed for the next 2-4 weeks. If ongoing left wrist pain, return to see Dr Lorelei Pont.  Keep working on fully quitting smoking. Good to see you today

## 2020-01-05 NOTE — Assessment & Plan Note (Signed)
Largely improved compared to last month ?TFCC injury. Discussed continued wrist immobilizer use as well as diclofenac use with plan to slowly taper off both over the next 2-4 wks. RTC with sports med if returning pain. Otherwise f/u PRN.

## 2020-01-05 NOTE — Progress Notes (Signed)
This visit was conducted in person.  BP 124/84 (BP Location: Right Arm, Patient Position: Sitting, Cuff Size: Normal)   Pulse (!) 103   Temp 98.2 F (36.8 C) (Temporal)   Ht 5' 7.5" (1.715 m)   Wt 142 lb 3 oz (64.5 kg)   SpO2 96%   BMI 21.94 kg/m    CC: 1 mo f/u visit  Subjective:    Patient ID: [name], male    DOB: 01-Apr-1971, 49 y.o.   MRN: [id number]  HPI: [name] is a 49 y.o. male presenting on 01/05/2020 for Wrist Pain (Here for 1 mo f/u for left wrist pain.  Sees Dr. Lorelei Pont. )   See prior note for details. Saw sports med rec 1 mo f/u with Dr Lorelei Pont - somehow was scheduled with me for sports med f/u.   Recently seen in the last few months by Dr Lorelei Pont first with back pain that did improve, then with L wrist pain - note reviewed - unclear diagnosis thought TFCC treated with immobilization and diclofenac 75mg  bid course with RF3 - consider MR arthrogram vs steroid injection. Denies inciting trauma/injury to left wrist. H/o gout to foot - but this feels different.   Diclofenac has significantly helped - taking BID scheduled. Initially caused watery diarrhea with has since improved. He is also using wrist immobilizer at night with benefit.   Has been having R sciatica - worse when in bed.  Interested in colonoscopy - will refer.     Relevant past medical, surgical, family and social history reviewed and updated as indicated. Interim medical history since our last visit reviewed. Allergies and medications reviewed and updated. Outpatient Medications Prior to Visit  Medication Sig Dispense Refill  . Aspirin-Salicylamide-Caffeine (ARTHRITIS STRENGTH BC POWDER PO) Take 1 packet by mouth 2 (two) times daily as needed (for pain).    . cyclobenzaprine (FLEXERIL) 10 MG tablet Take 1 tablet (10 mg total) by mouth at bedtime as needed for muscle spasms. 30 tablet 1  . diclofenac (VOLTAREN) 75 MG EC tablet Take 1 tablet (75 mg total) by mouth 2 (two) times daily. 60 tablet 3    . EPINEPHrine 0.3 mg/0.3 mL IJ SOAJ injection Inject 0.3 mLs (0.3 mg total) into the muscle daily as needed. 2 Device 0   No facility-administered medications prior to visit.     Per HPI unless specifically indicated in ROS section below Review of Systems Objective:  BP 124/84 (BP Location: Right Arm, Patient Position: Sitting, Cuff Size: Normal)   Pulse (!) 103   Temp 98.2 F (36.8 C) (Temporal)   Ht 5' 7.5" (1.715 m)   Wt 142 lb 3 oz (64.5 kg)   SpO2 96%   BMI 21.94 kg/m   Wt Readings from Last 3 Encounters:  01/05/20 142 lb 3 oz (64.5 kg)  11/26/19 143 lb 8 oz (65.1 kg)  10/20/19 146 lb 12 oz (66.6 kg)      Physical Exam Vitals and nursing note reviewed.  Constitutional:      Appearance: Normal appearance. He is not ill-appearing.  Musculoskeletal:        General: Tenderness (mild discomfort to palpation at L TFCC ligament) present. No swelling. Normal range of motion.     Right lower leg: No edema.     Left lower leg: No edema.     Comments: No significant reproducible tenderness (only mildly at L TFCC). No pain with testing wrist flexion/extension, latearl or medial flexion against resistance. Discomfort with  forced supination and pronation against resistance.   Skin:    General: Skin is warm and dry.     Findings: No erythema or rash.  Neurological:     Mental Status: He is alert.  Psychiatric:        Mood and Affect: Mood normal.        Behavior: Behavior normal.       Results for orders placed or performed during the hospital encounter of 07/22/18  CBC  Result Value Ref Range   WBC 10.1 4.0 - 10.5 K/uL   RBC 4.76 4.22 - 5.81 MIL/uL   Hemoglobin 15.3 13.0 - 17.0 g/dL   HCT 45.6 39 - 52 %   MCV 95.8 80.0 - 100.0 fL   MCH 32.1 26.0 - 34.0 pg   MCHC 33.6 30.0 - 36.0 g/dL   RDW 13.6 11.5 - 15.5 %   Platelets 303 150 - 400 K/uL   nRBC 0.0 0.0 - 0.2 %  Basic metabolic panel  Result Value Ref Range   Sodium 138 135 - 145 mmol/L   Potassium 3.8 3.5 - 5.1  mmol/L   Chloride 101 98 - 111 mmol/L   CO2 22 22 - 32 mmol/L   Glucose, Bld 153 (H) 70 - 99 mg/dL   BUN 15 6 - 20 mg/dL   Creatinine, Ser 0.78 0.61 - 1.24 mg/dL   Calcium 9.1 8.9 - 10.3 mg/dL   GFR calc non Af Amer >60 >60 mL/min   GFR calc Af Amer >60 >60 mL/min   Anion gap 15 5 - 15  Troponin I - ONCE - STAT  Result Value Ref Range   Troponin I <0.03 <0.03 ng/mL   Assessment & Plan:  This visit occurred during the SARS-CoV-2 public health emergency.  Safety protocols were in place, including screening questions prior to the visit, additional usage of staff PPE, and extensive cleaning of exam room while observing appropriate contact time as indicated for disinfecting solutions.  Advised to return for CPE at his convenience Problem List Items Addressed This Visit    Left wrist pain - Primary    Largely improved compared to last month ?TFCC injury. Discussed continued wrist immobilizer use as well as diclofenac use with plan to slowly taper off both over the next 2-4 wks. RTC with sports med if returning pain. Otherwise f/u PRN.        Other Visit Diagnoses    Special screening for malignant neoplasms, colon       Relevant Orders   Ambulatory referral to Gastroenterology       No orders of the defined types were placed in this encounter.  Orders Placed This Encounter  Procedures  . Ambulatory referral to Gastroenterology    Referral Priority:   Routine    Referral Type:   Consultation    Referral Reason:   Specialty Services Required    Number of Visits Requested:   1   Patient Instructions  We will refer you for colonoscopy Continue brace and diclofenac twice daily as needed for the next 2-4 weeks. If ongoing left wrist pain, return to see Dr Lorelei Pont.  Keep working on fully quitting smoking. Good to see you today   Follow up plan: Return if symptoms worsen or fail to improve.  Ria Bush, MD

## 2020-03-24 ENCOUNTER — Other Ambulatory Visit: Payer: Self-pay | Admitting: Family Medicine

## 2020-03-24 NOTE — Telephone Encounter (Signed)
Last office visit 01/05/2020 for left wrist pain.  Last refilled 11/26/2019 for #60 with 3 refills by Dr. Lorelei Pont.  No future appointments.

## 2020-07-19 ENCOUNTER — Encounter (HOSPITAL_COMMUNITY): Payer: Self-pay | Admitting: Emergency Medicine

## 2020-07-19 ENCOUNTER — Other Ambulatory Visit: Payer: Self-pay

## 2020-07-19 ENCOUNTER — Ambulatory Visit (HOSPITAL_COMMUNITY)
Admission: EM | Admit: 2020-07-19 | Discharge: 2020-07-19 | Disposition: A | Discharge status: Home / Self Care | Payer: BC Managed Care – PPO | Attending: Family Medicine | Admitting: Family Medicine

## 2020-07-19 DIAGNOSIS — M62830 Muscle spasm of back: Secondary | ICD-10-CM

## 2020-07-19 DIAGNOSIS — M5441 Lumbago with sciatica, right side: Secondary | ICD-10-CM

## 2020-07-19 MED ORDER — DEXAMETHASONE SODIUM PHOSPHATE 10 MG/ML IJ SOLN
10.0000 mg | Freq: Once | INTRAMUSCULAR | Status: AC
  Administered 2020-07-19: 10 mg via INTRAMUSCULAR

## 2020-07-19 MED ORDER — CYCLOBENZAPRINE HCL 10 MG PO TABS: ORAL_TABLET | ORAL | 0 refills | Status: DC

## 2020-07-19 MED ORDER — DEXAMETHASONE SODIUM PHOSPHATE 10 MG/ML IJ SOLN
INTRAMUSCULAR | Status: AC
  Filled 2020-07-19: qty 1
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
## 2020-07-19 NOTE — ED Triage Notes (Signed)
Pt presents with lower back pain xs 3 days. States has been using heating pad with some relief.

## 2020-07-19 NOTE — Discharge Instructions (Addendum)

## 2020-07-28 ENCOUNTER — Ambulatory Visit (INDEPENDENT_AMBULATORY_CARE_PROVIDER_SITE_OTHER): Payer: BC Managed Care – PPO | Admitting: Family Medicine

## 2020-07-28 ENCOUNTER — Encounter: Payer: Self-pay | Admitting: Family Medicine

## 2020-07-28 ENCOUNTER — Other Ambulatory Visit: Payer: Self-pay

## 2020-07-28 DIAGNOSIS — M5441 Lumbago with sciatica, right side: Secondary | ICD-10-CM

## 2020-07-28 MED ORDER — DICLOFENAC SODIUM 75 MG PO TBEC: 75.0000 mg | DELAYED_RELEASE_TABLET | Freq: Two times a day (BID) | ORAL | 0 refills | Status: DC | PRN

## 2020-07-28 NOTE — Assessment & Plan Note (Addendum)
Anticipate lumbar sprain with R sciatica.  Supportive care reviewed. rec heating pad, rest, light duty at work for 1 wk, diclofenac anti inflammatory.  No red flags.  Update if not improving with treatment, or if ongoing symptoms past 4-6 wks. Consider imaging at that time.

## 2020-07-28 NOTE — Progress Notes (Signed)
Patient ID: [name], male    DOB: 01/18/1971, 50 y.o.   MRN: [id number]  This visit was conducted in person.  BP 130/90   Pulse 92   Temp 98.3 F (36.8 C) (Temporal)   Ht 5' 7.5" (1.715 m)   Wt 143 lb 4 oz (65 kg)   SpO2 96%   BMI 22.10 kg/m    CC: UCC f/u visit  Subjective:   HPI: [name] is a 50 y.o. male presenting on 07/28/2020 for Hospitalization Follow-up (Seen on 07/19/20 at [facility], dx acute right-sided low back pain with right-sided sciatica. )   Pain started 07/16/2020.  Seen at [facility] last week, note reviewed.  Presented with 3 days of persistent R lower back pain without inciting trauma/injury. This started after going from floor on his knees to standing. Treated with dexamethasone 10mg  injection and flexeril muscle relaxant. Not sure how beneficial this was.   Notes more trouble with prolonged sitting, prolonged walking, standing fully upright, and transitions ie squatting, does better with standing. Describes mid lower back pain below belt line. Known chronic R sided sciatica.   No fevers/chills, numbness, paresthesias or weakness of leg, no saddle anesthesia.  Chronic lower back muscle tightness  Remembers 25 yrs ago while lifting pool table felt pop and sudden pain when he tried to lift with his back.   Takes BC powders for headache.      Relevant past medical, surgical, family and social history reviewed and updated as indicated. Interim medical history since our last visit reviewed. Allergies and medications reviewed and updated. Outpatient Medications Prior to Visit  Medication Sig Dispense Refill  . Aspirin-Salicylamide-Caffeine (ARTHRITIS STRENGTH BC POWDER PO) Take 1 packet by mouth 2 (two) times daily as needed (for pain).    Marland Kitchen EPINEPHrine 0.3 mg/0.3 mL IJ SOAJ injection Inject 0.3 mLs (0.3 mg total) into the muscle daily as needed. 2 Device 0  . diclofenac (VOLTAREN) 75 MG EC tablet Take 75 mg by mouth 2 (two) times daily.    . cyclobenzaprine  (FLEXERIL) 10 MG tablet Take 1 tablet by mouth before bed as needed for muscle spasm. Warning: May cause drowsiness. 10 tablet 0   No facility-administered medications prior to visit.     Per HPI unless specifically indicated in ROS section below Review of Systems Objective:  BP 130/90   Pulse 92   Temp 98.3 F (36.8 C) (Temporal)   Ht 5' 7.5" (1.715 m)   Wt 143 lb 4 oz (65 kg)   SpO2 96%   BMI 22.10 kg/m   Wt Readings from Last 3 Encounters:  07/28/20 143 lb 4 oz (65 kg)  01/05/20 142 lb 3 oz (64.5 kg)  11/26/19 143 lb 8 oz (65.1 kg)      Physical Exam Vitals and nursing note reviewed.  Constitutional:      Appearance: Normal appearance. He is not ill-appearing.  Musculoskeletal:        General: Tenderness present.     Comments:  No pain midline spine No paraspinous mm tenderness Neg SLR bilaterally. No pain with int/ext rotation at hip. Neg FABER. No pain at SIJ, GTB or sciatic notch bilaterally.  Discomfort to palpation R lower sacrum  Skin:    General: Skin is warm and dry.     Findings: No rash.  Neurological:     General: No focal deficit present.     Mental Status: He is alert.     Sensory: Sensation  is intact.     Motor: Motor function is intact.     Coordination: Coordination is intact.     Gait: Gait is intact.     Deep Tendon Reflexes:     Reflex Scores:      Patellar reflexes are 2+ on the right side and 2+ on the left side.    Comments:  5/5 BLE strength  Psychiatric:        Mood and Affect: Mood normal.        Behavior: Behavior normal.       Assessment & Plan:  This visit occurred during the SARS-CoV-2 public health emergency.  Safety protocols were in place, including screening questions prior to the visit, additional usage of staff PPE, and extensive cleaning of exam room while observing appropriate contact time as indicated for disinfecting solutions.   Problem List Items Addressed This Visit    Low back pain    Anticipate lumbar sprain  with R sciatica.  Supportive care reviewed. rec heating pad, rest, light duty at work for 1 wk, diclofenac anti inflammatory.  No red flags.  Update if not improving with treatment, or if ongoing symptoms past 4-6 wks. Consider imaging at that time.       Relevant Medications   diclofenac (VOLTAREN) 75 MG EC tablet       Meds ordered this encounter  Medications  . diclofenac (VOLTAREN) 75 MG EC tablet    Sig: Take 1 tablet (75 mg total) by mouth 2 (two) times daily as needed for moderate pain. Take with food    Dispense:  30 tablet    Refill:  0   No orders of the defined types were placed in this encounter.   Patient Instructions  I think you do have lower back sprain - do exercises provided today, heating pad to back, recommend light duty at work for the next 1.5 wks (letter provided today).  May use diclofenac 75mg  twice daily as needed for discomfort - take with food.  If ongoing pain past 4-6 wks, let me know for xray of lower back.  If new weakness of leg or worsening pain, let us know right away.   Follow up plan: No follow-ups on file.  Ria Bush, MD

## 2020-07-28 NOTE — Patient Instructions (Signed)
I think you do have lower back sprain - do exercises provided today, heating pad to back, recommend light duty at work for the next 1.5 wks (letter provided today).  May use diclofenac 75mg  twice daily as needed for discomfort - take with food.  If ongoing pain past 4-6 wks, let me know for xray of lower back.  If new weakness of leg or worsening pain, let us know right away.

## 2020-08-10 ENCOUNTER — Other Ambulatory Visit: Payer: Self-pay | Admitting: Family Medicine

## 2020-08-10 NOTE — Telephone Encounter (Signed)
Refill request Voltaren Last refill 07/28/20 #30 Last office visit 07/28/20

## 2020-12-08 ENCOUNTER — Ambulatory Visit (INDEPENDENT_AMBULATORY_CARE_PROVIDER_SITE_OTHER): Payer: BC Managed Care – PPO | Admitting: Family Medicine

## 2020-12-08 ENCOUNTER — Encounter: Payer: Self-pay | Admitting: Family Medicine

## 2020-12-08 ENCOUNTER — Other Ambulatory Visit: Payer: Self-pay

## 2020-12-08 VITALS — BP 136/68 | HR 84 | Temp 99.2°F | Ht 67.5 in | Wt 147.0 lb

## 2020-12-08 DIAGNOSIS — R21 Rash and other nonspecific skin eruption: Secondary | ICD-10-CM | POA: Insufficient documentation

## 2020-12-08 DIAGNOSIS — R7309 Other abnormal glucose: Secondary | ICD-10-CM | POA: Diagnosis not present

## 2020-12-08 DIAGNOSIS — F1090 Alcohol use, unspecified, uncomplicated: Secondary | ICD-10-CM

## 2020-12-08 DIAGNOSIS — R140 Abdominal distension (gaseous): Secondary | ICD-10-CM | POA: Diagnosis not present

## 2020-12-08 DIAGNOSIS — M2540 Effusion, unspecified joint: Secondary | ICD-10-CM | POA: Diagnosis not present

## 2020-12-08 DIAGNOSIS — Z7289 Other problems related to lifestyle: Secondary | ICD-10-CM

## 2020-12-08 MED ORDER — DICLOFENAC SODIUM 75 MG PO TBEC: DELAYED_RELEASE_TABLET | ORAL | 0 refills | Status: DC

## 2020-12-08 MED ORDER — TRIAMCINOLONE ACETONIDE 0.5 % EX OINT: 1.0000 "application " | TOPICAL_OINTMENT | Freq: Two times a day (BID) | CUTANEOUS | 0 refills | Status: DC

## 2020-12-08 NOTE — Assessment & Plan Note (Signed)
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

## 2020-12-08 NOTE — Assessment & Plan Note (Signed)
Per pt 2-3 per day and more on weekends  He is aware this is excessive

## 2020-12-08 NOTE — Assessment & Plan Note (Signed)
Dry/scale and some abraded area, originally itchy and weeping and swollen Also swollen first MTP joint near it  Px triamcinolone 0.5 mg to try bid Labs today for autoimmune joint dz (no prior hx of psoriasis)  Pending those will need f/u with pcp

## 2020-12-08 NOTE — Assessment & Plan Note (Signed)
Pt has worries about DM  Random glucose in labs from 2020 was in 150s A1c today  No polydipsia or polyuria or weight loss

## 2020-12-08 NOTE — Patient Instructions (Addendum)
Keep the rash clean with soap and water  Try the triamcinolone twice daily for rash and itching   If rash or joint swelling get worse please let us know   I ordered some labs for joint swelling with rash  Also a1c to check for diabetes  Chemistry profile   Stop at check out to schedule your annual exam with Dr Darnell Level.

## 2020-12-08 NOTE — Assessment & Plan Note (Signed)
Pt c/o protuberant abdomen Reassuring exam /no tenderness  No bowel changes Does overuse alcohol  cmet and cbc ordered today
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
## 2020-12-09 LAB — COMPREHENSIVE METABOLIC PANEL WITH GFR
ALT: 17 U/L (ref 0–53)
AST: 19 U/L (ref 0–37)
Albumin: 4.5 g/dL (ref 3.5–5.2)
Alkaline Phosphatase: 61 U/L (ref 39–117)
BUN: 16 mg/dL (ref 6–23)
CO2: 26 meq/L (ref 19–32)
Calcium: 9.8 mg/dL (ref 8.4–10.5)
Chloride: 103 meq/L (ref 96–112)
Creatinine, Ser: 0.78 mg/dL (ref 0.40–1.50)
GFR: 103.36 mL/min
Glucose, Bld: 73 mg/dL (ref 70–99)
Potassium: 3.9 meq/L (ref 3.5–5.1)
Sodium: 139 meq/L (ref 135–145)
Total Bilirubin: 0.5 mg/dL (ref 0.2–1.2)
Total Protein: 7 g/dL (ref 6.0–8.3)

## 2020-12-09 LAB — CBC WITH DIFFERENTIAL/PLATELET
Basophils Absolute: 0.1 K/uL (ref 0.0–0.1)
Basophils Relative: 1.2 % (ref 0.0–3.0)
Eosinophils Absolute: 0.2 K/uL (ref 0.0–0.7)
Eosinophils Relative: 2.3 % (ref 0.0–5.0)
HCT: 43.7 % (ref 39.0–52.0)
Hemoglobin: 15.1 g/dL (ref 13.0–17.0)
Lymphocytes Relative: 23.9 % (ref 12.0–46.0)
Lymphs Abs: 2.1 K/uL (ref 0.7–4.0)
MCHC: 34.5 g/dL (ref 30.0–36.0)
MCV: 97.4 fl (ref 78.0–100.0)
Monocytes Absolute: 0.9 K/uL (ref 0.1–1.0)
Monocytes Relative: 9.8 % (ref 3.0–12.0)
Neutro Abs: 5.5 K/uL (ref 1.4–7.7)
Neutrophils Relative %: 62.8 % (ref 43.0–77.0)
Platelets: 327 K/uL (ref 150.0–400.0)
RBC: 4.49 Mil/uL (ref 4.22–5.81)
RDW: 13.6 % (ref 11.5–15.5)
WBC: 8.7 K/uL (ref 4.0–10.5)

## 2020-12-09 LAB — SEDIMENTATION RATE: Sed Rate: 16 mm/h (ref 0–20)

## 2020-12-09 LAB — HEMOGLOBIN A1C: Hgb A1c MFr Bld: 5.5 % (ref 4.6–6.5)

## 2020-12-13 LAB — RHEUMATOID FACTOR: Rheumatoid fact SerPl-aCnc: <14 IU/mL (ref <14)

## 2020-12-13 LAB — ANTI-NUCLEAR AB-TITER (ANA TITER): ANA Titer 1: 1:40 {titer} — ABNORMAL HIGH

## 2020-12-13 LAB — ANA: Anti Nuclear Antibody (ANA): POSITIVE — AB

## 2020-12-14 ENCOUNTER — Telehealth: Payer: Self-pay | Admitting: *Deleted

## 2020-12-14 NOTE — Telephone Encounter (Signed)
Left VM requesting pt to call the office back 

## 2020-12-14 NOTE — Telephone Encounter (Signed)
-----   Message from Abner Greenspan, MD sent at 12/13/2020  8:55 PM EDT ----- ANA is mildly positive This can happen in some auto immune joint diseases  In light of his joint complaints and rash I would like to refer him to rheumatology  Let me know if agreeable  F/u with pcp also next month as planned  Will cc to pcp

## 2020-12-19 ENCOUNTER — Other Ambulatory Visit: Payer: Self-pay | Admitting: Family Medicine

## 2020-12-19 DIAGNOSIS — E785 Hyperlipidemia, unspecified: Secondary | ICD-10-CM

## 2020-12-19 DIAGNOSIS — Z1159 Encounter for screening for other viral diseases: Secondary | ICD-10-CM

## 2020-12-19 DIAGNOSIS — M2540 Effusion, unspecified joint: Secondary | ICD-10-CM

## 2020-12-24 ENCOUNTER — Other Ambulatory Visit: Payer: Self-pay

## 2020-12-24 ENCOUNTER — Other Ambulatory Visit (INDEPENDENT_AMBULATORY_CARE_PROVIDER_SITE_OTHER): Payer: BC Managed Care – PPO

## 2020-12-24 DIAGNOSIS — M2540 Effusion, unspecified joint: Secondary | ICD-10-CM | POA: Diagnosis not present

## 2020-12-24 DIAGNOSIS — E785 Hyperlipidemia, unspecified: Secondary | ICD-10-CM

## 2020-12-24 DIAGNOSIS — Z1159 Encounter for screening for other viral diseases: Secondary | ICD-10-CM | POA: Diagnosis not present

## 2020-12-27 ENCOUNTER — Telehealth: Payer: Self-pay | Admitting: Family Medicine

## 2020-12-27 DIAGNOSIS — M2540 Effusion, unspecified joint: Secondary | ICD-10-CM

## 2020-12-27 DIAGNOSIS — R768 Other specified abnormal immunological findings in serum: Secondary | ICD-10-CM | POA: Insufficient documentation

## 2020-12-27 DIAGNOSIS — R21 Rash and other nonspecific skin eruption: Secondary | ICD-10-CM

## 2020-12-27 NOTE — Telephone Encounter (Signed)
Referral done  Will route to pcp

## 2020-12-27 NOTE — Telephone Encounter (Signed)
-----   Message from Tammi Sou, [place] sent at 12/24/2020  4:00 PM EDT ----- Pt came in for his labs, I advised him of Dr. Marliss Coots comments and gave him a copy of the lab results. Pt agrees with referral. Would like to see someone in [place] if possible.

## 2020-12-28 LAB — LIPID PANEL
Cholesterol: 208 mg/dL — ABNORMAL HIGH (ref <200)
HDL: 59 mg/dL (ref >=40)
LDL Cholesterol (Calc): 123 mg/dL (calc) — ABNORMAL HIGH
Non-HDL Cholesterol (Calc): 149 mg/dL (calc) — ABNORMAL HIGH (ref <130)
Total CHOL/HDL Ratio: 3.5 (calc) (ref <5.0)
Triglycerides: 145 mg/dL (ref <150)

## 2020-12-28 LAB — HEPATITIS C ANTIBODY
Hepatitis C Ab: NONREACTIVE
SIGNAL TO CUT-OFF: 0 (ref <1.00)

## 2020-12-28 LAB — URIC ACID: Uric Acid, Serum: 5 mg/dL (ref 4.0–8.0)

## 2020-12-31 ENCOUNTER — Encounter: Payer: BC Managed Care – PPO | Admitting: Family Medicine

## 2021-01-01 NOTE — Telephone Encounter (Signed)
Noted. Thank yoyu.

## 2021-01-17 ENCOUNTER — Ambulatory Visit (INDEPENDENT_AMBULATORY_CARE_PROVIDER_SITE_OTHER): Payer: BC Managed Care – PPO | Admitting: Family Medicine

## 2021-01-17 ENCOUNTER — Other Ambulatory Visit: Payer: Self-pay

## 2021-01-17 ENCOUNTER — Encounter: Payer: Self-pay | Admitting: Family Medicine

## 2021-01-17 VITALS — BP 138/82 | HR 83 | Temp 99.8°F | Ht 67.5 in | Wt 145.0 lb

## 2021-01-17 DIAGNOSIS — Z0000 Encounter for general adult medical examination without abnormal findings: Secondary | ICD-10-CM | POA: Diagnosis not present

## 2021-01-17 DIAGNOSIS — Z125 Encounter for screening for malignant neoplasm of prostate: Secondary | ICD-10-CM

## 2021-01-17 DIAGNOSIS — F1090 Alcohol use, unspecified, uncomplicated: Secondary | ICD-10-CM

## 2021-01-17 DIAGNOSIS — F17200 Nicotine dependence, unspecified, uncomplicated: Secondary | ICD-10-CM

## 2021-01-17 DIAGNOSIS — R768 Other specified abnormal immunological findings in serum: Secondary | ICD-10-CM

## 2021-01-17 DIAGNOSIS — E785 Hyperlipidemia, unspecified: Secondary | ICD-10-CM | POA: Diagnosis not present

## 2021-01-17 DIAGNOSIS — Z1211 Encounter for screening for malignant neoplasm of colon: Secondary | ICD-10-CM | POA: Diagnosis not present

## 2021-01-17 DIAGNOSIS — R21 Rash and other nonspecific skin eruption: Secondary | ICD-10-CM

## 2021-01-17 DIAGNOSIS — R1310 Dysphagia, unspecified: Secondary | ICD-10-CM

## 2021-01-17 DIAGNOSIS — Z8679 Personal history of other diseases of the circulatory system: Secondary | ICD-10-CM

## 2021-01-17 DIAGNOSIS — M2540 Effusion, unspecified joint: Secondary | ICD-10-CM

## 2021-01-17 DIAGNOSIS — Z7289 Other problems related to lifestyle: Secondary | ICD-10-CM

## 2021-01-17 LAB — TSH: TSH: 2.2 u[IU]/mL (ref 0.35–5.50)

## 2021-01-17 LAB — PSA: PSA: 1.27 ng/mL (ref 0.10–4.00)

## 2021-01-17 NOTE — Assessment & Plan Note (Signed)
Stable period off medication. The 10-year ASCVD risk score (Arnett DK, et al., 2019) is: 8.3%   Values used to calculate the score:     Age: 50 years     Sex: Male     Is Non-Hispanic African American: No     Diabetic: No     Tobacco smoker: Yes     Systolic Blood Pressure: 828 mmHg     Is BP treated: No     HDL Cholesterol: 59 mg/dL     Total Cholesterol: 208 mg/dL

## 2021-01-17 NOTE — Assessment & Plan Note (Signed)
Recent urate normal

## 2021-01-17 NOTE — Assessment & Plan Note (Signed)
BP stable on exam.

## 2021-01-17 NOTE — Assessment & Plan Note (Signed)
?  eczmea vs tinea given maceration also present - rec treat with lotrimin BID x 3-4 wks, update with effect.

## 2021-01-17 NOTE — Assessment & Plan Note (Signed)
Encouraged ongoing cessation attempts

## 2021-01-17 NOTE — Patient Instructions (Addendum)
PSA blood test today.  We will refer you for colonoscopy and for lung cancer screening program.  Work on quitting smoking and decreased alcohol use.  Schedule dentist and eye exam.  Consider shingrix vaccine.  For foot - start lotrimin (clotrimazole) twice daily for 3-4 weeks. Use between toes as well.  Return as needed or in 1 year for next physical.   Health Maintenance, Male Adopting a healthy lifestyle and getting preventive care are important in promoting health and wellness. Ask your health care provider about: The right schedule for you to have regular tests and exams. Things you can do on your own to prevent diseases and keep yourself healthy. What should I know about diet, weight, and exercise? Eat a healthy diet  Eat a diet that includes plenty of vegetables, fruits, low-fat dairy products, and lean protein. Do not eat a lot of foods that are high in solid fats, added sugars, or sodium. Maintain a healthy weight Body mass index (BMI) is a measurement that can be used to identify possible weight problems. It estimates body fat based on height and weight. Your health care provider can help determine your BMI and help you achieve or maintain a healthy weight. Get regular exercise Get regular exercise. This is one of the most important things you can do for your health. Most adults should: Exercise for at least 150 minutes each week. The exercise should increase your heart rate and make you sweat (moderate-intensity exercise). Do strengthening exercises at least twice a week. This is in addition to the moderate-intensity exercise. Spend less time sitting. Even light physical activity can be beneficial. Watch cholesterol and blood lipids Have your blood tested for lipids and cholesterol at 51 years of age, then have this test every 5 years. You may need to have your cholesterol levels checked more often if: Your lipid or cholesterol levels are high. You are older than 51 years of  age. You are at high risk for heart disease. What should I know about cancer screening? Many types of cancers can be detected early and may often be prevented. Depending on your health history and family history, you may need to have cancer screening at various ages. This may include screening for: Colorectal cancer. Prostate cancer. Skin cancer. Lung cancer. What should I know about heart disease, diabetes, and high blood pressure? Blood pressure and heart disease High blood pressure causes heart disease and increases the risk of stroke. This is more likely to develop in people who have high blood pressure readings, are of African descent, or are overweight. Talk with your health care provider about your target blood pressure readings. Have your blood pressure checked: Every 3-5 years if you are 67-22 years of age. Every year if you are 89 years old or older. If you are between the ages of 59 and 64 and are a current or former smoker, ask your health care provider if you should have a one-time screening for abdominal aortic aneurysm (AAA). Diabetes Have regular diabetes screenings. This checks your fasting blood sugar level. Have the screening done: Once every three years after age 71 if you are at a normal weight and have a low risk for diabetes. More often and at a younger age if you are overweight or have a high risk for diabetes. What should I know about preventing infection? Hepatitis B If you have a higher risk for hepatitis B, you should be screened for this virus. Talk with your health care provider to find  out if you are at risk for hepatitis B infection. Hepatitis C Blood testing is recommended for: Everyone born from [birth year] through 1966. Anyone with known risk factors for hepatitis C. Sexually transmitted infections (STIs) You should be screened each year for STIs, including gonorrhea and chlamydia, if: You are sexually active and are younger than 51 years of age. You are  older than 51 years of age and your health care provider tells you that you are at risk for this type of infection. Your sexual activity has changed since you were last screened, and you are at increased risk for chlamydia or gonorrhea. Ask your health care provider if you are at risk. Ask your health care provider about whether you are at high risk for HIV. Your health care provider may recommend a prescription medicine to help prevent HIV infection. If you choose to take medicine to prevent HIV, you should first get tested for HIV. You should then be tested every 3 months for as long as you are taking the medicine. Follow these instructions at home: Lifestyle Do not use any products that contain nicotine or tobacco, such as cigarettes, e-cigarettes, and chewing tobacco. If you need help quitting, ask your health care provider. Do not use street drugs. Do not share needles. Ask your health care provider for help if you need support or information about quitting drugs. Alcohol use Do not drink alcohol if your health care provider tells you not to drink. If you drink alcohol: Limit how much you have to 0-2 drinks a day. Be aware of how much alcohol is in your drink. In the [place] one drink equals one 12 oz bottle of beer (355 mL), one 5 oz glass of wine (148 mL), or one 1 oz glass of hard liquor (44 mL). General instructions Schedule regular health, dental, and eye exams. Stay current with your vaccines. Tell your health care provider if: You often feel depressed. You have ever been abused or do not feel safe at home. Summary Adopting a healthy lifestyle and getting preventive care are important in promoting health and wellness. Follow your health care provider's instructions about healthy diet, exercising, and getting tested or screened for diseases. Follow your health care provider's instructions on monitoring your cholesterol and blood pressure. This information is not intended to replace  advice given to you by your health care provider. Make sure you discuss any questions you have with your health care provider. Document Revised: 06/18/2020 Document Reviewed: 04/03/2018 Elsevier Patient Education  2022 [employer].

## 2021-01-17 NOTE — Assessment & Plan Note (Signed)
Encouraged ongoing efforts towards smoking cessation - precontemplative. Will refer to lung cancer screening program.

## 2021-01-17 NOTE — Assessment & Plan Note (Addendum)
Ongoing, feels this is progressing. Liquids worse than solids.  Missed GI appt for evaluation 03/2018.  Will refer back to GI.

## 2021-01-17 NOTE — Progress Notes (Signed)
Patient ID: [name], male    DOB: 1970/07/11, 50 y.o.   MRN: [id number]  This visit was conducted in person.  BP 138/82   Pulse 83   Temp 99.8 F (37.7 C) (Temporal)   Ht 5' 7.5" (1.715 m)   Wt 145 lb (65.8 kg)   SpO2 94%   BMI 22.38 kg/m    CC: CPE Subjective:   HPI: [name] is a 50 y.o. male presenting on 01/17/2021 for Annual Exam   Saw Dr Glori Bickers last month for itchy dry and oozy rash to R foot along with longstanding joint pains. 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 Also continues diclofenac PRN. Planning to see next month. Rash treated with triamcinolone cream. No h/o eczema. Rash comes and goes. Rash recurred 2 wks ago, worse. He continued using cream. Rash has since resolved.   Notes ongoing dysphagia progressively worsening. No significant GERD. Had normal barium swallow 2011, known hiatal hernia.   Preventative: Colon cancer screening - discussed. Request colonoscopy.  Prostate cancer screening - no nocturia. No fmhx. No prostate symptoms. Check PSA today  Lung cancer screening - discussed, will refer Flu shot - declines COVID vaccine - declines Tdap 10/2017 Pneumovax 06/2017 Shingrix - discussed Seat belt use discussed Sunscreen use discussed. No changing moles on skin. Father with skin cancer to nose.  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 Interim medical history since our last visit reviewed. Allergies and medications reviewed and updated. Outpatient Medications Prior to Visit  Medication Sig Dispense Refill   Aspirin-Salicylamide-Caffeine  (ARTHRITIS STRENGTH BC POWDER PO) Take 1 packet by mouth 2 (two) times daily as needed (for pain).     diclofenac (VOLTAREN) 75 MG EC tablet TAKE 1 TABLET(75 MG) BY MOUTH TWICE DAILY WITH FOOD AS NEEDED FOR MODERATE PAIN with food 30 tablet 0   EPINEPHrine 0.3 mg/0.3 mL IJ SOAJ injection Inject 0.3 mLs (0.3 mg total) into the muscle daily as needed. 2 Device 0   triamcinolone ointment (KENALOG) 0.5 % Apply 1 application topically 2 (two) times daily. To rash on foot 30 g 0   No facility-administered medications prior to visit.     Per HPI unless specifically indicated in ROS section below Review of Systems  Constitutional:  Positive for appetite change (decreased). Negative for activity change, chills, fatigue, fever and unexpected weight change.  HENT:  Negative for hearing loss.   Eyes:  Negative for visual disturbance.  Respiratory:  Negative for cough, chest tightness, shortness of breath and wheezing.   Cardiovascular:  Negative for chest pain, palpitations and leg swelling.  Gastrointestinal:  Negative for abdominal distention, abdominal pain, blood in stool, constipation, diarrhea, nausea and vomiting.       Abdominal bloating, dysphagia  Genitourinary:  Negative for difficulty urinating and hematuria.  Musculoskeletal:  Positive for arthralgias. Negative for myalgias and neck pain.  Skin:  Positive for rash.  Neurological:  Positive for dizziness. Negative for seizures, syncope and headaches.  Hematological:  Negative for adenopathy. Does not bruise/bleed easily.  Psychiatric/Behavioral:  Negative for dysphoric mood.  The patient is not nervous/anxious.    Objective:  BP 138/82   Pulse 83   Temp 99.8 F (37.7 C) (Temporal)   Ht 5' 7.5" (1.715 m)   Wt 145 lb (65.8 kg)   SpO2 94%   BMI 22.38 kg/m   Wt Readings from Last 3 Encounters:  01/17/21 145 lb (65.8 kg)  12/08/20 147 lb (66.7 kg)  07/28/20 143 lb 4 oz (65 kg)      Physical Exam Vitals and nursing note reviewed.   Constitutional:      General: He is not in acute distress.    Appearance: Normal appearance. He is well-developed. He is not ill-appearing.  HENT:     Head: Normocephalic and atraumatic.     Right Ear: Hearing, tympanic membrane, ear canal and external ear normal.     Left Ear: Hearing, tympanic membrane, ear canal and external ear normal.  Eyes:     General: No scleral icterus.    Extraocular Movements: Extraocular movements intact.     Conjunctiva/sclera: Conjunctivae normal.     Pupils: Pupils are equal, round, and reactive to light.  Neck:     Thyroid: No thyroid mass or thyromegaly.  Cardiovascular:     Rate and Rhythm: Normal rate and regular rhythm.     Pulses: Normal pulses.          Radial pulses are 2+ on the right side and 2+ on the left side.     Heart sounds: Normal heart sounds. No murmur heard. Pulmonary:     Effort: Pulmonary effort is normal. No respiratory distress.     Breath sounds: Normal breath sounds. No wheezing, rhonchi or rales.  Abdominal:     General: Bowel sounds are normal. There is no distension.     Palpations: Abdomen is soft. There is no mass.     Tenderness: There is no abdominal tenderness. There is no guarding or rebound.     Hernia: No hernia is present.  Musculoskeletal:        General: Normal range of motion.     Cervical back: Normal range of motion and neck supple.     Right lower leg: No edema.     Left lower leg: No edema.  Lymphadenopathy:     Cervical: No cervical adenopathy.  Skin:    General: Skin is warm and dry.     Findings: Rash present.     Comments:  Maceration to interdigital space between L 4/5th toes Residual scarring to L 4th toe as well as to dorsal 1st MTPJ  Neurological:     General: No focal deficit present.     Mental Status: He is alert and oriented to person, place, and time.  Psychiatric:        Mood and Affect: Mood normal.        Behavior: Behavior normal.        Thought Content: Thought content  normal.        Judgment: Judgment normal.      Results for orders placed or performed in visit on 12/24/20  Hepatitis C antibody  Result Value Ref Range   Hepatitis C Ab NON-REACTIVE NON-REACTIVE   SIGNAL TO CUT-OFF 0.00 <1.00  Lipid panel  Result Value Ref Range   Cholesterol 208 (H) <200 mg/dL   HDL 59 > OR = 40 mg/dL   Triglycerides 145 <150 mg/dL   LDL Cholesterol (Calc) 123 (H) mg/dL (calc)   Total CHOL/HDL Ratio 3.5 <5.0 (calc)  Non-HDL Cholesterol (Calc) 149 (H) <130 mg/dL (calc)  Uric acid  Result Value Ref Range   Uric Acid, Serum 5.0 4.0 - 8.0 mg/dL   No results found for: PSA1, PSA Lab Results  Component Value Date   TSH 0.506 06/13/2017      Depression screen [facility] 2/9 01/17/2021 08/07/2018 03/15/2018 03/15/2018 06/28/2017  Decreased Interest 0 0 0 0 0  Down, Depressed, Hopeless 1 0 1 1 0  PHQ - 2 Score 1 0 1 1 0  Altered sleeping 2 3 - - -  Tired, decreased energy 0 0 - - -  Change in appetite 2 1 - - -  Feeling bad or failure about yourself  0 0 - - -  Trouble concentrating 0 0 - - -  Moving slowly or fidgety/restless 0 0 - - -  Suicidal thoughts 0 0 - - -  PHQ-9 Score 5 4 - - -    GAD 7 : Generalized Anxiety Score 01/17/2021 08/07/2018 06/28/2017  Nervous, Anxious, on Edge 0 0 1  Control/stop worrying 0 0 1  Worry too much - different things 0 0 2  Trouble relaxing 0 0 2  Restless 0 1 0  Easily annoyed or irritable 1 1 0  Afraid - awful might happen 1 0 2  Total GAD 7 Score '2 2 8   ' Assessment & Plan:  This visit occurred during the SARS-CoV-2 public health emergency.  Safety protocols were in place, including screening questions prior to the visit, additional usage of staff PPE, and extensive cleaning of exam room while observing appropriate contact time as indicated for disinfecting solutions.   Problem List Items Addressed This Visit     Health maintenance examination - Primary (Chronic)    Preventative protocols reviewed and updated unless pt  declined. Discussed healthy diet and lifestyle.       Habitual alcohol use    Encouraged ongoing cessation attempts      Smoker    Encouraged ongoing efforts towards smoking cessation - precontemplative. Will refer to lung cancer screening program.       Relevant Orders   Ambulatory Referral for Lung Cancer Scre   Dysphagia    Ongoing, feels this is progressing. Liquids worse than solids.  Missed GI appt for evaluation 03/2018.  Will refer back to GI.       Relevant Orders   Ambulatory referral to Gastroenterology   History of essential hypertension    BP stable on exam.      HLD (hyperlipidemia)    Stable period off medication. The 10-year ASCVD risk score (Arnett DK, et al., 2019) is: 8.3%   Values used to calculate the score:     Age: 32 years     Sex: Male     Is Non-Hispanic African American: No     Diabetic: No     Tobacco smoker: Yes     Systolic Blood Pressure: 552 mmHg     Is BP treated: No     HDL Cholesterol: 59 mg/dL     Total Cholesterol: 208 mg/dL       Relevant Orders   TSH   Rash of foot    ?eczmea vs tinea given maceration also present - rec treat with lotrimin BID x 3-4 wks, update with effect.       Joint swelling    Recent urate normal       ANA positive    Discussed. Possible false positive. Has upcoming rheum eval planned.  Other Visit Diagnoses     Special screening for malignant neoplasms, colon       Relevant Orders   Ambulatory referral to Gastroenterology   Special screening for malignant neoplasm of prostate       Relevant Orders   PSA        No orders of the defined types were placed in this encounter.  Orders Placed This Encounter  Procedures   PSA   TSH   Ambulatory Referral for Lung Cancer Scre    Referral Priority:   Routine    Referral Type:   Consultation    Referral Reason:   Specialty Services Required    Number of Visits Requested:   1   Ambulatory referral to Gastroenterology    Referral  Priority:   Routine    Referral Type:   Consultation    Referral Reason:   Specialty Services Required    Number of Visits Requested:   1    Patient instructions: PSA blood test today.  We will refer you for colonoscopy and for lung cancer screening program.  Work on quitting smoking and decreased alcohol use.  Schedule dentist and eye exam.  Consider shingrix vaccine.  For foot - start lotrimin (clotrimazole) twice daily for 3-4 weeks. Use between toes as well.  Return as needed or in 1 year for next physical.   Follow up plan: Return in about 1 year (around 01/17/2022), or if symptoms worsen or fail to improve, for annual exam, prior fasting for blood work.  Ria Bush, MD

## 2021-01-17 NOTE — Assessment & Plan Note (Signed)
Preventative protocols reviewed and updated unless pt declined. Discussed healthy diet and lifestyle.  

## 2021-01-17 NOTE — Assessment & Plan Note (Signed)
Discussed. Possible false positive. Has upcoming rheum eval planned.
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
## 2021-02-02 ENCOUNTER — Ambulatory Visit (INDEPENDENT_AMBULATORY_CARE_PROVIDER_SITE_OTHER): Payer: BC Managed Care – PPO

## 2021-02-02 ENCOUNTER — Encounter: Payer: Self-pay | Admitting: Internal Medicine

## 2021-02-02 ENCOUNTER — Other Ambulatory Visit: Payer: Self-pay

## 2021-02-02 ENCOUNTER — Ambulatory Visit (INDEPENDENT_AMBULATORY_CARE_PROVIDER_SITE_OTHER): Payer: BC Managed Care – PPO | Admitting: Internal Medicine

## 2021-02-02 VITALS — BP 121/90 | HR 105 | Ht 68.5 in | Wt 145.4 lb

## 2021-02-02 DIAGNOSIS — M2540 Effusion, unspecified joint: Secondary | ICD-10-CM

## 2021-02-02 DIAGNOSIS — I7300 Raynaud's syndrome without gangrene: Secondary | ICD-10-CM | POA: Diagnosis not present

## 2021-02-02 DIAGNOSIS — M25532 Pain in left wrist: Secondary | ICD-10-CM

## 2021-02-02 DIAGNOSIS — R768 Other specified abnormal immunological findings in serum: Secondary | ICD-10-CM | POA: Diagnosis not present

## 2021-02-02 DIAGNOSIS — R21 Rash and other nonspecific skin eruption: Secondary | ICD-10-CM

## 2021-02-03 LAB — C3 AND C4
C3 Complement: 108 mg/dL (ref 82–185)
C4 Complement: 21 mg/dL (ref 15–53)

## 2021-02-03 LAB — RNP ANTIBODY: Ribonucleic Protein(ENA) Antibody, IgG: <1 AI

## 2021-02-03 LAB — ANTI-DNA ANTIBODY, DOUBLE-STRANDED: ds DNA Ab: <1 IU/mL

## 2021-02-22 ENCOUNTER — Ambulatory Visit (INDEPENDENT_AMBULATORY_CARE_PROVIDER_SITE_OTHER): Payer: BC Managed Care – PPO | Admitting: Internal Medicine

## 2021-02-22 ENCOUNTER — Other Ambulatory Visit: Payer: Self-pay

## 2021-02-22 ENCOUNTER — Encounter: Payer: Self-pay | Admitting: Internal Medicine

## 2021-02-22 VITALS — BP 124/71 | HR 87 | Resp 15 | Ht 69.0 in | Wt 147.0 lb

## 2021-02-22 DIAGNOSIS — I7300 Raynaud's syndrome without gangrene: Secondary | ICD-10-CM

## 2021-02-22 DIAGNOSIS — Z91030 Bee allergy status: Secondary | ICD-10-CM

## 2021-02-22 DIAGNOSIS — M25532 Pain in left wrist: Secondary | ICD-10-CM | POA: Diagnosis not present

## 2021-02-22 DIAGNOSIS — M159 Polyosteoarthritis, unspecified: Secondary | ICD-10-CM | POA: Diagnosis not present

## 2021-02-22 DIAGNOSIS — M25572 Pain in left ankle and joints of left foot: Secondary | ICD-10-CM

## 2021-02-22 DIAGNOSIS — R768 Other specified abnormal immunological findings in serum: Secondary | ICD-10-CM | POA: Diagnosis not present

## 2021-02-22 MED ORDER — EPINEPHRINE 0.3 MG/0.3ML IJ SOAJ: 0.3000 mg | Freq: Every day | INTRAMUSCULAR | 0 refills | Status: DC | PRN
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
## 2021-02-22 NOTE — Patient Instructions (Addendum)
I think it would be beneficial to see an expert for your hands and feet problems since they are painful for years and with decreased function. I will refer you for these you shoulder hear something within a week or two regarding scheduling appointments.  I will send prescription for the epi-pen.  Lab results are negative for markers of systemic inflammatory disease. You do have osteoarthritis in multiple areas. For osteoarthritis several treatment options may be beneficial: - Topical antiinflammatory medicine such as diclofenac or Voltaren can be applied to  affected area as needed but may be less effective than oral antiinflammatory medicine. Topical analgesics containing CBD, menthol, or lidocaine can be tried. Capsaicin containing treatments are recommended against for the hand.  - Oral nonsteroidal antiinflammatory medication such as ibuprofen, aleve, celebrex, or mobic are very helpful for osteoarthritis but can cause side effects such as stomach ulcers or hypertension with prolonged use. These should be taken intermittently or as needed, and always taken with food.  - Other oral supplements such as glucosamine chondroitin containing OTC treatments such as osteo bi-flex or other brands do not have strong data supporting effectiveness but can be helpful for some individuals and have no major side effects. Turmeric has some antiinflammatory effect similar to NSAID medications and may help, if taken as a supplement should not be taken above recommended doses.  - Compressive gloves can be helpful to support the thumb joint especially if hurting with certain activities.  - Occupational therapy referral can discuss exercises or activity modification to improve symptoms or strength if needed.  - Local steroid injection is an option if symptoms become worse and not controlled by the above options.

## 2021-07-11 ENCOUNTER — Other Ambulatory Visit: Payer: Self-pay

## 2021-07-11 ENCOUNTER — Ambulatory Visit (INDEPENDENT_AMBULATORY_CARE_PROVIDER_SITE_OTHER): Payer: BC Managed Care – PPO | Admitting: Family Medicine

## 2021-07-11 ENCOUNTER — Ambulatory Visit (INDEPENDENT_AMBULATORY_CARE_PROVIDER_SITE_OTHER)
Admission: RE | Admit: 2021-07-11 | Discharge: 2021-07-11 | Disposition: A | Discharge status: Home / Self Care | Payer: BC Managed Care – PPO | Source: Ambulatory Visit | Attending: Family Medicine | Admitting: Family Medicine

## 2021-07-11 VITALS — BP 128/80 | HR 89 | Temp 98.7°F | Ht 69.0 in | Wt 150.2 lb

## 2021-07-11 DIAGNOSIS — F17200 Nicotine dependence, unspecified, uncomplicated: Secondary | ICD-10-CM | POA: Diagnosis not present

## 2021-07-11 DIAGNOSIS — M67951 Unspecified disorder of synovium and tendon, right thigh: Secondary | ICD-10-CM | POA: Diagnosis not present

## 2021-07-11 DIAGNOSIS — M25551 Pain in right hip: Secondary | ICD-10-CM

## 2021-07-11 DIAGNOSIS — G5701 Lesion of sciatic nerve, right lower limb: Secondary | ICD-10-CM

## 2021-07-11 DIAGNOSIS — Z1211 Encounter for screening for malignant neoplasm of colon: Secondary | ICD-10-CM

## 2021-07-11 MED ORDER — PREDNISONE 20 MG PO TABS: ORAL_TABLET | ORAL | 0 refills | Status: DC

## 2021-07-11 NOTE — Progress Notes (Signed)
? ? ?[name] T. Loxley Schmale, MD, [facility] ?Therapist, music at [facility] ?[address] ?[place], [address] ? ?Phone: [phone]  FAX: [phone] ? ?NICHOLES HIBLER - 51 y.o. male  MRN [id number]  Date of Birth: Jul 23, 1970 ? ?Date: 07/11/2021  PCP: Terry Bush, MD  Referral: Terry Bush, MD ? ?Chief Complaint  ?Patient presents with  ? Hip Pain  ?  R x 1 week   ? ? ?This visit occurred during the SARS-CoV-2 public health emergency.  Safety protocols were in place, including screening questions prior to the visit, additional usage of staff PPE, and extensive cleaning of exam room while observing appropriate contact time as indicated for disinfecting solutions.  ? ?Subjective:  ? ?[name] is a 51 y.o. very pleasant male patient with Body mass index is 22.19 kg/m?. who presents with the following: ? ?I remember [name] from years in the past, and he is here today with some acute ongoing right-sided hip pain.  This is really been ongoing now for on the order of a few weeks, this is acutely gotten worse over the last week. ? ?He does note that when he picks up his hip to ambulate it does hurt, but this is primarily in the posterior aspect.  When he is sitting, he does not have any pain.  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  He does think that going up and down stairs does exacerbate this. ? ?He does have pain when he gets to work and is walking around, and he is have a very walking labor-intensive career. ? ?He has tried some Aleve, and this has helped somewhat, and he is also tried multiple other nonpharmacological modalities including heat, warm baths, massage, none of this is helped. ? ?He does have a history of having sciatica multiple times in the past, this does not really feel like this at all.  He denies any radicular pain, numbness or tingling.  He does not have any back pain. ? ?Picking up his R hip will hurt. ?Sitting is  ok. ?Did not do anything that he can think about. ?Has been walking stairs three flights of stairs all the tie.  ? ?[facility] he gets to work, will have to sit down.  Shorter amount of walking.  ? ?Does not feel like sciatica.  ? ?Has tried some alleve. ?Tried a hot bath.  ?Nothing has helped.  ? ? ? ?Review of Systems is noted in the HPI, as appropriate  ? ?Objective:  ? ?BP 128/80   Pulse 89   Temp 98.7 ?F (37.1 ?C) (Oral)   Ht '5\' 9"'$  (1.753 m)   Wt 150 lb 4 oz (68.2 kg)   SpO2 97%   BMI 22.19 kg/m?  ? ? ?HIP EXAM: SIDE: R ?ROM: Abduction, Flexion, Internal and External range of motion: Full ?Pain with terminal IROM and EROM: Some pain with terminal E ROM ?GTB: NT ?SLR: NEG ?Knees: No effusion ?FABER: NT ?REVERSE FABER: NT, neg ?Piriformis: Pain with direct palpation as well as pain along the posterior pelvic rim ?Str: flexion: 5/5 ?abduction: 5/5 ?adduction: 5/5 ?Strength testing non-tender ?  ? ?Radiology: ?No results found.  ? ?Assessment and Plan:  ? ?  ICD-10-CM   ?1. Tendinopathy of right gluteus medius  M67.951   ?  ?2. Acute right hip pain  M25.551 DG HIP UNILAT WITH PELVIS 2-3 VIEWS RIGHT  ?  ?3. Screening for malignant neoplasm  of colon  Z12.11 Ambulatory referral to Gastroenterology  ?  ?4. Smoker  F17.200 Ambulatory Referral Lung Cancer Screening [facility]  ?  ?5. Piriformis syndrome, right  G57.01   ?  ? ?Xray, hip series ?Indication: pain ?Findings: No occult fracture or dislocation. No signficant osteoarthritic changes.  ?Electronically Signed  By: Terry Loffler, MD On: 07/11/2021  2:00 PM EDT  ? ?Think his ongoing hip pain for the last few weeks is secondary to some overuse tendinopathy at the right hip.  Clinically consistent with gluteus medial medius/minimus tendinopathy combined with piriformis syndrome. ? ?I reviewed some rehab with basic stretching including pigeon pose and reverse pigeon pose.  Given the intensity, I am going to pulsed him with some steroids, and I think a few  days out of work for recovery is reasonable. ? ?He also requests referral for colonoscopy as well as ambulatory lung screening. ? ?Meds ordered this encounter  ?Medications  ? predniSONE (DELTASONE) 20 MG tablet  ?  Sig: 2 tabs po daily for 5 days, then 1 tab po daily for 5 days  ?  Dispense:  15 tablet  ?  Refill:  0  ? ?There are no discontinued medications. ?Orders Placed This Encounter  ?Procedures  ? DG HIP UNILAT WITH PELVIS 2-3 VIEWS RIGHT  ? Ambulatory referral to Gastroenterology  ? Ambulatory Referral Lung Cancer Screening [facility]  ? ? ?Follow-up: No follow-ups on file. ? ?Dragon Medical One speech-to-text software was used for transcription in this dictation.  Possible transcriptional errors can occur using Editor, commissioning.  ? ?Signed, ? ?Anfernee Peschke T. Timika Muench, MD ? ? ?Outpatient Encounter Medications as of 07/11/2021  ?Medication Sig  ? Aspirin-Salicylamide-Caffeine (ARTHRITIS STRENGTH BC POWDER PO) Take 1 packet by mouth 2 (two) times daily as needed (for pain).  ? diclofenac (VOLTAREN) 75 MG EC tablet TAKE 1 TABLET(75 MG) BY MOUTH TWICE DAILY WITH FOOD AS NEEDED FOR MODERATE PAIN with food  ? EPINEPHrine 0.3 mg/0.3 mL IJ SOAJ injection Inject 0.3 mg into the muscle daily as needed.  ? predniSONE (DELTASONE) 20 MG tablet 2 tabs po daily for 5 days, then 1 tab po daily for 5 days  ? triamcinolone ointment (KENALOG) 0.5 % Apply 1 application topically 2 (two) times daily. To rash on foot  ? ?No facility-administered encounter medications on file as of 07/11/2021.  ?  ?

## 2021-07-12 ENCOUNTER — Encounter: Payer: Self-pay | Admitting: Family Medicine

## 2021-08-23 ENCOUNTER — Other Ambulatory Visit: Payer: Self-pay

## 2021-08-23 DIAGNOSIS — Z87891 Personal history of nicotine dependence: Secondary | ICD-10-CM

## 2021-08-23 DIAGNOSIS — Z122 Encounter for screening for malignant neoplasm of respiratory organs: Secondary | ICD-10-CM

## 2021-08-23 DIAGNOSIS — F17210 Nicotine dependence, cigarettes, uncomplicated: Secondary | ICD-10-CM

## 2021-08-29 ENCOUNTER — Telehealth: Payer: Self-pay | Admitting: Family Medicine

## 2021-08-29 DIAGNOSIS — R1310 Dysphagia, unspecified: Secondary | ICD-10-CM

## 2021-08-29 DIAGNOSIS — Z1211 Encounter for screening for malignant neoplasm of colon: Secondary | ICD-10-CM

## 2021-08-29 NOTE — Telephone Encounter (Signed)
Reason for Referral Request: In need of one due to pt's age. ? ?Has patient been seen PCP for this complaint? ?No, Dr. Danise Mina is aware ? ?Patient scheduled on: 09/05/2021, pt is coming in to get seen. Last apt was on 01/17/2021 with Dr. Danise Mina. ? ?Referral for which specialty: Colonoscopy  ? ?Preferred office/provider: N/A ? ?  ?

## 2021-08-30 ENCOUNTER — Encounter: Payer: Self-pay | Admitting: Internal Medicine

## 2021-08-30 NOTE — Telephone Encounter (Signed)
Patient notified as instructed by telephone and verbalized understanding. Patient stated the last time that he called GI they told him that he would need to call his PCP. Patient was given the telephone number and advised to tell them that his PCP has already placed the referral. ?Patient stated the only reason he was coming in next week was to get a referral. ?Appointment cancelled since not needed.  ? ?

## 2021-08-30 NOTE — Telephone Encounter (Signed)
GI referral placed for colonoscopy and dysphagia. ?This is 3rd GI referral placed in the past year. ?He should call [facility] to schedule an appointment at [phone].   ?No need for appointment if that is all that he needed. ?

## 2021-09-05 ENCOUNTER — Ambulatory Visit: Payer: BC Managed Care – PPO | Admitting: Family Medicine

## 2021-09-09 ENCOUNTER — Encounter: Payer: Self-pay | Admitting: Acute Care

## 2021-09-09 ENCOUNTER — Ambulatory Visit (INDEPENDENT_AMBULATORY_CARE_PROVIDER_SITE_OTHER): Payer: BC Managed Care – PPO | Admitting: Acute Care

## 2021-09-09 DIAGNOSIS — F17210 Nicotine dependence, cigarettes, uncomplicated: Secondary | ICD-10-CM

## 2021-09-09 NOTE — Patient Instructions (Signed)
Thank you for participating in the [facility] Lung Cancer Screening Program. It was our pleasure to meet you today. We will call you with the results of your scan within the next few days. Your scan will be assigned a Lung RADS category score by the physicians reading the scans.  This Lung RADS score determines follow up scanning.  See below for description of categories, and follow up screening recommendations. We will be in touch to schedule your follow up screening annually or based on recommendations of our providers. We will fax a copy of your scan results to your Primary Care Physician, or the physician who referred you to the program, to ensure they have the results. Please call the office if you have any questions or concerns regarding your scanning experience or results.  Our office number is [phone]. Please speak with Denise Phelps, RN. , or  Denise Buckner RN, They are  our Lung Cancer Screening RN.'s If They are unavailable when you call, Please leave a message on the voice mail. We will return your call at our earliest convenience.This voice mail is monitored several times a day.  Remember, if your scan is normal, we will scan you annually as long as you continue to meet the criteria for the program. (Age 55-77, Current smoker or smoker who has quit within the last 15 years). If you are a smoker, remember, quitting is the single most powerful action that you can take to decrease your risk of lung cancer and other pulmonary, breathing related problems. We know quitting is hard, and we are here to help.  Please let us know if there is anything we can do to help you meet your goal of quitting. If you are a former smoker, congratulations. We are proud of you! Remain smoke free! Remember you can refer friends or family members through the number above.  We will screen them to make sure they meet criteria for the program. Thank you for helping us take better care of you by  participating in Lung Screening.  You can receive free nicotine replacement therapy ( patches, gum or mints) by calling 1-800-QUIT NOW. Please call so we can get you on the path to becoming  a non-smoker. I know it is hard, but you can do this!  Lung RADS Categories:  Lung RADS 1: no nodules or definitely non-concerning nodules.  Recommendation is for a repeat annual scan in 12 months.  Lung RADS 2:  nodules that are non-concerning in appearance and behavior with a very low likelihood of becoming an active cancer. Recommendation is for a repeat annual scan in 12 months.  Lung RADS 3: nodules that are probably non-concerning , includes nodules with a low likelihood of becoming an active cancer.  Recommendation is for a 6-month repeat screening scan. Often noted after an upper respiratory illness. We will be in touch to make sure you have no questions, and to schedule your 6-month scan.  Lung RADS 4 A: nodules with concerning findings, recommendation is most often for a follow up scan in 3 months or additional testing based on our provider's assessment of the scan. We will be in touch to make sure you have no questions and to schedule the recommended 3 month follow up scan.  Lung RADS 4 B:  indicates findings that are concerning. We will be in touch with you to schedule additional diagnostic testing based on our provider's  assessment of the scan.  Other options for assistance in smoking cessation (   As covered by your insurance benefits)  Hypnosis for smoking cessation  [employer] [phone]  Acupuncture for smoking cessation  [facility] [phone]   

## 2021-09-09 NOTE — Progress Notes (Signed)
Virtual Visit via Telephone Note  I connected with [name] on 03/08/21 at  2:00 PM EST by telephone and verified that I am speaking with the correct person using two identifiers.  Location: Patient: Home Provider: Working from home   I discussed the limitations, risks, security and privacy concerns of performing an evaluation and management service by telephone and the availability of in person appointments. I also discussed with the patient that there may be a patient responsible charge related to this service. The patient expressed understanding and agreed to proceed.  Shared Decision Making Visit Lung Cancer Screening Program [phone])   Eligibility: Age 51 y.o. Pack Years Smoking History Calculation 49 (# packs/per year x # years smoked) Recent History of coughing up blood  no Unexplained weight loss? no ( >Than 15 pounds within the last 6 months ) Prior History Lung / other cancer no (Diagnosis within the last 5 years already requiring surveillance chest CT Scans). Smoking Status Current Smoker Former Smokers: Years since quit: NA  Quit Date: NA  Visit Components: Discussion included one or more decision making aids. yes Discussion included risk/benefits of screening. yes Discussion included potential follow up diagnostic testing for abnormal scans. yes Discussion included meaning and risk of over diagnosis. yes Discussion included meaning and risk of False Positives. yes Discussion included meaning of total radiation exposure. yes  Counseling Included: Importance of adherence to annual lung cancer LDCT screening. yes Impact of comorbidities on ability to participate in the program. yes Ability and willingness to under diagnostic treatment. yes  Smoking Cessation Counseling: Current Smokers:  Discussed importance of smoking cessation. yes Information about tobacco cessation classes and interventions provided to patient. yes Patient provided with "ticket" for LDCT  Scan. yes Symptomatic Patient. yes  Counseling(Intermediate counseling: > three minutes) [address] Diagnosis Code: Tobacco Use Z72.0 Asymptomatic Patient NA  Counseling NA Former Smokers:  Discussed the importance of maintaining cigarette abstinence. yes Diagnosis Code: Personal History of Nicotine Dependence. O67.124 Information about tobacco cessation classes and interventions provided to patient. Yes Patient provided with "ticket" for LDCT Scan. yes Written Order for Lung Cancer Screening with LDCT placed in Epic. Yes (CT Chest Lung Cancer Screening Low Dose W/O CM) [id number] Z12.2-Screening of respiratory organs Z87.891-Personal history of nicotine dependence   I spent 25 minutes of face to face time with him discussing the risks and benefits of lung cancer screening. We viewed a power point together that explained in detail the above noted topics. We took the time to pause the power point at intervals to allow for questions to be asked and answered to ensure understanding. We discussed that he had taken the single most powerful action possible to decrease his risk of developing lung cancer when he quit smoking. I counseled him to remain smoke free, and to contact me if he ever had the desire to smoke again so that I can provide resources and tools to help support the effort to remain smoke free. We discussed the time and location of the scan, and that either  Doroteo Glassman RN or I will call with the results within  24-48 hours of receiving them. He has my card and contact information in the event he needs to speak with me, in addition to a copy of the power point we reviewed as a resource. He verbalized understanding of all of the above and had no further questions upon leaving the office.     I explained to the patient that there has been a  high incidence of coronary artery disease noted on these exams. I explained that this is a non-gated exam therefore degree or severity cannot be determined.  This patient is not on statin therapy. I have asked the patient to follow-up with their PCP regarding any incidental finding of coronary artery disease and management with diet or medication as they feel is clinically indicated. The patient verbalized understanding of the above and had no further questions.   I spent 3 minutes counseling on smoking cessation and the health risks of continued tobacco abuse    Pietra Zuluaga D. Kenton Kingfisher, NP-C [facility] Personal contact information can be found on Amion  09/09/2021, 11:48 AM

## 2021-09-13 ENCOUNTER — Other Ambulatory Visit: Payer: BC Managed Care – PPO

## 2021-09-23 ENCOUNTER — Ambulatory Visit: Payer: BC Managed Care – PPO | Admitting: Internal Medicine

## 2021-09-30 ENCOUNTER — Ambulatory Visit
Admission: RE | Admit: 2021-09-30 | Discharge: 2021-09-30 | Disposition: A | Discharge status: Home / Self Care | Payer: BC Managed Care – PPO | Source: Ambulatory Visit | Attending: Acute Care | Admitting: Acute Care

## 2021-09-30 DIAGNOSIS — F17210 Nicotine dependence, cigarettes, uncomplicated: Secondary | ICD-10-CM

## 2021-09-30 DIAGNOSIS — Z87891 Personal history of nicotine dependence: Secondary | ICD-10-CM

## 2021-09-30 DIAGNOSIS — Z122 Encounter for screening for malignant neoplasm of respiratory organs: Secondary | ICD-10-CM

## 2021-10-04 ENCOUNTER — Other Ambulatory Visit: Payer: Self-pay

## 2021-10-04 DIAGNOSIS — Z87891 Personal history of nicotine dependence: Secondary | ICD-10-CM

## 2021-10-04 DIAGNOSIS — Z122 Encounter for screening for malignant neoplasm of respiratory organs: Secondary | ICD-10-CM

## 2021-10-04 DIAGNOSIS — F17210 Nicotine dependence, cigarettes, uncomplicated: Secondary | ICD-10-CM

## 2021-10-28 ENCOUNTER — Encounter: Payer: Self-pay | Admitting: Internal Medicine

## 2021-10-28 ENCOUNTER — Ambulatory Visit: Payer: BC Managed Care – PPO | Admitting: Internal Medicine

## 2021-10-28 VITALS — BP 110/76 | HR 88 | Ht 67.0 in | Wt 143.5 lb

## 2021-10-28 DIAGNOSIS — R1310 Dysphagia, unspecified: Secondary | ICD-10-CM

## 2021-10-28 DIAGNOSIS — Z1211 Encounter for screening for malignant neoplasm of colon: Secondary | ICD-10-CM

## 2021-10-28 MED ORDER — NA SULFATE-K SULFATE-MG SULF 17.5-3.13-1.6 GM/177ML PO SOLN: 1.0000 | Freq: Once | ORAL | 0 refills | Status: AC

## 2021-10-28 NOTE — Patient Instructions (Signed)
You have been scheduled for an endoscopy and colonoscopy. Please follow the written instructions given to you at your visit today. Please pick up your prep supplies at the pharmacy within the next 1-3 days. If you use inhalers (even only as needed), please bring them with you on the day of your procedure.   If you are age 52 or older, your body mass index should be between 23-30. Your Body mass index is 22.48 kg/m. If this is out of the aforementioned range listed, please consider follow up with your Primary Care Provider.  If you are age 30 or younger, your body mass index should be between 19-25. Your Body mass index is 22.48 kg/m. If this is out of the aformentioned range listed, please consider follow up with your Primary Care Provider.   ________________________________________________________  The [facility] providers would like to encourage you to use [facility] to communicate with providers for non-urgent requests or questions.  Due to long hold times on the telephone, sending your provider a message by [facility] may be a faster and more efficient way to get a response.  Please allow 48 business hours for a response.  Please remember that this is for non-urgent requests.  _______________________________________________________   Due to recent changes in healthcare laws, you may see the results of your imaging and laboratory studies on MyChart before your provider has had a chance to review them.  We understand that in some cases there may be results that are confusing or concerning to you. Not all laboratory results come back in the same time frame and the provider may be waiting for multiple results in order to interpret others.  Please give [place] 48 hours in order for your provider to thoroughly review all the results before contacting the office for clarification of your results.    Thank you for entrusting me with your care and choosing [facility].  Dr.Dorsey
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
## 2021-12-16 ENCOUNTER — Encounter: Payer: BC Managed Care – PPO | Admitting: Internal Medicine

## 2021-12-23 ENCOUNTER — Ambulatory Visit (AMBULATORY_SURGERY_CENTER): Payer: Self-pay | Admitting: *Deleted

## 2021-12-23 VITALS — Ht 67.0 in | Wt 143.2 lb

## 2021-12-23 DIAGNOSIS — Z1211 Encounter for screening for malignant neoplasm of colon: Secondary | ICD-10-CM

## 2021-12-23 DIAGNOSIS — R1310 Dysphagia, unspecified: Secondary | ICD-10-CM

## 2021-12-23 MED ORDER — NA SULFATE-K SULFATE-MG SULF 17.5-3.13-1.6 GM/177ML PO SOLN: 1.0000 | Freq: Once | ORAL | 0 refills | Status: AC

## 2021-12-23 NOTE — Progress Notes (Signed)
No egg or soy allergy known to patient  No issues known to pt with past sedation with any surgeries or procedures Patient denies ever being told they had issues or difficulty with intubation  No FH of Malignant Hyperthermia Pt is not on diet pills Pt is not on  home 02  Pt is not on blood thinners  Pt denies issues with constipation  No A fib or A flutter Have any cardiac testing pending--no Pt instructed to use [employer] or GoodRx for a price reduction on prep    Instructed pt. Not use snuff/chew on day of procedure.

## 2021-12-30 ENCOUNTER — Ambulatory Visit (INDEPENDENT_AMBULATORY_CARE_PROVIDER_SITE_OTHER): Payer: BC Managed Care – PPO | Admitting: Family Medicine

## 2021-12-30 ENCOUNTER — Encounter: Payer: Self-pay | Admitting: Family Medicine

## 2021-12-30 ENCOUNTER — Ambulatory Visit (INDEPENDENT_AMBULATORY_CARE_PROVIDER_SITE_OTHER)
Admission: RE | Admit: 2021-12-30 | Discharge: 2021-12-30 | Disposition: A | Discharge status: Home / Self Care | Payer: BC Managed Care – PPO | Source: Ambulatory Visit | Attending: Family Medicine | Admitting: Family Medicine

## 2021-12-30 ENCOUNTER — Encounter: Payer: Self-pay | Admitting: Internal Medicine

## 2021-12-30 VITALS — BP 124/80 | HR 91 | Temp 97.7°F | Ht 67.0 in | Wt 143.5 lb

## 2021-12-30 DIAGNOSIS — M25511 Pain in right shoulder: Secondary | ICD-10-CM | POA: Diagnosis not present

## 2021-12-30 DIAGNOSIS — R21 Rash and other nonspecific skin eruption: Secondary | ICD-10-CM

## 2021-12-30 MED ORDER — DICLOFENAC SODIUM 75 MG PO TBEC: DELAYED_RELEASE_TABLET | ORAL | 1 refills | Status: DC

## 2021-12-30 MED ORDER — TRIAMCINOLONE ACETONIDE 0.5 % EX OINT: 1.0000 | TOPICAL_OINTMENT | Freq: Two times a day (BID) | CUTANEOUS | 1 refills | Status: DC

## 2021-12-30 NOTE — Assessment & Plan Note (Addendum)
Story suspicious for RTC injury, exam consistent with RTC vs biceps tendonitis + impingement. Check xrays today. Rx diclofenac BID with meals, further supportive home measures. rec sports med eval if ongoing despite above. Pt agrees with plan.  Not consistent with frozen shoulder or shoulder bursitis.

## 2021-12-30 NOTE — Progress Notes (Signed)
Patient ID: [name], male    DOB: 1970-10-17, 51 y.o.   MRN: [id number]  This visit was conducted in person.  BP 124/80   Pulse 91   Temp 97.7 F (36.5 C) (Temporal)   Ht '5\' 7"'$  (1.702 m)   Wt 143 lb 8 oz (65.1 kg)   SpO2 95%   BMI 22.48 kg/m    CC: R shoulder pain, check foot rash  Subjective:   HPI: [name] is a 51 y.o. male presenting on 12/30/2021 for Shoulder Pain (C/o R shoulder pain.  Started about 1 wk ago. Denies injury. ) and Rash (C/o rash on anterior L foot.  Has used triamcinolone in past, helpful.  Requests refill. )   R shoulder pain started 10 days ago - started after drilling concrete for 2 days. Worse pain with overhead movement. Points to anterior R shoulder. Denies inciting trauma/injury or fall. Hasn't tried any medications for this.   Chronic L dorsal foot rash, present for years. Manages with triamcinolone steroid cream sparing use with benefit, requests refill.   Saw rheum for polyarthralgias, never saw specialist.   Upcoming EGD/colonosocpy 01/13/2022     Relevant past medical, surgical, family and social history reviewed and updated as indicated. Interim medical history since our last visit reviewed. Allergies and medications reviewed and updated. Outpatient Medications Prior to Visit  Medication Sig Dispense Refill   Aspirin-Salicylamide-Caffeine (ARTHRITIS STRENGTH BC POWDER PO) Take 1 packet by mouth 2 (two) times daily as needed (for pain).     EPINEPHrine 0.3 mg/0.3 mL IJ SOAJ injection Inject 0.3 mg into the muscle daily as needed. 2 each 0   triamcinolone ointment (KENALOG) 0.5 % Apply 1 application topically 2 (two) times daily. To rash on foot 30 g 0   diclofenac (VOLTAREN) 75 MG EC tablet TAKE 1 TABLET(75 MG) BY MOUTH TWICE DAILY WITH FOOD AS NEEDED FOR MODERATE PAIN with food (Patient not taking: Reported on 10/28/2021) 30 tablet 0   diclofenac Sodium (VOLTAREN) 1 % GEL APPLY 4 GRAMS TO THE AFFECTED AREA BY TOPICAL ROUTE 4 TIMES PER DAY  (Patient not taking: Reported on 12/23/2021)     predniSONE (DELTASONE) 20 MG tablet 2 tabs po daily for 5 days, then 1 tab po daily for 5 days (Patient not taking: Reported on 10/28/2021) 15 tablet 0   No facility-administered medications prior to visit.     Per HPI unless specifically indicated in ROS section below Review of Systems  Objective:  BP 124/80   Pulse 91   Temp 97.7 F (36.5 C) (Temporal)   Ht '5\' 7"'$  (1.702 m)   Wt 143 lb 8 oz (65.1 kg)   SpO2 95%   BMI 22.48 kg/m   Wt Readings from Last 3 Encounters:  12/30/21 143 lb 8 oz (65.1 kg)  12/23/21 143 lb 3.2 oz (65 kg)  10/28/21 143 lb 8 oz (65.1 kg)      Physical Exam Vitals and nursing note reviewed.  Constitutional:      Appearance: Normal appearance. He is not ill-appearing.  Musculoskeletal:     Comments:  L shoulder pain R shoulder exam: No deformity of shoulders on inspection. No pain with palpation of shoulder landmarks. Limited ROM in abduction and forward flexion past midline due to pain, passive ROM without pain. No signficant pain or weakness with testing SITS in ext/int rotation. + pain with empty can sign. ++ Speed test. +++ pain with impingement testing. No pain with rotation of  humeral head in [facility] joint.   Skin:    General: Skin is warm and dry.     Findings: Rash present.          Comments: Chronic blanching maculovesicular rash to L dorsal foot  Neurological:     Mental Status: He is alert.  Psychiatric:        Mood and Affect: Mood normal.        Behavior: Behavior normal.        Assessment & Plan:   Problem List Items Addressed This Visit     Rash of foot    Of unclear cause. Triamcinolone has previously been helpful. Refilled with customary topical steroid preacutions.  ?tinea component - rec clotrimazole OTC BID x3-4 wks, update with effect. Consider derm eval.       Acute pain of right shoulder - Primary    Story suspicious for RTC injury, exam consistent with RTC vs biceps  tendonitis + impingement. Check xrays today. Rx diclofenac BID with meals, further supportive home measures. rec sports med eval if ongoing despite above. Pt agrees with plan.  Not consistent with frozen shoulder or shoulder bursitis.       Relevant Orders   DG Shoulder Right     Meds ordered this encounter  Medications   triamcinolone ointment (KENALOG) 0.5 %    Sig: Apply 1 Application topically 2 (two) times daily. To rash on foot    Dispense:  30 g    Refill:  1   diclofenac (VOLTAREN) 75 MG EC tablet    Sig: TAKE 1 TABLET(75 MG) BY MOUTH TWICE DAILY WITH FOOD AS NEEDED FOR MODERATE PAIN    Dispense:  40 tablet    Refill:  1   Orders Placed This Encounter  Procedures   DG Shoulder Right    Standing Status:   Future    Number of Occurrences:   1    Standing Expiration Date:   12/31/2022    Order Specific Question:   Reason for Exam (SYMPTOM  OR DIAGNOSIS REQUIRED)    Answer:   R shoulder pain with impingement    Order Specific Question:   Preferred imaging location?    Answer:   [facility]    Patient Instructions  Triamcinolone steroid cream refilled today to use as needed. Start clotrimazole (lotrimin) twice daily for 3-4 weeks - to see if any improvement.  For right shoulder - xray today. Possible impingement syndrome (pinching of shoulder structures) vs rotator cuff injury. Start taking anti inflammatory - diclofenac '75mg'$  twice daily with meals for 1 week then as needed. If not improving with this, schedule appointment with Dr Lorelei Pont.  Good to see you today.   Follow up plan: Return if symptoms worsen or fail to improve.  Ria Bush, MD

## 2021-12-30 NOTE — Assessment & Plan Note (Signed)
Of unclear cause. Triamcinolone has previously been helpful. Refilled with customary topical steroid preacutions.  ?tinea component - rec clotrimazole OTC BID x3-4 wks, update with effect. Consider derm eval.

## 2021-12-30 NOTE — Patient Instructions (Addendum)
Triamcinolone steroid cream refilled today to use as needed. Start clotrimazole (lotrimin) twice daily for 3-4 weeks - to see if any improvement.  For right shoulder - xray today. Possible impingement syndrome (pinching of shoulder structures) vs rotator cuff injury. Start taking anti inflammatory - diclofenac '75mg'$  twice daily with meals for 1 week then as needed. If not improving with this, schedule appointment with Dr Lorelei Pont.  Good to see you today.

## 2022-01-11 ENCOUNTER — Telehealth: Payer: Self-pay | Admitting: Family Medicine

## 2022-01-11 NOTE — Telephone Encounter (Signed)
Spoke with pt relaying x-ray results he had not viewed in [place] (Imaging, Result Notes- 12/30/21 below).    Results: Your right shoulder xray showed findings suspicious for rotator cuff injury - if diclofenac not helping, schedule appointment with Dr Lorelei Pont sports medicine for further evaluation.  I offered several OVs with Dr. Lorelei Pont but none worked.  Pt will check with work schedule then call back to schedule with Dr. Lorelei Pont for ongoing R shoulder pain.

## 2022-01-11 NOTE — Telephone Encounter (Signed)
Patient called in and was wanting to follow up regarding his x-ray of his shoulder. He stated it hasn't gotten any better or worse. He stated that when he lifts his arm above his head its hurting. He was wanting to know if he should go ahead and follow up with Dr. Lorelei Pont. He can be reached at [phone]. Thank you!

## 2022-01-13 ENCOUNTER — Encounter: Payer: Self-pay | Admitting: Internal Medicine

## 2022-01-13 ENCOUNTER — Ambulatory Visit (AMBULATORY_SURGERY_CENTER): Payer: BC Managed Care – PPO | Admitting: Internal Medicine

## 2022-01-13 VITALS — BP 126/88 | HR 67 | Temp 98.4°F | Resp 12 | Ht 67.0 in | Wt 143.2 lb

## 2022-01-13 DIAGNOSIS — K219 Gastro-esophageal reflux disease without esophagitis: Secondary | ICD-10-CM | POA: Diagnosis not present

## 2022-01-13 DIAGNOSIS — K225 Diverticulum of esophagus, acquired: Secondary | ICD-10-CM

## 2022-01-13 DIAGNOSIS — D123 Benign neoplasm of transverse colon: Secondary | ICD-10-CM

## 2022-01-13 DIAGNOSIS — Z1211 Encounter for screening for malignant neoplasm of colon: Secondary | ICD-10-CM | POA: Diagnosis not present

## 2022-01-13 DIAGNOSIS — K635 Polyp of colon: Secondary | ICD-10-CM

## 2022-01-13 DIAGNOSIS — K2960 Other gastritis without bleeding: Secondary | ICD-10-CM

## 2022-01-13 DIAGNOSIS — K222 Esophageal obstruction: Secondary | ICD-10-CM

## 2022-01-13 DIAGNOSIS — K2990 Gastroduodenitis, unspecified, without bleeding: Secondary | ICD-10-CM

## 2022-01-13 DIAGNOSIS — R1310 Dysphagia, unspecified: Secondary | ICD-10-CM | POA: Diagnosis not present

## 2022-01-13 DIAGNOSIS — K2970 Gastritis, unspecified, without bleeding: Secondary | ICD-10-CM

## 2022-01-13 DIAGNOSIS — K621 Rectal polyp: Secondary | ICD-10-CM

## 2022-01-13 MED ORDER — SODIUM CHLORIDE 0.9 % IV SOLN: 500.0000 mL | INTRAVENOUS | Status: DC

## 2022-01-13 MED ORDER — OMEPRAZOLE 40 MG PO CPDR: DELAYED_RELEASE_CAPSULE | ORAL | 1 refills | Status: DC
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
## 2022-01-13 NOTE — Op Note (Signed)
[place] Patient Name: [name] Procedure Date: 01/13/2022 1:30 PM MRN: [id number] Endoscopist: Sonny Masters "[name] ,  Age: 51 Referring MD:  Date of Birth: 05-29-1970 Gender: Male Account #: [id number] Procedure:                Colonoscopy Indications:              Screening for colorectal malignant neoplasm Medicines:                Monitored Anesthesia Care Procedure:                Pre-Anesthesia Assessment:                           - Prior to the procedure, a History and Physical                            was performed, and patient medications and                            allergies were reviewed. The patient's tolerance of                            previous anesthesia was also reviewed. The risks                            and benefits of the procedure and the sedation                            options and risks were discussed with the patient.                            All questions were answered, and informed consent                            was obtained. Prior Anticoagulants: The patient has                            taken no previous anticoagulant or antiplatelet                            agents. ASA Grade Assessment: III - A patient with                            severe systemic disease. After reviewing the risks                            and benefits, the patient was deemed in                            satisfactory condition to undergo the procedure.                           After obtaining informed consent, the colonoscope  was passed under direct vision. Throughout the                            procedure, the patient's blood pressure, pulse, and                            oxygen saturations were monitored continuously. The                            PCF-HQ190L Colonoscope was introduced through the                            anus and advanced to the the terminal ileum. The                            colonoscopy was  performed without difficulty. The                            patient tolerated the procedure well. The quality                            of the bowel preparation was good. The terminal                            ileum, ileocecal valve, appendiceal orifice, and                            rectum were photographed. Scope In: 1:58:52 PM Scope Out: 2:22:44 PM Scope Withdrawal Time: 0 hours 21 minutes 47 seconds  Total Procedure Duration: 0 hours 23 minutes 52 seconds  Findings:                 The terminal ileum appeared normal.                           Three sessile polyps were found in the transverse                            colon. The polyps were 3 to 6 mm in size. These                            polyps were removed with a cold snare. Resection                            and retrieval were complete.                           Two sessile polyps were found in the rectum and                            sigmoid colon. The polyps were 3 to 4 mm in size.                            These polyps were  removed with a cold snare.                            Resection and retrieval were complete.                           Non-bleeding internal hemorrhoids were found during                            retroflexion. Complications:            No immediate complications. Estimated Blood Loss:     Estimated blood loss was minimal. Impression:               - The examined portion of the ileum was normal.                           - Three 3 to 6 mm polyps in the transverse colon,                            removed with a cold snare. Resected and retrieved.                           - Two 3 to 4 mm polyps in the rectum and in the                            sigmoid colon, removed with a cold snare. Resected                            and retrieved.                           - Non-bleeding internal hemorrhoids. Recommendation:           - Discharge patient to home (with escort).                           -  Await pathology results.                           - The findings and recommendations were discussed                            with the patient.                           - Return to GI clinic in 6 weeks. Dr Georgian Co "Lyndee Leo" [place],  01/13/2022 2:33:49 PM

## 2022-01-13 NOTE — Op Note (Signed)
[place] Patient Name: Joncarlo Friberg Procedure Date: 01/13/2022 1:31 PM MRN: [id number] Endoscopist: Sonny Masters "Christia Reading ,  Age: 51 Referring MD:  Date of Birth: February 01, 1971 Gender: Male Account #: [id number] Procedure:                Upper GI endoscopy Indications:              Dysphagia Medicines:                Monitored Anesthesia Care Procedure:                Pre-Anesthesia Assessment:                           - Prior to the procedure, a History and Physical                            was performed, and patient medications and                            allergies were reviewed. The patient's tolerance of                            previous anesthesia was also reviewed. The risks                            and benefits of the procedure and the sedation                            options and risks were discussed with the patient.                            All questions were answered, and informed consent                            was obtained. Prior Anticoagulants: The patient has                            taken no previous anticoagulant or antiplatelet                            agents. ASA Grade Assessment: III - A patient with                            severe systemic disease. After reviewing the risks                            and benefits, the patient was deemed in                            satisfactory condition to undergo the procedure.                           After obtaining informed consent, the endoscope was  passed under direct vision. Throughout the                            procedure, the patient's blood pressure, pulse, and                            oxygen saturations were monitored continuously. The                            Endoscope was introduced through the mouth, and                            advanced to the second part of duodenum. The upper                            GI endoscopy was accomplished without  difficulty.                            The patient tolerated the procedure well. Scope In: Scope Out: Findings:                 A non-bleeding diverticulum with a small opening                            and no stigmata of recent bleeding was found at the                            cricopharyngeus.                           One benign-appearing, intrinsic mild stenosis                            (resistance was felt with pushing of the endoscope)                            was found in the distal esophagus. The stenosis was                            traversed. A TTS dilator was passed through the                            scope. Dilation with an 18-19-20 mm balloon dilator                            was performed to 20 mm. Post-dilation inspection                            showed showed some mild mucosal changes but no                            clear tear from the dilation.  Biopsies were taken with a cold forceps in the                            proximal esophagus and in the distal esophagus for                            histology.                           Localized moderate inflammation characterized by                            congestion (edema), erosions and erythema was found                            in the gastric antrum. Biopsies were taken with a                            cold forceps for histology.                           Localized inflammation characterized by congestion                            (edema) and erythema was found in the duodenal bulb. Complications:            No immediate complications. Estimated Blood Loss:     Estimated blood loss was minimal. 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                           - Benign-appearing esophageal stenosis. Dilated.                           - Gastritis. Biopsied.                           - Duodenitis.                           - Biopsies were taken with  a cold forceps for                            histology in the proximal esophagus and in the                            distal esophagus. Recommendation:           - Await pathology results.                           - No ibuprofen, naproxen, or other non-steroidal                            anti-inflammatory drugs.                           -  Use Prilosec (omeprazole) 40 mg PO BID for 8                            weeks.                           - Perform a colonoscopy today. Dr Georgian Co "Lyndee Leo" [place],  01/13/2022 2:31:34 PM

## 2022-01-13 NOTE — Patient Instructions (Signed)
Discharge instructions given. Handouts on polyps,hemorrhoids,Gastritis and a dilatation diet. Prescription sent to pharmacy. Office will call to schedule follow up visit for 6 weeks. YOU HAD AN ENDOSCOPIC PROCEDURE TODAY AT [facility]:   Refer to the procedure report that was given to you for any specific questions about what was found during the examination.  If the procedure report does not answer your questions, please call your gastroenterologist to clarify.  If you requested that your care partner not be given the details of your procedure findings, then the procedure report has been included in a sealed envelope for you to review at your convenience later.  YOU SHOULD EXPECT: Some feelings of bloating in the abdomen. Passage of more gas than usual.  Walking can help get rid of the air that was put into your GI tract during the procedure and reduce the bloating. If you had a lower endoscopy (such as a colonoscopy or flexible sigmoidoscopy) you may notice spotting of blood in your stool or on the toilet paper. If you underwent a bowel prep for your procedure, you may not have a normal bowel movement for a few days.  Please Note:  You might notice some irritation and congestion in your nose or some drainage.  This is from the oxygen used during your procedure.  There is no need for concern and it should clear up in a day or so.  SYMPTOMS TO REPORT IMMEDIATELY:  Following lower endoscopy (colonoscopy or flexible sigmoidoscopy):  Excessive amounts of blood in the stool  Significant tenderness or worsening of abdominal pains  Swelling of the abdomen that is new, acute  Fever of 100F or higher  Following upper endoscopy (EGD)  Vomiting of blood or coffee ground material  New chest pain or pain under the shoulder blades  Painful or persistently difficult swallowing  New shortness of breath  Fever of 100F or higher  Black, tarry-looking stools  For urgent or emergent  issues, a gastroenterologist can be reached at any hour by calling [phone]. Do not use MyChart messaging for urgent concerns.    DIET:  We do recommend a small meal at first, but then you may proceed to your regular diet.  Drink plenty of fluids but you should avoid alcoholic beverages for 24 hours.  ACTIVITY:  You should plan to take it easy for the rest of today and you should NOT DRIVE or use heavy machinery until tomorrow (because of the sedation medicines used during the test).    FOLLOW UP: Our staff will call the number listed on your records the next business day following your procedure.  We will call around 7:15- 8:00 am to check on you and address any questions or concerns that you may have regarding the information given to you following your procedure. If we do not reach you, we will leave a message.     If any biopsies were taken you will be contacted by phone or by letter within the next 1-3 weeks.  Please call us at [phone] if you have not heard about the biopsies in 3 weeks.    SIGNATURES/CONFIDENTIALITY: You and/or your care partner have signed paperwork which will be entered into your electronic medical record.  These signatures attest to the fact that that the information above on your After Visit Summary has been reviewed and is understood.  Full responsibility of the confidentiality of this discharge information lies with you and/or your care-partner.

## 2022-01-13 NOTE — Progress Notes (Signed)
Pt in recovery with monitors in place, VSS. Report given to receiving RN. Bite guard was placed with pt awake to ensure comfort. No dental or soft tissue damage noted. 

## 2022-01-13 NOTE — Progress Notes (Signed)
Called to room to assist during endoscopic procedure.  Patient ID and intended procedure confirmed with present staff. Received instructions for my participation in the procedure from the performing physician.  

## 2022-01-16 ENCOUNTER — Telehealth: Payer: Self-pay

## 2022-01-16 ENCOUNTER — Other Ambulatory Visit (HOSPITAL_COMMUNITY): Payer: Self-pay

## 2022-01-16 NOTE — Telephone Encounter (Signed)
  Follow up Call-     01/13/2022   12:57 PM  Call back number  Post procedure Call Back phone  # [phone]  Permission to leave phone message Yes     Patient questions:  Do you have a fever, pain , or abdominal swelling? No. Pain Score  0 *  Have you tolerated food without any problems? Yes.    Have you been able to return to your normal activities? Yes.    Do you have any questions about your discharge instructions: Diet   No. Medications  No. Follow up visit  No.  Do you have questions or concerns about your Care? No.  Actions: * If pain score is 4 or above: No action needed, pain <4.

## 2022-01-17 ENCOUNTER — Other Ambulatory Visit (HOSPITAL_COMMUNITY): Payer: Self-pay

## 2022-01-20 ENCOUNTER — Encounter: Payer: Self-pay | Admitting: Internal Medicine

## 2022-01-22 NOTE — Progress Notes (Unsigned)
    [name] T. Clodagh Odenthal, MD, [place] at [facility] [name], [address]  Phone: [phone]  FAX: [phone]  [name] - 51 y.o. male  MRN [id number]  Date of Birth: 01-25-1971  Date: 01/23/2022  PCP: Ria Bush, MD  Referral: Ria Bush, MD  No chief complaint on file.  Subjective:   [name] is a 51 y.o. very pleasant male patient with There is no height or weight on file to calculate BMI. who presents with the following:  Pleasant gentleman who presents with some r sided shoulder pain:    Review of Systems is noted in the HPI, as appropriate  Objective:   There were no vitals taken for this visit.  GEN: No acute distress; alert,appropriate. PULM: Breathing comfortably in no respiratory distress PSYCH: Normally interactive.   Laboratory and Imaging Data:  Assessment and Plan:   ***

## 2022-01-23 ENCOUNTER — Encounter: Payer: Self-pay | Admitting: Family Medicine

## 2022-01-23 ENCOUNTER — Ambulatory Visit (INDEPENDENT_AMBULATORY_CARE_PROVIDER_SITE_OTHER): Payer: BC Managed Care – PPO | Admitting: Family Medicine

## 2022-01-23 VITALS — BP 120/80 | HR 76 | Temp 98.2°F | Ht 67.0 in | Wt 143.1 lb

## 2022-01-23 DIAGNOSIS — M25511 Pain in right shoulder: Secondary | ICD-10-CM

## 2022-01-23 DIAGNOSIS — M79675 Pain in left toe(s): Secondary | ICD-10-CM | POA: Diagnosis not present

## 2022-01-23 DIAGNOSIS — M24811 Other specific joint derangements of right shoulder, not elsewhere classified: Secondary | ICD-10-CM | POA: Diagnosis not present

## 2022-02-03 ENCOUNTER — Other Ambulatory Visit: Payer: BC Managed Care – PPO

## 2022-02-09 ENCOUNTER — Telehealth: Payer: Self-pay | Admitting: Family Medicine

## 2022-02-09 NOTE — Telephone Encounter (Signed)
Per appt notes - Auth obtained by Taron with [facility]  [employer] Josem Kaufmann #[id number] [phone] exp 03/25/2022//taron  Nothing further needed.

## 2022-02-09 NOTE — Telephone Encounter (Signed)
Diagnostic radiology and imaging called stating that patient is scheduled for MRI on tomorrow 10/20,he doesn't a prior authorization on file.  Fax:[phone]

## 2022-02-10 ENCOUNTER — Ambulatory Visit
Admission: RE | Admit: 2022-02-10 | Discharge: 2022-02-10 | Disposition: A | Discharge status: Home / Self Care | Payer: BC Managed Care – PPO | Source: Ambulatory Visit | Attending: Family Medicine | Admitting: Family Medicine

## 2022-02-10 DIAGNOSIS — M25511 Pain in right shoulder: Secondary | ICD-10-CM

## 2022-02-10 DIAGNOSIS — M24811 Other specific joint derangements of right shoulder, not elsewhere classified: Secondary | ICD-10-CM

## 2022-02-21 NOTE — Progress Notes (Unsigned)
[name] T. [name], MD, [place] at [facility] [place], [address]  Phone: [phone]  FAX: [phone]  [name] - 51 y.o. male  MRN [id number]  Date of Birth: 1971-03-17  Date: 02/22/2022  PCP: Ria Bush, MD  Referral: Ria Bush, MD  No chief complaint on file.  Subjective:   [name] is a 51 y.o. very pleasant male patient with There is no height or weight on file to calculate BMI. who presents with the following:  Pleasant gentleman comes in today for follow-up of right-sided shoulder pain.  I saw him just less than 1 month ago with some concern that he could have had a rotator cuff tear.  He was having a lot of pain with movement as well as weakness with movements of the right arm and shoulder.  Ultimately did get an MRI of his shoulder, and there was no evidence of a rotator cuff tear.  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    MR Shoulder Right Wo Contrast  Result Date: 02/13/2022 CLINICAL DATA:  Right shoulder pain. Rotator cuff disorder suspected. Suspect full-thickness rotator cuff tear. EXAM: MRI OF THE RIGHT SHOULDER WITHOUT CONTRAST TECHNIQUE: Multiplanar, multisequence MR imaging of the shoulder was performed. No intravenous contrast was administered. COMPARISON:  Right shoulder radiographs 12/30/2021 FINDINGS: Despite efforts by the technologist and patient, moderate motion artifact is present on today's exam and could not be eliminated. This reduces exam sensitivity and specificity. Rotator cuff: Within the limitations of patient motion artifact, no supraspinatus or infraspinatus tendon tear is seen. The subscapularis and teres minor are intact. Muscles: No rotator cuff muscle atrophy, fatty infiltration, or edema. Biceps long head: Within the limitations of patient motion artifact, there appears to be marginal fluid bright signal within the  proximal long head of biceps tendon suggesting a partial-thickness longitudinal split tear (sagittal series 6, images 10 and 11). Acromioclavicular Joint: There are mild-to-moderate degenerative changes of the acromioclavicular joint including joint space narrowing, moderate subchondral marrow edema, and mild peripheral osteophytosis. Type II acromion. No fluid within the subacromial/subdeltoid bursa. Glenohumeral Joint: Mild-to-moderate posterior glenoid cartilage thinning. The humeral head cartilage is not well evaluated secondary to motion artifact. Labrum: Not well evaluated due to motion artifact and lack of intra-articular fluid. Bones:  No acute fracture. Other: None. IMPRESSION: 1. Within the limitations of patient motion artifact, no rotator cuff tear is seen. 2. Partial-thickness longitudinal split tear of the proximal long head of the biceps tendon. 3. Mild-to-moderate degenerative changes of the acromioclavicular joint. Electronically Signed   By: Yvonne Kendall M.D.   On: 02/13/2022 12:17     01/23/2022 Last OV with Owens Loffler, MD  Pleasant gentleman who presents with some r sided shoulder pain:  Has been hurting for about a month.    Generally, he feels pretty fine from a general health standpoint.   He describes an injury where he was reaching out his right hand moving a hard hat, and at that point he developed some severe, acute pain and since then he has been having difficulty abducting his shoulder as well as moving his shoulder in any kind of rotational movements.  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   He has no prior major shoulder injury, fractures, or operative intervention in the affected shoulder.  He did have an extensive clavicle fracture on the left side distantly.   Was reaching out to get a hard hat and felt some severe, acute pain.    Probable 4th toe fracture on the L  side.   Review of Systems is noted in the HPI, as appropriate  Objective:   There were no vitals taken for this visit.  GEN: No acute distress; alert,appropriate. PULM: Breathing comfortably in no respiratory distress PSYCH: Normally interactive.   Laboratory and Imaging Data:  Assessment and Plan:   ***

## 2022-02-22 ENCOUNTER — Ambulatory Visit: Payer: BC Managed Care – PPO | Admitting: Family Medicine

## 2022-02-22 ENCOUNTER — Encounter: Payer: Self-pay | Admitting: Family Medicine

## 2022-02-22 VITALS — BP 120/80 | HR 73 | Temp 98.1°F | Ht 67.0 in | Wt 143.0 lb

## 2022-02-22 DIAGNOSIS — M25511 Pain in right shoulder: Secondary | ICD-10-CM | POA: Diagnosis not present

## 2022-02-22 DIAGNOSIS — S46211A Strain of muscle, fascia and tendon of other parts of biceps, right arm, initial encounter: Secondary | ICD-10-CM

## 2022-02-22 DIAGNOSIS — M7501 Adhesive capsulitis of right shoulder: Secondary | ICD-10-CM | POA: Diagnosis not present

## 2022-02-22 MED ORDER — TRIAMCINOLONE ACETONIDE 40 MG/ML IJ SUSP
40.0000 mg | Freq: Once | INTRAMUSCULAR | Status: AC
  Administered 2022-02-22: 40 mg via INTRA_ARTICULAR

## 2022-04-28 ENCOUNTER — Ambulatory Visit: Payer: BC Managed Care – PPO | Admitting: Family Medicine

## 2022-05-01 ENCOUNTER — Ambulatory Visit (INDEPENDENT_AMBULATORY_CARE_PROVIDER_SITE_OTHER): Payer: BC Managed Care – PPO | Admitting: Family Medicine

## 2022-05-01 ENCOUNTER — Encounter: Payer: Self-pay | Admitting: Family Medicine

## 2022-05-01 ENCOUNTER — Ambulatory Visit: Payer: Self-pay

## 2022-05-01 VITALS — BP 140/80 | HR 97 | Temp 98.6°F | Ht 67.0 in | Wt 146.0 lb

## 2022-05-01 DIAGNOSIS — M79675 Pain in left toe(s): Secondary | ICD-10-CM | POA: Diagnosis not present

## 2022-05-01 DIAGNOSIS — M25511 Pain in right shoulder: Secondary | ICD-10-CM | POA: Diagnosis not present

## 2022-05-01 DIAGNOSIS — M7501 Adhesive capsulitis of right shoulder: Secondary | ICD-10-CM

## 2022-05-01 MED ORDER — PREDNISONE 20 MG PO TABS: ORAL_TABLET | ORAL | 0 refills | Status: DC

## 2022-05-01 NOTE — Progress Notes (Unsigned)
    [name] T. Terry Weedon, MD, [place] at [facility] [place], [address]  Phone: [phone]  FAX: [phone]  [name] - 52 y.o. male  MRN [id number]  Date of Birth: 1970/11/13  Date: 05/01/2022  PCP: Terry Bush, MD  Referral: Terry Bush, MD  Chief Complaint  Patient presents with   Follow-up    Right Shoulder   Subjective:   [name] is a 52 y.o. very pleasant male patient with Body mass index is 22.87 kg/m. who presents with the following:  He presents with follow-up right-sided shoulder pain, and he has been struggling with adhesive capsulitis.  He also has a longitudinal split tear of the proximal biceps tendon.  At the time of his last office visit, I did have him start [facility]'s motion protocol, and we also did an intra-articular injection in the he was in the office.  L 4th toe with swelling and pain after a toe fracture 3 months ago.  - still with a lot of swelling.    Review of Systems is noted in the HPI, as appropriate  Objective:   BP (!) 140/80   Pulse 97   Temp 98.6 F (37 C) (Oral)   Ht '5\' 7"'$  (1.702 m)   Wt 146 lb (66.2 kg)   SpO2 99%   BMI 22.87 kg/m   GEN: No acute distress; alert,appropriate. PULM: Breathing comfortably in no respiratory distress PSYCH: Normally interactive.   Laboratory and Imaging Data:  Limited musculoskeletal ultrasound was performed and interpreted by Frederico Hamman T. Wayden Schwertner  Location: R biceps tendon Device: GE Versana Indication: pain Findings:  The previously visualized split tear of the proximal biceps tendon seen on MRI is not seen on ultrasound.  There is no significant fluid surrounding the biceps tendon.    Assessment and Plan:   ***

## 2022-05-31 ENCOUNTER — Encounter: Payer: Self-pay | Admitting: Family Medicine

## 2022-05-31 ENCOUNTER — Ambulatory Visit (INDEPENDENT_AMBULATORY_CARE_PROVIDER_SITE_OTHER): Payer: BC Managed Care – PPO | Admitting: Family Medicine

## 2022-05-31 VITALS — BP 112/80 | HR 81 | Temp 98.5°F | Ht 67.0 in | Wt 149.4 lb

## 2022-05-31 DIAGNOSIS — M545 Low back pain, unspecified: Secondary | ICD-10-CM

## 2022-05-31 DIAGNOSIS — R109 Unspecified abdominal pain: Secondary | ICD-10-CM | POA: Diagnosis not present

## 2022-05-31 LAB — POC URINALSYSI DIPSTICK (AUTOMATED)
Bilirubin, UA: NEGATIVE
Blood, UA: NEGATIVE
Glucose, UA: NEGATIVE
Ketones, UA: NEGATIVE
Leukocytes, UA: NEGATIVE
Nitrite, UA: NEGATIVE
Protein, UA: NEGATIVE
Spec Grav, UA: 1.015 (ref 1.010–1.025)
Urobilinogen, UA: 0.2 E.U./dL
pH, UA: 6 (ref 5.0–8.0)

## 2022-05-31 MED ORDER — TIZANIDINE HCL 4 MG PO TABS: 4.0000 mg | ORAL_TABLET | Freq: Every evening | ORAL | 2 refills | Status: DC | PRN

## 2022-05-31 MED ORDER — DICLOFENAC SODIUM 75 MG PO TBEC: 75.0000 mg | DELAYED_RELEASE_TABLET | Freq: Two times a day (BID) | ORAL | 1 refills | Status: DC

## 2022-05-31 NOTE — Progress Notes (Signed)
[name] T. Yanett Conkright, MD, [place] at [facility] [place], [address]  Phone: [phone]  FAX: [phone]  [name] - 52 y.o. male  MRN [id number]  Date of Birth: 1970-11-03  Date: 05/31/2022  PCP: Ria Bush, MD  Referral: Ria Bush, MD  Chief Complaint  Patient presents with   Flank Pain    Left   Subjective:   [name] is a 52 y.o. very pleasant male patient with Body mass index is 23.4 kg/m. who presents with the following:  He is a well-known patient, and he presents with some left flank pain more so than back pain, but he does have back pain, as well.  Has not picked up anything heavy.  Monday morning got up, and was going about his morning and had a aching in the l lower back.  It is on the left side in the lower back, as well as the flank.  As he went about the day, it got worse and worse.  Was limping.  Was trying to do his work, going up and down a ladder.   Tried a bag of epsom salts.  Yesterday morning, felt pretty good, and then he went to work, nothing strenuous.  Went home at two in the afternoon.  Went to the small of his back and on the l lateral flank.  Like a cold sensation with water.    This morning the same thing, and today he did nothing.  Dull ache that it is there.  He denies any blood in his urine.  No dysuria.  Review of Systems is noted in the HPI, as appropriate  Objective:   BP 112/80   Pulse 81   Temp 98.5 F (36.9 C) (Temporal)   Ht 5' 7"$  (1.702 m)   Wt 149 lb 6 oz (67.8 kg)   SpO2 96%   BMI 23.40 kg/m    Range of motion at  the waist: Flexion, extension, lateral bending and rotation: Mild restriction in forward flexion, and the remainder of the movement exam is normal  No echymosis or edema Rises to examination table with mild difficulty Gait: minimally antalgic  Inspection/Deformity: N Paraspinus Tenderness: L4-S1 on the left.  There is also  tenderness deep in the flank on the left side  B Ankle Dorsiflexion (L5,4): 5/5 B Great Toe Dorsiflexion (L5,4): 5/5 Heel Walk (L5): WNL Toe Walk (S1): WNL Rise/Squat (L4): WNL, mild pain  SENSORY B Medial Foot (L4): WNL B Dorsum (L5): WNL B Lateral (S1): WNL Light Touch: WNL  B SLR, seated: neg B SLR, supine: neg B FABER: neg B Reverse FABER: neg B Greater Troch: NT B Log Roll: neg B Sciatic Notch: NT   Laboratory and Imaging Data: Results for orders placed or performed in visit on 05/31/22  POCT Urinalysis Dipstick (Automated)  Result Value Ref Range   Color, UA Yellow    Clarity, UA Clear    Glucose, UA Negative Negative   Bilirubin, UA Negative    Ketones, UA Negative    Spec Grav, UA 1.015 1.010 - 1.025   Blood, UA Negative    pH, UA 6.0 5.0 - 8.0   Protein, UA Negative Negative   Urobilinogen, UA 0.2 0.2 or 1.0 E.U./dL   Nitrite, UA Negative    Leukocytes, UA Negative Negative     Assessment and Plan:     ICD-10-CM   1. Left flank pain  R10.9 POCT  Urinalysis Dipstick (Automated)    2. Acute left-sided low back pain without sciatica  M54.50      No blood in urine, very unlikely to be kidney stone.  Acute muscular strain on the left flank and back.  I am good to have him to continue doing some basic motion, heat as needed, and I will also start him on some scheduled high-dose NSAIDs as well as some Zanaflex as needed.  Medication Management during today's office visit: Meds ordered this encounter  Medications   diclofenac (VOLTAREN) 75 MG EC tablet    Sig: Take 1 tablet (75 mg total) by mouth 2 (two) times daily.    Dispense:  60 tablet    Refill:  1   tiZANidine (ZANAFLEX) 4 MG tablet    Sig: Take 1 tablet (4 mg total) by mouth at bedtime as needed for muscle spasms.    Dispense:  30 tablet    Refill:  2   Medications Discontinued During This Encounter  Medication Reason   predniSONE (DELTASONE) 20 MG tablet Completed Course   diclofenac  (VOLTAREN) 75 MG EC tablet Completed Course    Orders placed today for conditions managed today: Orders Placed This Encounter  Procedures   POCT Urinalysis Dipstick (Automated)    Disposition: No follow-ups on file.  [employer] One speech-to-text software was used for transcription in this dictation.  Possible transcriptional errors can occur using Editor, commissioning.   Signed,  Maud Deed. [place] Kathol, MD   Outpatient Encounter Medications as of 05/31/2022  Medication Sig   Aspirin-Salicylamide-Caffeine (ARTHRITIS STRENGTH BC POWDER PO) Take 1 packet by mouth 2 (two) times daily as needed (for pain).   diclofenac (VOLTAREN) 75 MG EC tablet Take 1 tablet (75 mg total) by mouth 2 (two) times daily.   EPINEPHrine 0.3 mg/0.3 mL IJ SOAJ injection Inject 0.3 mg into the muscle daily as needed.   tiZANidine (ZANAFLEX) 4 MG tablet Take 1 tablet (4 mg total) by mouth at bedtime as needed for muscle spasms.   [DISCONTINUED] diclofenac (VOLTAREN) 75 MG EC tablet TAKE 1 TABLET(75 MG) BY MOUTH TWICE DAILY WITH FOOD AS NEEDED FOR MODERATE PAIN   [DISCONTINUED] predniSONE (DELTASONE) 20 MG tablet 2 tabs po daily for 5 days, then 1 tab po daily for 5 days   No facility-administered encounter medications on file as of 05/31/2022.

## 2022-09-18 ENCOUNTER — Other Ambulatory Visit: Payer: Self-pay | Admitting: Acute Care

## 2022-09-18 DIAGNOSIS — Z87891 Personal history of nicotine dependence: Secondary | ICD-10-CM

## 2022-09-18 DIAGNOSIS — F17210 Nicotine dependence, cigarettes, uncomplicated: Secondary | ICD-10-CM

## 2022-09-18 DIAGNOSIS — Z122 Encounter for screening for malignant neoplasm of respiratory organs: Secondary | ICD-10-CM

## 2022-10-06 ENCOUNTER — Ambulatory Visit
Admission: RE | Admit: 2022-10-06 | Discharge: 2022-10-06 | Disposition: A | Discharge status: Home / Self Care | Payer: BC Managed Care – PPO | Source: Ambulatory Visit | Attending: Acute Care | Admitting: Acute Care

## 2022-10-06 DIAGNOSIS — Z122 Encounter for screening for malignant neoplasm of respiratory organs: Secondary | ICD-10-CM

## 2022-10-06 DIAGNOSIS — Z87891 Personal history of nicotine dependence: Secondary | ICD-10-CM

## 2022-10-06 DIAGNOSIS — F17210 Nicotine dependence, cigarettes, uncomplicated: Secondary | ICD-10-CM

## 2022-10-12 ENCOUNTER — Other Ambulatory Visit: Payer: Self-pay

## 2022-10-12 DIAGNOSIS — Z87891 Personal history of nicotine dependence: Secondary | ICD-10-CM

## 2022-10-12 DIAGNOSIS — Z122 Encounter for screening for malignant neoplasm of respiratory organs: Secondary | ICD-10-CM

## 2022-10-12 DIAGNOSIS — F17210 Nicotine dependence, cigarettes, uncomplicated: Secondary | ICD-10-CM

## 2022-12-28 ENCOUNTER — Encounter (HOSPITAL_BASED_OUTPATIENT_CLINIC_OR_DEPARTMENT_OTHER): Payer: Self-pay | Admitting: Pediatrics

## 2022-12-28 ENCOUNTER — Other Ambulatory Visit: Payer: Self-pay

## 2022-12-28 ENCOUNTER — Emergency Department (HOSPITAL_BASED_OUTPATIENT_CLINIC_OR_DEPARTMENT_OTHER)
Admission: EM | Admit: 2022-12-28 | Discharge: 2022-12-28 | Disposition: A | Discharge status: Home / Self Care | Payer: BC Managed Care – PPO | Attending: Emergency Medicine | Admitting: Emergency Medicine

## 2022-12-28 ENCOUNTER — Emergency Department (HOSPITAL_BASED_OUTPATIENT_CLINIC_OR_DEPARTMENT_OTHER): Payer: BC Managed Care – PPO

## 2022-12-28 DIAGNOSIS — R109 Unspecified abdominal pain: Secondary | ICD-10-CM | POA: Diagnosis not present

## 2022-12-28 DIAGNOSIS — I10 Essential (primary) hypertension: Secondary | ICD-10-CM | POA: Insufficient documentation

## 2022-12-28 DIAGNOSIS — R1031 Right lower quadrant pain: Secondary | ICD-10-CM | POA: Insufficient documentation

## 2022-12-28 DIAGNOSIS — R100 Acute abdomen: Secondary | ICD-10-CM | POA: Diagnosis not present

## 2022-12-28 LAB — COMPREHENSIVE METABOLIC PANEL
ALT: 36 U/L (ref 0–44)
AST: 38 U/L (ref 15–41)
Albumin: 4.5 g/dL (ref 3.5–5.0)
Alkaline Phosphatase: 64 U/L (ref 38–126)
Anion gap: 8 (ref 5–15)
BUN: 10 mg/dL (ref 6–20)
CO2: 29 mmol/L (ref 22–32)
Calcium: 9.2 mg/dL (ref 8.9–10.3)
Chloride: 101 mmol/L (ref 98–111)
Creatinine, Ser: 0.59 mg/dL — ABNORMAL LOW (ref 0.61–1.24)
GFR, Estimated: >60 mL/min (ref >60)
Glucose, Bld: 93 mg/dL (ref 70–99)
Potassium: 4.4 mmol/L (ref 3.5–5.1)
Sodium: 138 mmol/L (ref 135–145)
Total Bilirubin: 0.8 mg/dL (ref 0.3–1.2)
Total Protein: 7.5 g/dL (ref 6.5–8.1)

## 2022-12-28 LAB — CBC
HCT: 45.6 % (ref 39.0–52.0)
Hemoglobin: 15.9 g/dL (ref 13.0–17.0)
MCH: 33.3 pg (ref 26.0–34.0)
MCHC: 34.9 g/dL (ref 30.0–36.0)
MCV: 95.4 fL (ref 80.0–100.0)
Platelets: 314 10*3/uL (ref 150–400)
RBC: 4.78 MIL/uL (ref 4.22–5.81)
RDW: 14 % (ref 11.5–15.5)
WBC: 9.1 10*3/uL (ref 4.0–10.5)
nRBC: 0 % (ref 0.0–0.2)

## 2022-12-28 LAB — URINALYSIS, ROUTINE W REFLEX MICROSCOPIC
Bilirubin Urine: NEGATIVE
Glucose, UA: NEGATIVE mg/dL
Hgb urine dipstick: NEGATIVE
Ketones, ur: NEGATIVE mg/dL
Leukocytes,Ua: NEGATIVE
Nitrite: NEGATIVE
Specific Gravity, Urine: 1.018 (ref 1.005–1.030)
pH: 7 (ref 5.0–8.0)

## 2022-12-28 LAB — LIPASE, BLOOD: Lipase: 32 U/L (ref 11–51)

## 2022-12-28 MED ORDER — KETOROLAC TROMETHAMINE 15 MG/ML IJ SOLN
15.0000 mg | Freq: Once | INTRAMUSCULAR | Status: AC
  Administered 2022-12-28: 15 mg via INTRAVENOUS
  Filled 2022-12-28: qty 1

## 2022-12-28 MED ORDER — IOHEXOL 300 MG/ML  SOLN
80.0000 mL | Freq: Once | INTRAMUSCULAR | Status: AC | PRN
  Administered 2022-12-28: 80 mL via INTRAVENOUS

## 2022-12-28 MED ORDER — SODIUM CHLORIDE 0.9 % IV BOLUS
500.0000 mL | Freq: Once | INTRAVENOUS | Status: AC
  Administered 2022-12-28: 500 mL via INTRAVENOUS

## 2022-12-28 NOTE — Discharge Instructions (Signed)
Your CAT scan was reassuring, please follow-up with your primary care doctor, for further evaluation, you can take Tylenol or ibuprofen for pain control, and return if your symptoms are worsening.

## 2022-12-28 NOTE — ED Notes (Signed)
Patient transported to CT 

## 2022-12-28 NOTE — ED Notes (Signed)
Ambulatory to restroom

## 2022-12-28 NOTE — ED Notes (Signed)
Reviewed AVS/discharge instruction with patient. Time allotted for and all questions answered. Patient is agreeable for d/c and escorted to ed exit by staff.  

## 2022-12-28 NOTE — ED Provider Notes (Signed)
[facility] EMERGENCY DEPARTMENT AT [facility] Provider Note   CSN: [id number] Arrival date & time: 12/28/22  1149     History  Chief Complaint  Patient presents with   Abdominal Pain    [name] is a 52 y.o. male, history of hypertension, alcohol abuse, pneumonia, who presents to the ED secondary to right flank pain starting this a.m.  He states that this this this morning, he started having some right flank pain, that was kind of pressurized in nature, and worse with movement.  He notes that it is fairly constant, but can ebb and flow in pain.  States it feels like there is pressure on him.  Notes that he thought it was that he was constipated, but had 2 bowel movements without relief.  He denies any vomiting, fevers, or urinary symptoms including increased frequency, urgency, and dysuria.  He went to urgent care, and was told that he needs some imaging, so came to the ER.  States he has not had any abdominal surgeries, before, denies any shortness of breath or chest pain.  Pain came on suddenly  History of 2 hernia repairs.     Home Medications Prior to Admission medications   Medication Sig Start Date End Date Taking? Authorizing Provider  Aspirin-Salicylamide-Caffeine (ARTHRITIS STRENGTH BC POWDER PO) Take 1 packet by mouth 2 (two) times daily as needed (for pain).    [provider]  diclofenac (VOLTAREN) 75 MG EC tablet Take 1 tablet (75 mg total) by mouth 2 (two) times daily. 05/31/22   Copland, Karleen Hampshire, MD  EPINEPHrine 0.3 mg/0.3 mL IJ SOAJ injection Inject 0.3 mg into the muscle daily as needed. 02/22/21   Rice, Jamesetta Orleans, MD  tiZANidine (ZANAFLEX) 4 MG tablet Take 1 tablet (4 mg total) by mouth at bedtime as needed for muscle spasms. 05/31/22   Copland, Karleen Hampshire, MD      Allergies    Bee venom, Celexa [citalopram hydrobromide], Oxycodone-acetaminophen, and Pristiq [desvenlafaxine succinate er]    Review of Systems   Review of Systems  Gastrointestinal:   Positive for abdominal pain and nausea. Negative for vomiting.    Physical Exam Updated Vital Signs BP (!) 143/90   Pulse 72   Temp 98.8 F (37.1 C)   Resp 16   Ht 5\' 9"  (1.753 m)   Wt 63.5 kg   SpO2 100%   BMI 20.67 kg/m  Physical Exam Vitals and nursing note reviewed.  Constitutional:      General: He is not in acute distress.    Appearance: He is well-developed.  HENT:     Head: Normocephalic and atraumatic.  Eyes:     Conjunctiva/sclera: Conjunctivae normal.  Cardiovascular:     Rate and Rhythm: Normal rate and regular rhythm.     Heart sounds: No murmur heard. Pulmonary:     Effort: Pulmonary effort is normal. No respiratory distress.     Breath sounds: Normal breath sounds.  Abdominal:     Palpations: Abdomen is soft.     Tenderness: There is abdominal tenderness in the right lower quadrant. There is right CVA tenderness. There is no guarding or rebound.  Musculoskeletal:        General: No swelling.     Cervical back: Neck supple.     Comments: Pain worse with truncal rotation.  Skin:    General: Skin is warm and dry.     Capillary Refill: Capillary refill takes less than 2 seconds.  Neurological:  Mental Status: He is alert.  Psychiatric:        Mood and Affect: Mood normal.     ED Results / Procedures / Treatments   Labs (all labs ordered are listed, but only abnormal results are displayed) Labs Reviewed  COMPREHENSIVE METABOLIC PANEL - Abnormal; Notable for the following components:      Result Value   Creatinine, Ser 0.59 (*)    All other components within normal limits  URINALYSIS, ROUTINE W REFLEX MICROSCOPIC - Abnormal; Notable for the following components:   Protein, ur TRACE (*)    All other components within normal limits  LIPASE, BLOOD  CBC    EKG None  Radiology CT ABDOMEN PELVIS W CONTRAST  Result Date: 12/28/2022 CLINICAL DATA:  Right-sided abdominal and flank pain beginning this morning. Positive right CVA tenderness,  Murphy's sign, and McBurney's point tenderness. EXAM: CT ABDOMEN AND PELVIS WITH CONTRAST TECHNIQUE: Multidetector CT imaging of the abdomen and pelvis was performed using the standard protocol following bolus administration of intravenous contrast. RADIATION DOSE REDUCTION: This exam was performed according to the departmental dose-optimization program which includes automated exposure control, adjustment of the mA and/or kV according to patient size and/or use of iterative reconstruction technique. CONTRAST:  80mL OMNIPAQUE IOHEXOL 300 MG/ML  SOLN COMPARISON:  03/03/2008 FINDINGS: Lower Chest: No acute findings. Hepatobiliary: No suspicious hepatic masses identified. Gallbladder is unremarkable. No evidence of biliary ductal dilatation. Pancreas:  No mass or inflammatory changes. Spleen: Within normal limits in size and appearance. Adrenals/Urinary Tract: No suspicious masses identified. No evidence of ureteral calculi or hydronephrosis. Stomach/Bowel: Wall thickening visualized distal esophagus, suspicious for esophagitis. No evidence of obstruction, inflammatory process or abnormal fluid collections. Normal appendix visualized. Vascular/Lymphatic: No pathologically enlarged lymph nodes. No acute vascular findings. Reproductive:  No mass or other significant abnormality. Other:  None. Musculoskeletal:  No suspicious bone lesions identified. IMPRESSION: Wall thickening of visualized distal esophagus, suspicious for esophagitis. No other acute findings or significant abnormality identified. Electronically Signed   By: Danae Orleans M.D.   On: 12/28/2022 15:11    Procedures Procedures    Medications Ordered in ED Medications  ketorolac (TORADOL) 15 MG/ML injection 15 mg (15 mg Intravenous Given 12/28/22 1242)  sodium chloride 0.9 % bolus 500 mL (0 mLs Intravenous Stopped 12/28/22 1326)  iohexol (OMNIPAQUE) 300 MG/ML solution 80 mL (80 mLs Intravenous Contrast Given 12/28/22 1330)    ED Course/ Medical Decision  Making/ A&P                                 Medical Decision Making Patient is a 52 year old male, here for right lower quadrant pain, radiating to the back, has been going on for the last day.  He has no nausea, vomiting, diarrhea.  Is overall well-appearing.  Pain is worse with movement.  We will obtain a CT abdomen pelvis, to rule out any kind of possible stone versus intra-abdominal pathology  Amount and/or Complexity of Data Reviewed Labs: ordered.    Details: No leukocytosis, no transaminitis, urinalysis clear Radiology: ordered.    Details: CT abd pelvis, shows no acute findings, possible findings of esophagitis Discussion of management or test interpretation with external provider(s): Discussed with patient, he is feeling better after Toradol, he feels like his pain is improved, we discussed his negative CT abdomen pelvis, as well as reassuring lab work.  Is possible muscle strain, as it is from his right lower  quadrant, radiating to his back, so may be a muscle strain secondary to overuse.  He should follow-up with his PCP, we did also discuss that he has esophagitis, and he should reduce his acid intake, as well as hot spicy foods.  Discussed return precautions and he voiced understanding.  Discharged home  Risk Prescription drug management.    Final Clinical Impression(s) / ED Diagnoses Final diagnoses:  Right lower quadrant pain  Right flank pain    Rx / DC Orders ED Discharge Orders     None         Casha Estupinan L, PA 12/28/22 1551    Franne Forts, DO 01/02/23 [id number]

## 2022-12-28 NOTE — ED Triage Notes (Signed)
Was sent from UC due to right sided flank abdominal pain upon waking this morning that worsen throughout the day. Patient reports no relief from BM today. Per UC notes, + CVA tenderness and + murphy's sign & mcburney's.

## 2023-01-19 ENCOUNTER — Telehealth: Payer: Self-pay

## 2023-01-19 NOTE — Telephone Encounter (Signed)
Transition Care Management Unsuccessful Follow-up Telephone Call  Date of discharge and from where:  12/28/2022 [facility]  Attempts:  1st Attempt  Reason for unsuccessful TCM follow-up call:  No answer/busy  Brynlyn Dade [facility]  [facility], [facility] Guide Direct Dial: [phone]  Website: [email or web address]

## 2023-01-22 ENCOUNTER — Telehealth: Payer: Self-pay

## 2023-01-22 NOTE — Telephone Encounter (Signed)
Transition Care Management Follow-up Telephone Call Date of discharge and from where: 12/28/2022 [facility] How have you been since you were released from the hospital? Patient stated he is feeling much better. Any questions or concerns? No  Items Reviewed: Did the pt receive and understand the discharge instructions provided? Yes  Medications obtained and verified?  No medication prescribed. Other? No  Any new allergies since your discharge? No  Dietary orders reviewed? Yes Do you have support at home? Yes   Follow up appointments reviewed:  PCP Hospital f/u appt confirmed?  Patient stated he is feeling better and did not need to follow-up.  Scheduled to see  on  @ . Specialist Hospital f/u appt confirmed? No  Scheduled to see  on  @ . Are transportation arrangements needed? No  If their condition worsens, is the pt aware to call PCP or go to the Emergency Dept.? Yes Was the patient provided with contact information for the PCP's office or ED? Yes Was to pt encouraged to call back with questions or concerns? Yes  Sheridan Hew [facility]  [facility], [facility] Guide Direct Dial: [phone]  Website: [email or web address]

## 2023-02-28 ENCOUNTER — Ambulatory Visit: Payer: BC Managed Care – PPO | Admitting: Internal Medicine

## 2023-02-28 ENCOUNTER — Encounter: Payer: Self-pay | Admitting: Internal Medicine

## 2023-02-28 VITALS — BP 116/76 | HR 93 | Temp 98.7°F | Ht 67.0 in | Wt 143.0 lb

## 2023-02-28 DIAGNOSIS — L309 Dermatitis, unspecified: Secondary | ICD-10-CM

## 2023-02-28 MED ORDER — TRIAMCINOLONE ACETONIDE 0.1 % EX CREA: 1.0000 | TOPICAL_CREAM | Freq: Two times a day (BID) | CUTANEOUS | 1 refills | Status: DC | PRN
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
## 2023-02-28 NOTE — Assessment & Plan Note (Addendum)
Typical distribution but presence on penis makes me also consider lichen planus Also on ears Reassured --not herpetic Unusual to have this come out now---will refer for derm evaluation Trial triamcinolone cream Moisturizing  soap and moisturizers after showering Try cetirizine or loratadine for the itching

## 2023-03-05 ENCOUNTER — Ambulatory Visit: Payer: BC Managed Care – PPO | Admitting: Family Medicine

## 2023-03-28 ENCOUNTER — Ambulatory Visit (INDEPENDENT_AMBULATORY_CARE_PROVIDER_SITE_OTHER): Payer: BC Managed Care – PPO | Admitting: Family Medicine

## 2023-03-28 ENCOUNTER — Encounter: Payer: Self-pay | Admitting: Family Medicine

## 2023-03-28 VITALS — BP 136/84 | HR 80 | Temp 97.7°F | Ht 67.0 in | Wt 145.1 lb

## 2023-03-28 DIAGNOSIS — L309 Dermatitis, unspecified: Secondary | ICD-10-CM | POA: Diagnosis not present

## 2023-03-28 DIAGNOSIS — R21 Rash and other nonspecific skin eruption: Secondary | ICD-10-CM | POA: Insufficient documentation

## 2023-03-28 LAB — CBC WITH DIFFERENTIAL/PLATELET
Basophils Absolute: 0.1 10*3/uL (ref 0.0–0.1)
Basophils Relative: 1.2 % (ref 0.0–3.0)
Eosinophils Absolute: 0.2 10*3/uL (ref 0.0–0.7)
Eosinophils Relative: 2.1 % (ref 0.0–5.0)
HCT: 47.9 % (ref 39.0–52.0)
Hemoglobin: 16.1 g/dL (ref 13.0–17.0)
Lymphocytes Relative: 22.9 % (ref 12.0–46.0)
Lymphs Abs: 2.1 10*3/uL (ref 0.7–4.0)
MCHC: 33.6 g/dL (ref 30.0–36.0)
MCV: 100 fL (ref 78.0–100.0)
Monocytes Absolute: 0.7 10*3/uL (ref 0.1–1.0)
Monocytes Relative: 7.8 % (ref 3.0–12.0)
Neutro Abs: 6.1 10*3/uL (ref 1.4–7.7)
Neutrophils Relative %: 66 % (ref 43.0–77.0)
Platelets: 349 10*3/uL (ref 150.0–400.0)
RBC: 4.79 Mil/uL (ref 4.22–5.81)
RDW: 13.5 % (ref 11.5–15.5)
WBC: 9.2 10*3/uL (ref 4.0–10.5)

## 2023-03-28 LAB — SEDIMENTATION RATE: Sed Rate: 14 mm/h (ref 0–20)

## 2023-03-28 LAB — COMPREHENSIVE METABOLIC PANEL
ALT: 25 U/L (ref 0–53)
AST: 28 U/L (ref 0–37)
Albumin: 4.9 g/dL (ref 3.5–5.2)
Alkaline Phosphatase: 66 U/L (ref 39–117)
BUN: 17 mg/dL (ref 6–23)
CO2: 29 meq/L (ref 19–32)
Calcium: 10.2 mg/dL (ref 8.4–10.5)
Chloride: 103 meq/L (ref 96–112)
Creatinine, Ser: 0.66 mg/dL (ref 0.40–1.50)
GFR: 106.97 mL/min (ref >60.00)
Glucose, Bld: 84 mg/dL (ref 70–99)
Potassium: 4.2 meq/L (ref 3.5–5.1)
Sodium: 140 meq/L (ref 135–145)
Total Bilirubin: 0.6 mg/dL (ref 0.2–1.2)
Total Protein: 7.7 g/dL (ref 6.0–8.3)

## 2023-03-28 LAB — TSH: TSH: 1.04 u[IU]/mL (ref 0.35–5.50)

## 2023-03-28 MED ORDER — HYDROXYZINE HCL 25 MG PO TABS: 25.0000 mg | ORAL_TABLET | Freq: Two times a day (BID) | ORAL | 0 refills | Status: DC | PRN

## 2023-03-28 MED ORDER — PERMETHRIN 5 % EX CREA: 1.0000 | TOPICAL_CREAM | Freq: Once | CUTANEOUS | 0 refills | Status: AC

## 2023-03-28 MED ORDER — PREDNISONE 20 MG PO TABS: ORAL_TABLET | ORAL | 0 refills | Status: DC

## 2023-03-28 NOTE — Patient Instructions (Addendum)
Labs today  Take permethrin treatment - apply to neck down overnight, then wash off the next morning.  Take prednisone taper sent to pharmacy - take in am.  Take hydroxyzine anti-itch medicine as needed, caution with sedation.  We will check on dermatology evaluation.

## 2023-03-28 NOTE — Progress Notes (Signed)
Ph: [phone] Fax: [phone]   Patient ID: [name], male    DOB: Sep 07, 1970, 52 y.o.   MRN: [id number]  This visit was conducted in person.  BP 136/84   Pulse 80   Temp 97.7 F (36.5 C) (Oral)   Ht 5\' 7"  (1.702 m)   Wt 145 lb 2 oz (65.8 kg)   SpO2 94%   BMI 22.73 kg/m    CC: rash  Subjective:   HPI: [name] is a 52 y.o. male presenting on 03/28/2023 for Rash (C/o rash allover. Started about 1 mo ago- worsening. Seen for previously, Kenalog crm not helping. )   Saw Dr Alphonsus Sias last month with diffuse rash throughout body including penis - ?eczema vs lichen planus, referred to dermatology - never got contacted for this. Rx triamcinolone cream without significant benefit. Rash was itchy.   Ongoing rash, seems to be spreading.  Has never had eczema.   GF recently diagnosed with herpes.   He has been under tremendous work stress - vivid bad dreams about job.  No appetite.   No new lotions, detergents, soaps or shampoos.  No new medicines, vitamins/supplements. He changed to [facility] mild soap.  No new beds, no similar rash at home.  No fevers/chills, joint pains, swollen glands.  He does note intermittent HAs, largely unchanged Possible worsening night sweats.  No unexpected weight loss  No spots on palms or soles.      Relevant past medical, surgical, family and social history reviewed and updated as indicated. Interim medical history since our last visit reviewed. Allergies and medications reviewed and updated. Outpatient Medications Prior to Visit  Medication Sig Dispense Refill   Aspirin-Salicylamide-Caffeine (ARTHRITIS STRENGTH BC POWDER PO) Take 1 packet by mouth 2 (two) times daily as needed (for pain).     EPINEPHrine 0.3 mg/0.3 mL IJ SOAJ injection Inject 0.3 mg into the muscle daily as needed. 2 each 0   triamcinolone cream (KENALOG) 0.1 % Apply 1 Application topically 2 (two) times daily as needed. 45 g 1   No facility-administered medications  prior to visit.     Per HPI unless specifically indicated in ROS section below Review of Systems  Objective:  BP 136/84   Pulse 80   Temp 97.7 F (36.5 C) (Oral)   Ht 5\' 7"  (1.702 m)   Wt 145 lb 2 oz (65.8 kg)   SpO2 94%   BMI 22.73 kg/m   Wt Readings from Last 3 Encounters:  03/28/23 145 lb 2 oz (65.8 kg)  02/28/23 143 lb (64.9 kg)  12/28/22 140 lb (63.5 kg)      Physical Exam Vitals and nursing note reviewed.  Constitutional:      Appearance: Normal appearance. He is not ill-appearing.  Musculoskeletal:     Right lower leg: No edema.     Left lower leg: No edema.  Skin:    General: Skin is warm and dry.     Findings: Erythema and rash present.     Comments:  Diffuse rash throughout body  Intensely pruritic small papules throughout extremities and trunk with excoriations, some open erosions to extremities, and some with overlying scale Few erythematous macules to tip of penis Pruritic papules to scrotum  Eczematous skin thickening and lichenification to left popliteal area and to bilateral neck   Neurological:     Mental Status: He is alert.  Psychiatric:        Mood and Affect: Mood normal.  Behavior: Behavior normal.   See pictures below     R neck   R forearm    L forearm   L popliteal region    Results for orders placed or performed during the hospital encounter of 12/28/22  Lipase, blood  Result Value Ref Range   Lipase 32 11 - 51 U/L  Comprehensive metabolic panel  Result Value Ref Range   Sodium 138 135 - 145 mmol/L   Potassium 4.4 3.5 - 5.1 mmol/L   Chloride 101 98 - 111 mmol/L   CO2 29 22 - 32 mmol/L   Glucose, Bld 93 70 - 99 mg/dL   BUN 10 6 - 20 mg/dL   Creatinine, Ser 4.01 (L) 0.61 - 1.24 mg/dL   Calcium 9.2 8.9 - 02.7 mg/dL   Total Protein 7.5 6.5 - 8.1 g/dL   Albumin 4.5 3.5 - 5.0 g/dL   AST 38 15 - 41 U/L   ALT 36 0 - 44 U/L   Alkaline Phosphatase 64 38 - 126 U/L   Total Bilirubin 0.8 0.3 - 1.2 mg/dL   GFR,  Estimated >25 >36 mL/min   Anion gap 8 5 - 15  CBC  Result Value Ref Range   WBC 9.1 4.0 - 10.5 K/uL   RBC 4.78 4.22 - 5.81 MIL/uL   Hemoglobin 15.9 13.0 - 17.0 g/dL   HCT 64.4 03.4 - 74.2 %   MCV 95.4 80.0 - 100.0 fL   MCH 33.3 26.0 - 34.0 pg   MCHC 34.9 30.0 - 36.0 g/dL   RDW 59.5 63.8 - 75.6 %   Platelets 314 150 - 400 K/uL   nRBC 0.0 0.0 - 0.2 %  Urinalysis, Routine w reflex microscopic -Urine, Clean Catch  Result Value Ref Range   Color, Urine YELLOW YELLOW   APPearance CLEAR CLEAR   Specific Gravity, Urine 1.018 1.005 - 1.030   pH 7.0 5.0 - 8.0   Glucose, UA NEGATIVE NEGATIVE mg/dL   Hgb urine dipstick NEGATIVE NEGATIVE   Bilirubin Urine NEGATIVE NEGATIVE   Ketones, ur NEGATIVE NEGATIVE mg/dL   Protein, ur TRACE (A) NEGATIVE mg/dL   Nitrite NEGATIVE NEGATIVE   Leukocytes,Ua NEGATIVE NEGATIVE    Assessment & Plan:   Problem List Items Addressed This Visit     Eczematous dermatitis   Skin rash - Primary    Present for 2 months, thought eczematous dermatitis on eval last month, not improved with topical triamcinolone steroid.  Ongoing, worsening rash of unclear cause.  Not quite consistent with lichen planus or fully with eczema today.  Will Rx oral prednisone taper, hydroxyzine PRN itch, treat with permethrin to cover possible scabies, will check into dermatology referral placed last month.  Update if not improved with this treatment.  Check labwork given duration of rash.       Relevant Orders   Comprehensive metabolic panel   TSH   CBC with Differential/Platelet   Sedimentation rate     Meds ordered this encounter  Medications   predniSONE (DELTASONE) 20 MG tablet    Sig: Take two tablets daily for 4 days followed by one tablet daily for 4 days    Dispense:  12 tablet    Refill:  0   hydrOXYzine (ATARAX) 25 MG tablet    Sig: Take 1 tablet (25 mg total) by mouth 2 (two) times daily as needed for itching (sedation precautions).    Dispense:  40 tablet     Refill:  0   permethrin (ELIMITE)  5 % cream    Sig: Apply 1 Application topically once for 1 dose. Apply overnight, wash off the next morning    Dispense:  60 g    Refill:  0    Orders Placed This Encounter  Procedures   Comprehensive metabolic panel   TSH   CBC with Differential/Platelet   Sedimentation rate    Patient Instructions  Labs today  Take permethrin treatment - apply to neck down overnight, then wash off the next morning.  Take prednisone taper sent to pharmacy - take in am.  Take hydroxyzine anti-itch medicine as needed, caution with sedation.  We will check on dermatology evaluation.    Follow up plan: No follow-ups on file.  Eustaquio Boyden, MD

## 2023-03-28 NOTE — Assessment & Plan Note (Signed)
Present for 2 months, thought eczematous dermatitis on eval last month, not improved with topical triamcinolone steroid.  Ongoing, worsening rash of unclear cause.  Not quite consistent with lichen planus or fully with eczema today.  Will Rx oral prednisone taper, hydroxyzine PRN itch, treat with permethrin to cover possible scabies, will check into dermatology referral placed last month.  Update if not improved with this treatment.  Check labwork given duration of rash.

## 2023-04-17 ENCOUNTER — Ambulatory Visit (INDEPENDENT_AMBULATORY_CARE_PROVIDER_SITE_OTHER): Payer: BC Managed Care – PPO | Admitting: Family Medicine

## 2023-04-17 ENCOUNTER — Encounter: Payer: Self-pay | Admitting: Family Medicine

## 2023-04-17 VITALS — BP 104/60 | HR 90 | Temp 98.5°F | Ht 67.0 in | Wt 143.4 lb

## 2023-04-17 DIAGNOSIS — L309 Dermatitis, unspecified: Secondary | ICD-10-CM

## 2023-04-17 MED ORDER — FLUOCINONIDE 0.1 % EX CREA: TOPICAL_CREAM | CUTANEOUS | 0 refills | Status: DC

## 2023-04-17 MED ORDER — PREDNISONE 20 MG PO TABS: ORAL_TABLET | ORAL | 0 refills | Status: DC
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
## 2023-04-17 NOTE — Patient Instructions (Signed)
Restart prednisone and use fluocinonide if needed.  Update [place] in a few days.  Take care.  Glad to see you.

## 2023-04-20 NOTE — Assessment & Plan Note (Signed)
I suspect this is an eczema variant where he had appropriate treatment with prednisone before but the dose may not have been sufficient.  Discussed options.  This does not appear infectious.  Okay for outpatient follow-up.  Start prednisone at higher dose with routine steroid cautions discussed with patient.  Use fluocinonide as needed.  Refer to dermatology.  He agrees to plan.

## 2023-04-23 ENCOUNTER — Emergency Department (HOSPITAL_COMMUNITY): Payer: BC Managed Care – PPO

## 2023-04-23 ENCOUNTER — Observation Stay (HOSPITAL_COMMUNITY)
Admission: EM | Admit: 2023-04-23 | Discharge: 2023-04-24 | Disposition: A | Discharge status: Home / Self Care | Payer: BC Managed Care – PPO | Attending: Family Medicine | Admitting: Family Medicine

## 2023-04-23 ENCOUNTER — Observation Stay (HOSPITAL_BASED_OUTPATIENT_CLINIC_OR_DEPARTMENT_OTHER): Payer: BC Managed Care – PPO

## 2023-04-23 ENCOUNTER — Encounter (HOSPITAL_COMMUNITY): Payer: Self-pay | Admitting: Emergency Medicine

## 2023-04-23 ENCOUNTER — Other Ambulatory Visit: Payer: Self-pay

## 2023-04-23 DIAGNOSIS — F17210 Nicotine dependence, cigarettes, uncomplicated: Secondary | ICD-10-CM | POA: Diagnosis not present

## 2023-04-23 DIAGNOSIS — R42 Dizziness and giddiness: Secondary | ICD-10-CM | POA: Diagnosis not present

## 2023-04-23 DIAGNOSIS — I214 Non-ST elevation (NSTEMI) myocardial infarction: Principal | ICD-10-CM

## 2023-04-23 DIAGNOSIS — Z79899 Other long term (current) drug therapy: Secondary | ICD-10-CM | POA: Insufficient documentation

## 2023-04-23 DIAGNOSIS — F1090 Alcohol use, unspecified, uncomplicated: Secondary | ICD-10-CM | POA: Insufficient documentation

## 2023-04-23 DIAGNOSIS — Z7982 Long term (current) use of aspirin: Secondary | ICD-10-CM | POA: Diagnosis not present

## 2023-04-23 DIAGNOSIS — D72829 Elevated white blood cell count, unspecified: Secondary | ICD-10-CM | POA: Diagnosis not present

## 2023-04-23 DIAGNOSIS — R079 Chest pain, unspecified: Secondary | ICD-10-CM

## 2023-04-23 DIAGNOSIS — I10 Essential (primary) hypertension: Secondary | ICD-10-CM | POA: Diagnosis not present

## 2023-04-23 DIAGNOSIS — K219 Gastro-esophageal reflux disease without esophagitis: Secondary | ICD-10-CM | POA: Diagnosis not present

## 2023-04-23 DIAGNOSIS — J449 Chronic obstructive pulmonary disease, unspecified: Secondary | ICD-10-CM | POA: Insufficient documentation

## 2023-04-23 LAB — LIPASE, BLOOD: Lipase: 34 U/L (ref 11–51)

## 2023-04-23 LAB — COMPREHENSIVE METABOLIC PANEL
ALT: 17 U/L (ref 0–44)
AST: 20 U/L (ref 15–41)
Albumin: 3.7 g/dL (ref 3.5–5.0)
Alkaline Phosphatase: 50 U/L (ref 38–126)
Anion gap: 10 (ref 5–15)
BUN: 15 mg/dL (ref 6–20)
CO2: 22 mmol/L (ref 22–32)
Calcium: 9.6 mg/dL (ref 8.9–10.3)
Chloride: 104 mmol/L (ref 98–111)
Creatinine, Ser: 0.64 mg/dL (ref 0.61–1.24)
GFR, Estimated: >60 mL/min (ref >60)
Glucose, Bld: 106 mg/dL — ABNORMAL HIGH (ref 70–99)
Potassium: 4.1 mmol/L (ref 3.5–5.1)
Sodium: 136 mmol/L (ref 135–145)
Total Bilirubin: 0.4 mg/dL (ref 0.0–1.2)
Total Protein: 6.4 g/dL — ABNORMAL LOW (ref 6.5–8.1)

## 2023-04-23 LAB — ECHOCARDIOGRAM COMPLETE
Area-P 1/2: 2.66 cm2
Height: 67 in
S' Lateral: 3.4 cm
Weight: 2294.41 [oz_av]

## 2023-04-23 LAB — LIPID PANEL
Cholesterol: 267 mg/dL — ABNORMAL HIGH (ref 0–200)
HDL: 85 mg/dL (ref >40)
LDL Cholesterol: 172 mg/dL — ABNORMAL HIGH (ref 0–99)
Total CHOL/HDL Ratio: 3.1 {ratio}
Triglycerides: 51 mg/dL (ref <150)
VLDL: 10 mg/dL (ref 0–40)

## 2023-04-23 LAB — TROPONIN I (HIGH SENSITIVITY)
Troponin I (High Sensitivity): 64 ng/L — ABNORMAL HIGH (ref <18)
Troponin I (High Sensitivity): 129 ng/L (ref <18)
Troponin I (High Sensitivity): 633 ng/L (ref <18)
Troponin I (High Sensitivity): 961 ng/L (ref <18)
Troponin I (High Sensitivity): 1241 ng/L (ref <18)
Troponin I (High Sensitivity): 1541 ng/L (ref <18)

## 2023-04-23 LAB — BASIC METABOLIC PANEL
Anion gap: 10 (ref 5–15)
BUN: 16 mg/dL (ref 6–20)
CO2: 23 mmol/L (ref 22–32)
Calcium: 9.5 mg/dL (ref 8.9–10.3)
Chloride: 103 mmol/L (ref 98–111)
Creatinine, Ser: 0.62 mg/dL (ref 0.61–1.24)
GFR, Estimated: >60 mL/min (ref >60)
Glucose, Bld: 106 mg/dL — ABNORMAL HIGH (ref 70–99)
Potassium: 4.8 mmol/L (ref 3.5–5.1)
Sodium: 136 mmol/L (ref 135–145)

## 2023-04-23 LAB — CBC
HCT: 41.8 % (ref 39.0–52.0)
Hemoglobin: 14.4 g/dL (ref 13.0–17.0)
MCH: 32.9 pg (ref 26.0–34.0)
MCHC: 34.4 g/dL (ref 30.0–36.0)
MCV: 95.4 fL (ref 80.0–100.0)
Platelets: 330 10*3/uL (ref 150–400)
RBC: 4.38 MIL/uL (ref 4.22–5.81)
RDW: 13.5 % (ref 11.5–15.5)
WBC: 16.5 10*3/uL — ABNORMAL HIGH (ref 4.0–10.5)
nRBC: 0 % (ref 0.0–0.2)

## 2023-04-23 LAB — HEPARIN LEVEL (UNFRACTIONATED): Heparin Unfractionated: 0.17 [IU]/mL — ABNORMAL LOW (ref 0.30–0.70)

## 2023-04-23 LAB — MAGNESIUM: Magnesium: 2.3 mg/dL (ref 1.7–2.4)

## 2023-04-23 MED ORDER — ASPIRIN 81 MG PO TBEC
81.0000 mg | DELAYED_RELEASE_TABLET | Freq: Every day | ORAL | Status: DC
  Administered 2023-04-24: 81 mg via ORAL
  Filled 2023-04-23: qty 1

## 2023-04-23 MED ORDER — PREDNISONE 20 MG PO TABS
40.0000 mg | ORAL_TABLET | Freq: Every day | ORAL | Status: DC
  Administered 2023-04-24: 40 mg via ORAL
  Filled 2023-04-23: qty 2

## 2023-04-23 MED ORDER — PANTOPRAZOLE SODIUM 40 MG PO TBEC
40.0000 mg | DELAYED_RELEASE_TABLET | Freq: Every day | ORAL | Status: DC
  Administered 2023-04-23 – 2023-04-24 (×2): 40 mg via ORAL
  Filled 2023-04-23 (×2): qty 1

## 2023-04-23 MED ORDER — HEPARIN BOLUS VIA INFUSION
4000.0000 [IU] | Freq: Once | INTRAVENOUS | Status: AC
  Administered 2023-04-23: 4000 [IU] via INTRAVENOUS
  Filled 2023-04-23: qty 4000

## 2023-04-23 MED ORDER — NITROGLYCERIN IN D5W 200-5 MCG/ML-% IV SOLN
0.0000 ug/min | INTRAVENOUS | Status: DC
  Administered 2023-04-23: 2.5 ug/min via INTRAVENOUS
  Filled 2023-04-23: qty 250

## 2023-04-23 MED ORDER — MORPHINE SULFATE (PF) 2 MG/ML IV SOLN: 2.0000 mg | INTRAVENOUS | Status: DC | PRN

## 2023-04-23 MED ORDER — CARVEDILOL 3.125 MG PO TABS
3.1250 mg | ORAL_TABLET | Freq: Two times a day (BID) | ORAL | Status: DC
Start: 2023-04-23 — End: 2023-04-24
  Administered 2023-04-23 – 2023-04-24 (×3): 3.125 mg via ORAL
  Filled 2023-04-23 (×3): qty 1

## 2023-04-23 MED ORDER — ONDANSETRON HCL 4 MG/2ML IJ SOLN
4.0000 mg | Freq: Four times a day (QID) | INTRAMUSCULAR | Status: DC | PRN
Start: 2023-04-23 — End: 2023-04-24

## 2023-04-23 MED ORDER — ATORVASTATIN CALCIUM 40 MG PO TABS
40.0000 mg | ORAL_TABLET | Freq: Every day | ORAL | Status: DC
  Administered 2023-04-23: 40 mg via ORAL
  Filled 2023-04-23: qty 1

## 2023-04-23 MED ORDER — ACETAMINOPHEN 325 MG PO TABS
650.0000 mg | ORAL_TABLET | ORAL | Status: DC | PRN
Start: 2023-04-23 — End: 2023-04-24
  Administered 2023-04-24: 650 mg via ORAL
  Filled 2023-04-23 (×2): qty 2

## 2023-04-23 MED ORDER — NICOTINE 21 MG/24HR TD PT24
21.0000 mg | MEDICATED_PATCH | Freq: Every day | TRANSDERMAL | Status: DC
  Administered 2023-04-23 – 2023-04-24 (×2): 21 mg via TRANSDERMAL
  Filled 2023-04-23 (×2): qty 1

## 2023-04-23 MED ORDER — MORPHINE SULFATE (PF) 2 MG/ML IV SOLN
2.0000 mg | Freq: Once | INTRAVENOUS | Status: AC
  Administered 2023-04-23: 2 mg via INTRAVENOUS
  Filled 2023-04-23: qty 1

## 2023-04-23 MED ORDER — ATORVASTATIN CALCIUM 80 MG PO TABS
80.0000 mg | ORAL_TABLET | Freq: Every day | ORAL | Status: DC
  Administered 2023-04-24: 80 mg via ORAL
  Filled 2023-04-23: qty 1

## 2023-04-23 MED ORDER — HEPARIN (PORCINE) 25000 UT/250ML-% IV SOLN
1000.0000 [IU]/h | INTRAVENOUS | Status: DC
  Administered 2023-04-23: 750 [IU]/h via INTRAVENOUS
  Administered 2023-04-24: 1000 [IU]/h via INTRAVENOUS
  Filled 2023-04-23 (×2): qty 250

## 2023-04-23 MED ORDER — HEPARIN BOLUS VIA INFUSION
2000.0000 [IU] | Freq: Once | INTRAVENOUS | Status: AC
  Administered 2023-04-23: 2000 [IU] via INTRAVENOUS
  Filled 2023-04-23: qty 2000

## 2023-04-23 NOTE — ED Notes (Signed)
Alert, NAD, calm, interactive, resps e/u, speaking clearly, visiting with sister at [place], VSS, ntg gtt started, monitoring BP.
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
## 2023-04-23 NOTE — ED Notes (Signed)
Carvedilol given, pt sleeping on R side, ntg infusing, will monitor

## 2023-04-23 NOTE — Progress Notes (Signed)
  Echocardiogram 2D Echocardiogram has been performed.  Leda Roys RDCS 04/23/2023, 1:38 PM

## 2023-04-23 NOTE — Progress Notes (Signed)
Nurse paged reporting patient 's Hs trop elevated to 633. Repeat EKG remains largely unchanged. He has no chest pain or new complaints, on heparin gtt and nitroglycerin gtt. advised nursing staff to continue trend troponin, call back if patient develops refractory chest pain on medical therapy, otherwise will continue plan for cardiac catheterization tomorrow.       Informed Consent   Shared Decision Making/Informed Consent  The risks [stroke (1 in 1000), death (1 in 1000), kidney failure [usually temporary] (1 in 500), bleeding (1 in 200), allergic reaction [possibly serious] (1 in 200)], benefits (diagnostic support and management of coronary artery disease) and alternatives of a cardiac catheterization were discussed in detail with [name] and he is willing to proceed.      Cath order has been placed.
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
## 2023-04-23 NOTE — Hospital Course (Addendum)
[name] is 52 y.o. male with hypertension, tobacco use, COPD, history of NSTEMI in 2019, eczema, who presented with substernal chest pain with radiation to jaw and left arm with associated diaphoresis.  In the ED initial troponin was elevated at 64 and continued rising to a peak of 1697.  EKG with sinus rhythm, LVH, upsloping ST and aVF and V2-V6 similar to prior EKG in 2020.  Cardiology was consulted.  Patient was started on heparin drip, aspirin, Lipitor, Coreg.  Patient was admitted for further evaluation.  Was initiated on a nitro drip.  Chest pain quickly resolved.  Patient went for left heart cath on 12/31.  Minimal CAD was found.  No lesion seen to explain the elevated troponin levels.  Most notable lesion was 35% in the mid RCA.  Normal LVEDP and no regional wall motion abnormalities by echocardiogram.  It was recommended that patient initiate Plavix therapy for 6 months as symptoms may be secondary to ruptured plaque.  At this time blood pressure is unlikely to tolerate additional medications given he has already been started on a beta-blocker, but he can be considered for calcium channel blocker outpatient if needed for spasm.  After left heart cath patient reported feeling back to his baseline and was requesting to discharge home.  He was cleared by cardiology to do so.  He will need follow-up closely with his primary care physician for continued monitoring of his chronic conditions.  NSTEMI - HS Trop 64 -> 633 -> 1697 - LHC with cardiology: No lesion to explain the elevated troponin levels, most notable lesion 35% in the mid RCA.  Minimal CAD.  Normal LVEDP and no regional wall motion abnormalities by echocardiogram. - Continue aspirin, Lipitor, Coreg - Restratification: LDL 172, hemoglobin A1c pending at discharge - Echo: LVEF 55 to 60%, no regional wall motion abnormalities, no LVH, grade 1 diastolic dysfunction   History of NSTEMI-2019 - Without significant CAD on cardiac cath in 2019    Hypertension - No antihypertensives at home - Currently on Coreg twice daily -- Consider addition of CCB   Hyperlipidemia - Statin   Echo abuse - 1 pack/day x 40 years - Patient has been counseled on smoking cessation   COPD - No maintenance meds at home - Currently maintaining O2 sats on room air --Nebs as needed   Leukocytosis - Patient is on chronic steroid therapy for eczema - Afebrile, no signs or symptoms of systemic infection - Trend WBC and fever curve   Eczema - Chronic.  Mainly affecting posterior knees bilaterally, decubital area of bilateral elbows, right abdomen.  Status post 5 days prednisone 60 mg, currently 40 mg daily for 5 days - Will continue with outpatient taper plan   GERD - Initiate PPI therapy - Worsening reflux symptoms since initiation of prednisone outpatient   Alcohol use - History of heavy EtOH, now primarily drinks on weekends - CIWA monitoring, no need for Ativan during admission - Counseled on alcohol cessation

## 2023-04-23 NOTE — ED Triage Notes (Addendum)
Patient arrives via EMS from work with midsternal chest pain radiating to left shoulder and arm and left jaw up to ear. Per EMS, the chest pain started at approximately 0700 today. Per EMS, the patient reported that the pain initially felt like heartburn but then worsened and the patient states he was diaphoretic. Patient received 324mg  ASA and 2 SL nitroglycerin en route from EMS. Patient is AAOx4; speaking in clear, full sentences upon arrival.

## 2023-04-23 NOTE — ED Provider Notes (Signed)
[place] EMERGENCY DEPARTMENT AT [facility] Provider Note   CSN: [id number] Arrival date & time: 04/23/23  7253     History  Chief Complaint  Patient presents with   Chest Pain    [name] is a 52 y.o. male.  This is a 52 year old male presenting emergency department with chest pain.  Was at work and had some feelings of heartburn and then developed central substernal chest pain described as pressure radiating to his jaw into his left arm.  Reports being diaphoretic.  Reports after nitro given by EMS pain 5 out of 10.  Does not follow with cardiologist, reports having cardiac evaluation done 4 years ago or so for similar type episode.   Chest Pain      Home Medications Prior to Admission medications   Medication Sig Start Date End Date Taking? Authorizing Provider  Aspirin-Salicylamide-Caffeine (ARTHRITIS STRENGTH BC POWDER PO) Take 1 packet by mouth 2 (two) times daily as needed (for pain).   Yes [provider]  EPINEPHrine 0.3 mg/0.3 mL IJ SOAJ injection Inject 0.3 mg into the muscle daily as needed. 02/22/21  Yes Rice, Jamesetta Orleans, MD  Fluocinonide 0.1 % CREA Apply a small amount to affected area twice a day if needed.  Don't use on the face. 04/17/23  Yes Joaquim Nam, MD  hydrOXYzine (ATARAX) 25 MG tablet Take 1 tablet (25 mg total) by mouth 2 (two) times daily as needed for itching (sedation precautions). Patient taking differently: Take 25 mg by mouth at bedtime as needed for itching (sedation precautions). 03/28/23  Yes Eustaquio Boyden, MD  predniSONE (DELTASONE) 20 MG tablet Take 3 a day for 5 days, then 2 a day for 5 days, then 1 a day for 5 days with food. Don't take with aleve/ibuprofen. 04/17/23  Yes Joaquim Nam, MD      Allergies    Bee venom, Celexa [citalopram hydrobromide], Oxycodone-acetaminophen, and Pristiq [desvenlafaxine succinate er]    Review of Systems   Review of Systems  Cardiovascular:  Positive for chest  pain.    Physical Exam Updated Vital Signs BP (!) 136/91 (BP Location: Left Arm)   Pulse 70   Temp 98.6 F (37 C) (Oral)   Resp 20   Ht 5\' 7"  (1.702 m)   Wt 65 kg   SpO2 100%   BMI 22.46 kg/m  Physical Exam Vitals and nursing note reviewed.  HENT:     Head: Normocephalic.  Cardiovascular:     Rate and Rhythm: Normal rate and regular rhythm.     Pulses:          Radial pulses are 2+ on the right side and 2+ on the left side.     Heart sounds: Normal heart sounds.  Pulmonary:     Effort: Pulmonary effort is normal.     Breath sounds: Normal breath sounds.  Musculoskeletal:     Right lower leg: No edema.     Left lower leg: No edema.  Skin:    General: Skin is warm.     Capillary Refill: Capillary refill takes less than 2 seconds.  Neurological:     Mental Status: He is alert and oriented to person, place, and time.  Psychiatric:        Mood and Affect: Mood normal.        Behavior: Behavior normal.     ED Results / Procedures / Treatments   Labs (all labs ordered are listed, but only abnormal results  are displayed) Labs Reviewed  CBC - Abnormal; Notable for the following components:      Result Value   WBC 16.5 (*)    All other components within normal limits  COMPREHENSIVE METABOLIC PANEL - Abnormal; Notable for the following components:   Glucose, Bld 106 (*)    Total Protein 6.4 (*)    All other components within normal limits  TROPONIN I (HIGH SENSITIVITY) - Abnormal; Notable for the following components:   Troponin I (High Sensitivity) 64 (*)    All other components within normal limits  LIPASE, BLOOD  HEMOGLOBIN A1C  LIPID PANEL  BASIC METABOLIC PANEL  TROPONIN I (HIGH SENSITIVITY)    EKG EKG Interpretation Date/Time:  Monday April 23 2023 09:41:21 EST Ventricular Rate:  77 PR Interval:  117 QRS Duration:  83 QT Interval:  394 QTC Calculation: 446 R Axis:   88  Text Interpretation: Sinus rhythm Borderline short PR interval Consider left  ventricular hypertrophy ST elev, probable normal early repol pattern Confirmed by Estanislado Pandy [id number]) on 04/23/2023 9:47:44 AM  Radiology DG Chest Port 1 View Result Date: 04/23/2023 CLINICAL DATA:  Chest pain EXAM: PORTABLE CHEST 1 VIEW COMPARISON:  CT 10/06/2022 FINDINGS: Normal heart size. No pleural effusion. Mild diffuse increase interstitial markings identified bilaterally. No consolidative change. Previous ORIF of the left clavicle. IMPRESSION: Mild diffuse increase interstitial markings bilaterally. Differential considerations include chronic interstitial lung disease versus mild interstitial edema. Electronically Signed   By: Signa Kell M.D.   On: 04/23/2023 11:33    Procedures Procedures    Medications Ordered in ED Medications  morphine (PF) 2 MG/ML injection 2 mg (has no administration in time range)  aspirin EC tablet 81 mg (has no administration in time range)  atorvastatin (LIPITOR) tablet 40 mg (has no administration in time range)  carvedilol (COREG) tablet 3.125 mg (has no administration in time range)    ED Course/ Medical Decision Making/ A&P Clinical Course as of 04/23/23 1206  Mon Apr 23, 2023  0945 Echo 11/22/18 per chart review : "IMPRESSIONS     1. The left ventricle has normal systolic function with an ejection  fraction of 60-65%. The cavity size was normal. Left ventricular diastolic  Doppler parameters are consistent with impaired relaxation.   2. The right ventricle has normal systolic function. The cavity was  normal. There is no increase in right ventricular wall thickness. Right  ventricular systolic pressure is normal with an estimated pressure of 30.4  mmHg.   3. The aortic valve is tricuspid. No stenosis of the aortic valve.   4. The aorta is normal in size and structure.  " [TY]  0958 Admitted 06/13/2017 for CP; per d/c summary : "Hospital Course: Chest pain - Patient was admitted to the hospital with chest pain.  Given risk factors,  slightly elevated troponin and T wave inversions on EKG, cardiology was consulted.  Patient underwent a cardiac catheterization on 06/14/2017 without significant coronary artery disease.  His chest pain resolved, he returned back to normal, and he was discharged home in stable condition.  Telemetry was unremarkable.  Of note, during cardiac catheterization underwent a LV ventriculography which was somewhat complicated by ventricular ectopy during the assessment, pattern suggesting low normal to mild reduction in EF in the 45-50% range.  Patient has no CHF symptoms, discussed with cardiology, no further interventions at this point other than lifestyle changes as below. Tobacco abuse -counseled for cessation, nicotine patch prescribed on discharge Alcohol use -counseled for cessation,  drinks about 5 beers every night " [TY]  1021 Patient's EKG with some hyperacute T waves.  Does have concerning story.  Cardiology consulted; they will take a look at EKG and evaluate patient. [TY]  1025 Cardiology reviewed EKG; do not think it is a STEMI.  They will see patient. [TY]  1052 Troponin I (High Sensitivity)(!): 64 [TY]  1058 Patient reports continued improvement of his chest pain. [TY]  1204 Spoke with cardiology; recommending medicine admission for NSTEMI.  Start heparin.  Plan for cath in the morning. [TY]    Clinical Course User Index [TY] Coral Spikes, DO                                 Medical Decision Making This is a 52 year old male with hypertension, tobacco use presenting emergency department for chest pain.  He is afebrile nontachycardi, mildly elevated blood pressure.  EMS reported giving aspirin and nitro prior to arrival with some improvement of his symptoms.  They did report he was hypertensive with a systolic blood pressure in the 180s.  EKG with some hyperacute T waves in the precordial leads, but no STEMI. Cardiology consulted; see ed course. Initial troponin 68.  Chest x-ray without  pneumonia or acute abnormality.  Does have a leukocytosis unclear etiology.  No significant metabolic derangements.  Normal kidney function.  Amount and/or Complexity of Data Reviewed Independent Historian:     Details: EMS gave ASA and subligual nitroglycerin prior to arrival.  External Data Reviewed:     Details: See ED course.  Labs: ordered. Decision-making details documented in ED Course. Radiology: ordered.  Risk Prescription drug management. Decision regarding hospitalization. Diagnosis or treatment significantly limited by social determinants of health. Risk Details: Poor health literacy. Alcohol abuse disorder.           Final Clinical Impression(s) / ED Diagnoses Final diagnoses:  None    Rx / DC Orders ED Discharge Orders     None         Coral Spikes, DO 04/23/23 1207
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
## 2023-04-23 NOTE — Plan of Care (Signed)
  Problem: Activity: Goal: Ability to tolerate increased activity will improve Outcome: Progressing   Problem: Clinical Measurements: Goal: Respiratory complications will improve Outcome: Progressing   Problem: Activity: Goal: Risk for activity intolerance will decrease Outcome: Progressing   Problem: Coping: Goal: Level of anxiety will decrease Outcome: Progressing   Problem: Pain Management: Goal: General experience of comfort will improve Outcome: Progressing   Problem: Safety: Goal: Ability to remain free from injury will improve Outcome: Progressing
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
## 2023-04-23 NOTE — ED Notes (Signed)
Describes self as restless sleeper, tosses from one side to the other, Lying on R side, BP reading lower, will monitor, adjusted ntg ggt.

## 2023-04-24 ENCOUNTER — Encounter (HOSPITAL_COMMUNITY): Admission: EM | Disposition: A | Discharge status: Home / Self Care | Payer: Self-pay | Source: Home / Self Care

## 2023-04-24 DIAGNOSIS — I2510 Atherosclerotic heart disease of native coronary artery without angina pectoris: Secondary | ICD-10-CM

## 2023-04-24 DIAGNOSIS — I214 Non-ST elevation (NSTEMI) myocardial infarction: Secondary | ICD-10-CM | POA: Diagnosis not present

## 2023-04-24 HISTORY — PX: LEFT HEART CATH AND CORONARY ANGIOGRAPHY: CATH118249

## 2023-04-24 LAB — PROTIME-INR
INR: 0.9 (ref 0.8–1.2)
Prothrombin Time: 12.6 s (ref 11.4–15.2)

## 2023-04-24 LAB — BASIC METABOLIC PANEL
Anion gap: 8 (ref 5–15)
BUN: 18 mg/dL (ref 6–20)
CO2: 24 mmol/L (ref 22–32)
Calcium: 8.9 mg/dL (ref 8.9–10.3)
Chloride: 105 mmol/L (ref 98–111)
Creatinine, Ser: 0.6 mg/dL — ABNORMAL LOW (ref 0.61–1.24)
GFR, Estimated: >60 mL/min (ref >60)
Glucose, Bld: 101 mg/dL — ABNORMAL HIGH (ref 70–99)
Potassium: 3.7 mmol/L (ref 3.5–5.1)
Sodium: 137 mmol/L (ref 135–145)

## 2023-04-24 LAB — MRSA NEXT GEN BY PCR, NASAL: MRSA by PCR Next Gen: NOT DETECTED

## 2023-04-24 LAB — TROPONIN I (HIGH SENSITIVITY): Troponin I (High Sensitivity): 1697 ng/L (ref <18)

## 2023-04-24 LAB — CBC
HCT: 41.3 % (ref 39.0–52.0)
Hemoglobin: 14.3 g/dL (ref 13.0–17.0)
MCH: 32.9 pg (ref 26.0–34.0)
MCHC: 34.6 g/dL (ref 30.0–36.0)
MCV: 94.9 fL (ref 80.0–100.0)
Platelets: 317 10*3/uL (ref 150–400)
RBC: 4.35 MIL/uL (ref 4.22–5.81)
RDW: 13.6 % (ref 11.5–15.5)
WBC: 13.9 10*3/uL — ABNORMAL HIGH (ref 4.0–10.5)
nRBC: 0 % (ref 0.0–0.2)

## 2023-04-24 LAB — HEMOGLOBIN A1C
Hgb A1c MFr Bld: 5.6 % (ref 4.8–5.6)
Mean Plasma Glucose: 114 mg/dL

## 2023-04-24 LAB — HEPARIN LEVEL (UNFRACTIONATED)
Heparin Unfractionated: 0.33 [IU]/mL (ref 0.30–0.70)
Heparin Unfractionated: 0.27 [IU]/mL — ABNORMAL LOW (ref 0.30–0.70)

## 2023-04-24 LAB — HIV ANTIBODY (ROUTINE TESTING W REFLEX): HIV Screen 4th Generation wRfx: NONREACTIVE

## 2023-04-24 SURGERY — LEFT HEART CATH AND CORONARY ANGIOGRAPHY
Anesthesia: LOCAL

## 2023-04-24 MED ORDER — SODIUM CHLORIDE 0.9 % IV SOLN: INTRAVENOUS | Status: AC

## 2023-04-24 MED ORDER — LABETALOL HCL 5 MG/ML IV SOLN: 10.0000 mg | INTRAVENOUS | Status: DC | PRN

## 2023-04-24 MED ORDER — ASPIRIN 81 MG PO TBEC: 81.0000 mg | DELAYED_RELEASE_TABLET | Freq: Every day | ORAL | 12 refills | Status: AC

## 2023-04-24 MED ORDER — CARVEDILOL 3.125 MG PO TABS: 3.1250 mg | ORAL_TABLET | Freq: Two times a day (BID) | ORAL | 0 refills | Status: DC

## 2023-04-24 MED ORDER — VERAPAMIL HCL 2.5 MG/ML IV SOLN
INTRAVENOUS | Status: AC
  Filled 2023-04-24: qty 2

## 2023-04-24 MED ORDER — HEPARIN SODIUM (PORCINE) 1000 UNIT/ML IJ SOLN
INTRAMUSCULAR | Status: AC
  Filled 2023-04-24: qty 10

## 2023-04-24 MED ORDER — HYDRALAZINE HCL 20 MG/ML IJ SOLN: 10.0000 mg | INTRAMUSCULAR | Status: DC | PRN

## 2023-04-24 MED ORDER — HEPARIN SODIUM (PORCINE) 1000 UNIT/ML IJ SOLN
INTRAMUSCULAR | Status: DC | PRN
  Administered 2023-04-24: 3500 [IU] via INTRAVENOUS

## 2023-04-24 MED ORDER — HEPARIN (PORCINE) IN NACL 1000-0.9 UT/500ML-% IV SOLN
INTRAVENOUS | Status: DC | PRN
  Administered 2023-04-24 (×2): 500 mL via INTRA_ARTERIAL

## 2023-04-24 MED ORDER — FENTANYL CITRATE (PF) 100 MCG/2ML IJ SOLN
INTRAMUSCULAR | Status: AC
  Filled 2023-04-24: qty 2

## 2023-04-24 MED ORDER — LIDOCAINE HCL (PF) 1 % IJ SOLN
INTRAMUSCULAR | Status: DC | PRN
  Administered 2023-04-24: 5 mL

## 2023-04-24 MED ORDER — CLOPIDOGREL BISULFATE 75 MG PO TABS: 75.0000 mg | ORAL_TABLET | Freq: Every day | ORAL | Status: DC

## 2023-04-24 MED ORDER — LIDOCAINE HCL (PF) 1 % IJ SOLN
INTRAMUSCULAR | Status: AC
  Filled 2023-04-24: qty 30

## 2023-04-24 MED ORDER — MIDAZOLAM HCL 2 MG/2ML IJ SOLN
INTRAMUSCULAR | Status: AC
  Filled 2023-04-24: qty 2

## 2023-04-24 MED ORDER — FAMOTIDINE 20 MG PO TABS: 20.0000 mg | ORAL_TABLET | Freq: Two times a day (BID) | ORAL | Status: DC | PRN

## 2023-04-24 MED ORDER — ATORVASTATIN CALCIUM 80 MG PO TABS: 80.0000 mg | ORAL_TABLET | Freq: Every day | ORAL | 0 refills | Status: DC

## 2023-04-24 MED ORDER — SODIUM CHLORIDE 0.9% FLUSH: 3.0000 mL | INTRAVENOUS | Status: DC | PRN

## 2023-04-24 MED ORDER — FENTANYL CITRATE (PF) 100 MCG/2ML IJ SOLN
INTRAMUSCULAR | Status: DC | PRN
  Administered 2023-04-24 (×2): 25 ug via INTRAVENOUS

## 2023-04-24 MED ORDER — CLOPIDOGREL BISULFATE 75 MG PO TABS: 75.0000 mg | ORAL_TABLET | Freq: Every day | ORAL | 0 refills | Status: AC

## 2023-04-24 MED ORDER — SODIUM CHLORIDE 0.9% FLUSH
3.0000 mL | Freq: Two times a day (BID) | INTRAVENOUS | Status: DC
  Administered 2023-04-24: 3 mL via INTRAVENOUS

## 2023-04-24 MED ORDER — MIDAZOLAM HCL 2 MG/2ML IJ SOLN
INTRAMUSCULAR | Status: DC | PRN
  Administered 2023-04-24 (×2): 1 mg via INTRAVENOUS

## 2023-04-24 MED ORDER — SODIUM CHLORIDE 0.9 % IV SOLN: 250.0000 mL | INTRAVENOUS | Status: DC | PRN

## 2023-04-24 MED ORDER — IOHEXOL 350 MG/ML SOLN
INTRAVENOUS | Status: DC | PRN
  Administered 2023-04-24: 45 mL via INTRA_ARTERIAL

## 2023-04-24 MED ORDER — VERAPAMIL HCL 2.5 MG/ML IV SOLN
INTRAVENOUS | Status: DC | PRN
  Administered 2023-04-24: 10 mL via INTRA_ARTERIAL

## 2023-04-24 SURGICAL SUPPLY — 8 items
CATH INFINITI AMBI 5FR TG (CATHETERS) IMPLANT
DEVICE RAD COMP TR BAND LRG (VASCULAR PRODUCTS) IMPLANT
GLIDESHEATH SLEND SS 6F .021 (SHEATH) IMPLANT
GUIDEWIRE INQWIRE 1.5J.035X260 (WIRE) IMPLANT
INQWIRE 1.5J .035X260CM (WIRE) ×1 IMPLANT
PACK CARDIAC CATHETERIZATION (CUSTOM PROCEDURE TRAY) ×1 IMPLANT
SET ATX-X65L (MISCELLANEOUS) IMPLANT
SHEATH PROBE COVER 6X72 (BAG) IMPLANT

## 2023-04-24 NOTE — Progress Notes (Signed)
 Progress Note  Patient Name: [name] Date of Encounter: 04/24/2023  Primary Cardiologist: Maude Emmer, MD   Subjective   Patient seen and examined at his bedside. Aware of his heart cath todays  Inpatient Medications    Scheduled Meds:  aspirin  EC  81 mg Oral Daily   atorvastatin   80 mg Oral Daily   carvedilol   3.125 mg Oral BID WC   nicotine   21 mg Transdermal Daily   pantoprazole   40 mg Oral Daily   predniSONE   40 mg Oral Q breakfast   Continuous Infusions:  sodium chloride  40 mL/hr at 04/24/23 0703   heparin  1,000 Units/hr (04/23/23 1855)   nitroGLYCERIN  30 mcg/min (04/23/23 1900)   PRN Meds: acetaminophen , morphine  injection, ondansetron  (ZOFRAN ) IV   Vital Signs    Vitals:   04/23/23 2006 04/23/23 2300 04/24/23 0233 04/24/23 0748  BP: (!) 117/98 109/76 121/81 112/87  Pulse: 88 69 72 80  Resp: 20 14 16 15   Temp: 98.3 F (36.8 C) 98 F (36.7 C) 97.9 F (36.6 C) 97.9 F (36.6 C)  TempSrc: Oral Oral Oral Oral  SpO2:  95% 97% 97%  Weight: 64.5 kg     Height: 5' 9 (1.753 m)       Intake/Output Summary (Last 24 hours) at 04/24/2023 9178 Last data filed at 04/24/2023 0747 Gross per 24 hour  Intake 304.2 ml  Output 875 ml  Net -570.8 ml   Filed Weights   04/23/23 0954 04/23/23 2006  Weight: 65 kg 64.5 kg    Telemetry    Sinus rhythm - Personally Reviewed  ECG     - Personally Reviewed  Physical Exam    General: Comfortable Head: Atraumatic, normal size  Eyes: PEERLA, EOMI  Neck: Supple, normal JVD Cardiac: Normal S1, S2; RRR; no murmurs, rubs, or gallops Lungs: Clear to auscultation bilaterally Abd: Soft, nontender, no hepatomegaly  Ext: warm, no edema Musculoskeletal: No deformities, BUE and BLE strength normal and equal Skin: Warm and dry, no rashes   Neuro: Alert and oriented to person, place, time, and situation, CNII-XII grossly intact, no focal deficits  Psych: Normal mood and affect   Labs    Chemistry Recent Labs   Lab 04/23/23 1004 04/23/23 1209 04/24/23 0235  NA 136 136 137  K 4.1 4.8 3.7  CL 104 103 105  CO2 22 23 24   GLUCOSE 106* 106* 101*  BUN 15 16 18   CREATININE 0.64 0.62 0.60*  CALCIUM  9.6 9.5 8.9  PROT 6.4*  --   --   ALBUMIN 3.7  --   --   AST 20  --   --   ALT 17  --   --   ALKPHOS 50  --   --   BILITOT 0.4  --   --   GFRNONAA >60 >60 >60  ANIONGAP 10 10 8      Hematology Recent Labs  Lab 04/23/23 1004 04/24/23 0235  WBC 16.5* 13.9*  RBC 4.38 4.35  HGB 14.4 14.3  HCT 41.8 41.3  MCV 95.4 94.9  MCH 32.9 32.9  MCHC 34.4 34.6  RDW 13.5 13.6  PLT 330 317    Cardiac EnzymesNo results for input(s): TROPONINI in the last 168 hours. No results for input(s): TROPIPOC in the last 168 hours.   BNPNo results for input(s): BNP, PROBNP in the last 168 hours.   DDimer No results for input(s): DDIMER in the last 168 hours.   Radiology    ECHOCARDIOGRAM COMPLETE Result  Date: 04/23/2023    ECHOCARDIOGRAM REPORT   Patient Name:   [name] Date of Exam: 04/23/2023 Medical Rec #:  [id number]    Height:       67.0 in Accession #:    [id number]   Weight:       143.4 lb Date of Birth:  February 24, 1971     BSA:          1.756 m Patient Age:    52 years     BP:           142/98 mmHg Patient Gender: M            HR:           67 bpm. Exam Location:  Inpatient Procedure: 2D Echo, Cardiac Doppler and Color Doppler Indications:    Chest Pain R07.9  History:        Patient has prior history of Echocardiogram examinations, most                 recent 11/22/2018.  Sonographer:    Tinnie Gosling RDCS Referring Phys: [id number] XIKA ZHAO IMPRESSIONS  1. Left ventricular ejection fraction, by estimation, is 55 to 60%. The left ventricle has normal function. The left ventricle has no regional wall motion abnormalities. Left ventricular diastolic parameters are consistent with Grade I diastolic dysfunction (impaired relaxation).  2. Right ventricular systolic function is normal. The right ventricular  size is normal.  3. The mitral valve is normal in structure. No evidence of mitral valve regurgitation. No evidence of mitral stenosis.  4. The aortic valve is tricuspid. Aortic valve regurgitation is not visualized. No aortic stenosis is present.  5. The inferior vena cava is normal in size with greater than 50% respiratory variability, suggesting right atrial pressure of 3 mmHg. FINDINGS  Left Ventricle: Left ventricular ejection fraction, by estimation, is 55 to 60%. The left ventricle has normal function. The left ventricle has no regional wall motion abnormalities. The left ventricular internal cavity size was normal in size. There is  no left ventricular hypertrophy. Left ventricular diastolic parameters are consistent with Grade I diastolic dysfunction (impaired relaxation). Normal left ventricular filling pressure. Right Ventricle: The right ventricular size is normal. No increase in right ventricular wall thickness. Right ventricular systolic function is normal. Left Atrium: Left atrial size was normal in size. Right Atrium: Right atrial size was normal in size. Pericardium: There is no evidence of pericardial effusion. Mitral Valve: The mitral valve is normal in structure. No evidence of mitral valve regurgitation. No evidence of mitral valve stenosis. Tricuspid Valve: The tricuspid valve is normal in structure. Tricuspid valve regurgitation is not demonstrated. No evidence of tricuspid stenosis. Aortic Valve: The aortic valve is tricuspid. Aortic valve regurgitation is not visualized. No aortic stenosis is present. Pulmonic Valve: The pulmonic valve was normal in structure. Pulmonic valve regurgitation is trivial. No evidence of pulmonic stenosis. Aorta: The aortic root, ascending aorta and aortic arch are all structurally normal, with no evidence of dilitation or obstruction. Venous: The inferior vena cava is normal in size with greater than 50% respiratory variability, suggesting right atrial pressure  of 3 mmHg. IAS/Shunts: No atrial level shunt detected by color flow Doppler.  LEFT VENTRICLE PLAX 2D LVIDd:         5.30 cm   Diastology LVIDs:         3.40 cm   LV e' medial:    8.38 cm/s LV PW:  1.00 cm   LV E/e' medial:  8.6 LV IVS:        1.00 cm   LV e' lateral:   11.40 cm/s LVOT diam:     2.20 cm   LV E/e' lateral: 6.4 LV SV:         87 LV SV Index:   50 LVOT Area:     3.80 cm  RIGHT VENTRICLE             IVC RV S prime:     12.10 cm/s  IVC diam: 1.80 cm TAPSE (M-mode): 1.7 cm LEFT ATRIUM             Index        RIGHT ATRIUM          Index LA diam:        3.20 cm 1.82 cm/m   RA Area:     9.68 cm LA Vol (A2C):   37.7 ml 21.47 ml/m  RA Volume:   20.50 ml 11.68 ml/m LA Vol (A4C):   28.8 ml 16.40 ml/m LA Biplane Vol: 35.0 ml 19.94 ml/m  AORTIC VALVE LVOT Vmax:   118.00 cm/s LVOT Vmean:  78.900 cm/s LVOT VTI:    0.230 m  AORTA Ao Root diam: 3.00 cm Ao Asc diam:  3.20 cm MITRAL VALVE MV Area (PHT): 2.66 cm    SHUNTS MV Decel Time: 285 msec    Systemic VTI:  0.23 m MV E velocity: 72.40 cm/s  Systemic Diam: 2.20 cm MV A velocity: 76.10 cm/s MV E/A ratio:  0.95 Annabella Scarce MD Electronically signed by Annabella Scarce MD Signature Date/Time: 04/23/2023/5:06:01 PM    Final    DG Chest Port 1 View Result Date: 04/23/2023 CLINICAL DATA:  Chest pain EXAM: PORTABLE CHEST 1 VIEW COMPARISON:  CT 10/06/2022 FINDINGS: Normal heart size. No pleural effusion. Mild diffuse increase interstitial markings identified bilaterally. No consolidative change. Previous ORIF of the left clavicle. IMPRESSION: Mild diffuse increase interstitial markings bilaterally. Differential considerations include chronic interstitial lung disease versus mild interstitial edema. Electronically Signed   By: Waddell Calk M.D.   On: 04/23/2023 11:33    Cardiac Studies   Echo normal  Patient Profile     52 y.o. male presents with NSTEMI  Assessment & Plan    NSTEMI Tobacco use  Alcohol abuse GERD  Anxiety   He is  schedule for LHC today, previous cath in 2019 reviewed.  Symptoms has resolved with heparin  and nitroglycerin .  Echo with normal EF.  Continue Heparin  gtt, ASA 81mg  daily, lipitor 40mg  daily, and coreg  3.125mg  BID. Smoking cessation advised.  Further recommendations post cardiac cath.     Informed Consent   Shared Decision Making/Informed Consent The risks [stroke (1 in 1000), death (1 in 1000), kidney failure [usually temporary] (1 in 500), bleeding (1 in 200), allergic reaction [possibly serious] (1 in 200)], benefits (diagnostic support and management of coronary artery disease) and alternatives of a cardiac catheterization were discussed in detail with Mr. Clopper and he is willing to proceed.     For questions or updates, please contact [facility] Please consult [email or web address] for contact info under Cardiology/STEMI.      Signed, Lakenya Riendeau, DO  04/24/2023, 8:21 AM

## 2023-04-24 NOTE — Interval H&P Note (Signed)
 History and Physical Interval Note:  04/24/2023 11:53 AM  [name]  has presented today for surgery, with the diagnosis of NSTEMI.    The various methods of treatment have been discussed with the patient and family. After consideration of risks, benefits and other options for treatment, the patient has consented to  Procedure(s): LEFT HEART CATH AND CORONARY ANGIOGRAPHY (N/A)  PERCUTANEOUS CORONARY INTERVENTION  as a surgical intervention.  The patient's history has been reviewed, patient examined, no change in status, stable for surgery.  I have reviewed the patient's chart and labs.  Questions were answered to the patient's satisfaction.    Cath Lab Visit (complete for each Cath Lab visit)  Clinical Evaluation Leading to the Procedure:   ACS: Yes.    Non-ACS:    Anginal Classification: CCS IV  Anti-ischemic medical therapy: Minimal Therapy (1 class of medications)  Non-Invasive Test Results: No non-invasive testing performed  Prior CABG: No previous CABG     Alm Clay

## 2023-04-24 NOTE — Plan of Care (Signed)
  Problem: Education: Goal: Understanding of cardiac disease, CV risk reduction, and recovery process will improve Outcome: Progressing   Problem: Education: Goal: Individualized Educational Video(s) Outcome: Progressing   Problem: Activity: Goal: Ability to tolerate increased activity will improve Outcome: Progressing   Problem: Cardiac: Goal: Ability to achieve and maintain adequate cardiovascular perfusion will improve Outcome: Progressing   Problem: Health Behavior/Discharge Planning: Goal: Ability to safely manage health-related needs after discharge will improve Outcome: Progressing
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
## 2023-04-24 NOTE — Discharge Summary (Signed)
 Physician Discharge Summary   Patient: [name] MRN: [id number] DOB: 1971/01/01  Admit date:     04/23/2023  Discharge date: 04/24/23  Discharge Physician: Lorane Poland   PCP: Rilla Baller, MD   Recommendations at discharge:    Follow up with PCP  Discharge Diagnoses: Principal Problem:   NSTEMI (non-ST elevated myocardial infarction) (HCC)  Resolved Problems:   * No resolved hospital problems. *  Hospital Course: [name] is 52 y.o. male with hypertension, tobacco use, COPD, history of NSTEMI in 2019, eczema, who presented with substernal chest pain with radiation to jaw and left arm with associated diaphoresis.  In the ED initial troponin was elevated at 64 and continued rising to a peak of 1697.  EKG with sinus rhythm, LVH, upsloping ST and aVF and V2-V6 similar to prior EKG in 2020.  Cardiology was consulted.  Patient was started on heparin  drip, aspirin , Lipitor, Coreg .  Patient was admitted for further evaluation.  Was initiated on a nitro drip.  Chest pain quickly resolved.  Patient went for left heart cath on 12/31.  Minimal CAD was found.  No lesion seen to explain the elevated troponin levels.  Most notable lesion was 35% in the mid RCA.  Normal LVEDP and no regional wall motion abnormalities by echocardiogram.  It was recommended that patient initiate Plavix  therapy for 6 months as symptoms may be secondary to ruptured plaque.  At this time blood pressure is unlikely to tolerate additional medications given he has already been started on a beta-blocker, but he can be considered for calcium  channel blocker outpatient if needed for spasm.  After left heart cath patient reported feeling back to his baseline and was requesting to discharge home.  He was cleared by cardiology to do so.  He will need follow-up closely with his primary care physician for continued monitoring of his chronic conditions.  NSTEMI - HS Trop 64 -> 633 -> 1697 - LHC with cardiology: No lesion to  explain the elevated troponin levels, most notable lesion 35% in the mid RCA.  Minimal CAD.  Normal LVEDP and no regional wall motion abnormalities by echocardiogram. - Continue aspirin , Lipitor, Coreg  - Restratification: LDL 172, hemoglobin A1c pending at discharge - Echo: LVEF 55 to 60%, no regional wall motion abnormalities, no LVH, grade 1 diastolic dysfunction   History of NSTEMI-2019 - Without significant CAD on cardiac cath in 2019   Hypertension - No antihypertensives at home - Currently on Coreg  twice daily -- Consider addition of CCB   Hyperlipidemia - Statin   Echo abuse - 1 pack/day x 40 years - Patient has been counseled on smoking cessation   COPD - No maintenance meds at home - Currently maintaining O2 sats on room air --Nebs as needed   Leukocytosis - Patient is on chronic steroid therapy for eczema - Afebrile, no signs or symptoms of systemic infection - Trend WBC and fever curve   Eczema - Chronic.  Mainly affecting posterior knees bilaterally, decubital area of bilateral elbows, right abdomen.  Status post 5 days prednisone  60 mg, currently 40 mg daily for 5 days - Will continue with outpatient taper plan   GERD - Initiate PPI therapy - Worsening reflux symptoms since initiation of prednisone  outpatient   Alcohol use - History of heavy EtOH, now primarily drinks on weekends - CIWA monitoring, no need for Ativan  during admission - Counseled on alcohol cessation   Consultants: Cardiology Procedures performed: LHC  Disposition: Home Diet recommendation:  Discharge  Diet Orders (From admission, onward)     Start     Ordered   04/24/23 0000  Diet - low sodium heart healthy        04/24/23 1701           Heart Healthy diet  DISCHARGE MEDICATION: Allergies as of 04/24/2023       Reactions   Bee Venom Hives, Shortness Of Breath, Itching, Swelling   Throat tightened and eyelids & face became swollen, also   Celexa  [citalopram  Hydrobromide]  Other (See Comments)   Sexual dysfunction   Oxycodone -acetaminophen  Itching   Pristiq  [desvenlafaxine  Succinate Er]    Sexual dysufnction, lightheadedness        Medication List     STOP taking these medications    ARTHRITIS STRENGTH BC POWDER PO       TAKE these medications    aspirin  EC 81 MG tablet Take 1 tablet (81 mg total) by mouth daily. Swallow whole. Start taking on: April 25, 2023   atorvastatin  80 MG tablet Commonly known as: LIPITOR Take 1 tablet (80 mg total) by mouth daily. Start taking on: April 25, 2023   carvedilol  3.125 MG tablet Commonly known as: COREG  Take 1 tablet (3.125 mg total) by mouth 2 (two) times daily with a meal. Start taking on: April 25, 2023   clopidogrel  75 MG tablet Commonly known as: PLAVIX  Take 1 tablet (75 mg total) by mouth daily with breakfast. Start taking on: April 25, 2023   EPINEPHrine  0.3 mg/0.3 mL Soaj injection Commonly known as: EPI-PEN Inject 0.3 mg into the muscle daily as needed.   Fluocinonide  0.1 % Crea Apply a small amount to affected area twice a day if needed.  Don't use on the face.   hydrOXYzine  25 MG tablet Commonly known as: ATARAX  Take 1 tablet (25 mg total) by mouth 2 (two) times daily as needed for itching (sedation precautions). What changed: when to take this   predniSONE  20 MG tablet Commonly known as: DELTASONE  Take 3 a day for 5 days, then 2 a day for 5 days, then 1 a day for 5 days with food. Don't take with aleve /ibuprofen .        Discharge Exam: Filed Weights   04/23/23 0954 04/23/23 2006  Weight: 65 kg 64.5 kg   Constitutional:  Normal appearance. Non toxic-appearing.  HENT: Head Normocephalic and atraumatic.  Mucous membranes are moist.  Eyes:  Extraocular intact. Conjunctivae normal. Pupils are equal, round, and reactive to light.  Cardiovascular: Rate and Rhythm: Normal rate and regular rhythm.  Pulmonary: Non labored, symmetric rise of chest wall.  Musculoskeletal:   Normal range of motion.  Neurological: No focal deficit present. alert. Oriented. Psychiatric: Mood and Affect congruent.    Condition at discharge: Stable.  The results of significant diagnostics from this hospitalization (including imaging, microbiology, ancillary and laboratory) are listed below for reference.   Imaging Studies: CARDIAC CATHETERIZATION Result Date: 04/24/2023 No culprit lesion to explain elevated troponin levels, the most notable lesion is a 35% somewhat irregular lesion in the mid RCA at marginal branch. Otherwise minimal CAD. Normal LVEDP with normal LV function and no wall motion normalities by Echocardiogram. RECOMMENDATIONS   In the absence of any other complications or medical issues, we expect the patient to be ready for discharge from a cath perspective on 04/24/2023.   Consider calcium  channel blocker to treat potential spasm   Recommend Aspirin  81mg  daily for moderate CAD.   With non-STEMI presentation, with reasonable to treat  with Plavix  for 6 months, but okay to interrupt. Alm Clay, MD  ECHOCARDIOGRAM COMPLETE Result Date: 04/23/2023    ECHOCARDIOGRAM REPORT   Patient Name:   [name] Date of Exam: 04/23/2023 Medical Rec #:  [id number]    Height:       67.0 in Accession #:    [id number]   Weight:       143.4 lb Date of Birth:  [birth year]     BSA:          1.756 m Patient Age:    52 years     BP:           142/98 mmHg Patient Gender: M            HR:           67 bpm. Exam Location:  Inpatient Procedure: 2D Echo, Cardiac Doppler and Color Doppler Indications:    Chest Pain R07.9  History:        Patient has prior history of Echocardiogram examinations, most                 recent 11/22/2018.  Sonographer:    Tinnie Gosling RDCS Referring Phys: [id number] XIKA ZHAO IMPRESSIONS  1. Left ventricular ejection fraction, by estimation, is 55 to 60%. The left ventricle has normal function. The left ventricle has no regional wall motion abnormalities. Left ventricular  diastolic parameters are consistent with Grade I diastolic dysfunction (impaired relaxation).  2. Right ventricular systolic function is normal. The right ventricular size is normal.  3. The mitral valve is normal in structure. No evidence of mitral valve regurgitation. No evidence of mitral stenosis.  4. The aortic valve is tricuspid. Aortic valve regurgitation is not visualized. No aortic stenosis is present.  5. The inferior vena cava is normal in size with greater than 50% respiratory variability, suggesting right atrial pressure of 3 mmHg. FINDINGS  Left Ventricle: Left ventricular ejection fraction, by estimation, is 55 to 60%. The left ventricle has normal function. The left ventricle has no regional wall motion abnormalities. The left ventricular internal cavity size was normal in size. There is  no left ventricular hypertrophy. Left ventricular diastolic parameters are consistent with Grade I diastolic dysfunction (impaired relaxation). Normal left ventricular filling pressure. Right Ventricle: The right ventricular size is normal. No increase in right ventricular wall thickness. Right ventricular systolic function is normal. Left Atrium: Left atrial size was normal in size. Right Atrium: Right atrial size was normal in size. Pericardium: There is no evidence of pericardial effusion. Mitral Valve: The mitral valve is normal in structure. No evidence of mitral valve regurgitation. No evidence of mitral valve stenosis. Tricuspid Valve: The tricuspid valve is normal in structure. Tricuspid valve regurgitation is not demonstrated. No evidence of tricuspid stenosis. Aortic Valve: The aortic valve is tricuspid. Aortic valve regurgitation is not visualized. No aortic stenosis is present. Pulmonic Valve: The pulmonic valve was normal in structure. Pulmonic valve regurgitation is trivial. No evidence of pulmonic stenosis. Aorta: The aortic root, ascending aorta and aortic arch are all structurally normal, with no  evidence of dilitation or obstruction. Venous: The inferior vena cava is normal in size with greater than 50% respiratory variability, suggesting right atrial pressure of 3 mmHg. IAS/Shunts: No atrial level shunt detected by color flow Doppler.  LEFT VENTRICLE PLAX 2D LVIDd:         5.30 cm   Diastology LVIDs:         3.40 cm  LV e' medial:    8.38 cm/s LV PW:         1.00 cm   LV E/e' medial:  8.6 LV IVS:        1.00 cm   LV e' lateral:   11.40 cm/s LVOT diam:     2.20 cm   LV E/e' lateral: 6.4 LV SV:         87 LV SV Index:   50 LVOT Area:     3.80 cm  RIGHT VENTRICLE             IVC RV S prime:     12.10 cm/s  IVC diam: 1.80 cm TAPSE (M-mode): 1.7 cm LEFT ATRIUM             Index        RIGHT ATRIUM          Index LA diam:        3.20 cm 1.82 cm/m   RA Area:     9.68 cm LA Vol (A2C):   37.7 ml 21.47 ml/m  RA Volume:   20.50 ml 11.68 ml/m LA Vol (A4C):   28.8 ml 16.40 ml/m LA Biplane Vol: 35.0 ml 19.94 ml/m  AORTIC VALVE LVOT Vmax:   118.00 cm/s LVOT Vmean:  78.900 cm/s LVOT VTI:    0.230 m  AORTA Ao Root diam: 3.00 cm Ao Asc diam:  3.20 cm MITRAL VALVE MV Area (PHT): 2.66 cm    SHUNTS MV Decel Time: 285 msec    Systemic VTI:  0.23 m MV E velocity: 72.40 cm/s  Systemic Diam: 2.20 cm MV A velocity: 76.10 cm/s MV E/A ratio:  0.95 Annabella Scarce MD Electronically signed by Annabella Scarce MD Signature Date/Time: 04/23/2023/5:06:01 PM    Final    DG Chest Port 1 View Result Date: 04/23/2023 CLINICAL DATA:  Chest pain EXAM: PORTABLE CHEST 1 VIEW COMPARISON:  CT 10/06/2022 FINDINGS: Normal heart size. No pleural effusion. Mild diffuse increase interstitial markings identified bilaterally. No consolidative change. Previous ORIF of the left clavicle. IMPRESSION: Mild diffuse increase interstitial markings bilaterally. Differential considerations include chronic interstitial lung disease versus mild interstitial edema. Electronically Signed   By: Waddell Calk M.D.   On: 04/23/2023 11:33     Microbiology: Results for orders placed or performed during the hospital encounter of 04/23/23  MRSA Next Gen by PCR, Nasal     Status: None   Collection Time: 04/23/23 10:15 PM   Specimen: Nasal Mucosa; Nasal Swab  Result Value Ref Range Status   MRSA by PCR Next Gen NOT DETECTED NOT DETECTED Final    Comment: (NOTE) The GeneXpert MRSA Assay (FDA approved for NASAL specimens only), is one component of a comprehensive MRSA colonization surveillance program. It is not intended to diagnose MRSA infection nor to guide or monitor treatment for MRSA infections. Test performance is not FDA approved in patients less than 49 years old. Performed at [facility], [address], [place], [place] [address]     Labs: CBC: Recent Labs  Lab 04/23/23 1004 04/24/23 0235  WBC 16.5* 13.9*  HGB 14.4 14.3  HCT 41.8 41.3  MCV 95.4 94.9  PLT 330 317   Basic Metabolic Panel: Recent Labs  Lab 04/23/23 1004 04/23/23 1209 04/23/23 1236 04/24/23 0235  NA 136 136  --  137  K 4.1 4.8  --  3.7  CL 104 103  --  105  CO2 22 23  --  24  GLUCOSE 106* 106*  --  101*  BUN 15 16  --  18  CREATININE 0.64 0.62  --  0.60*  CALCIUM  9.6 9.5  --  8.9  MG  --   --  2.3  --    Liver Function Tests: Recent Labs  Lab 04/23/23 1004  AST 20  ALT 17  ALKPHOS 50  BILITOT 0.4  PROT 6.4*  ALBUMIN 3.7   CBG: No results for input(s): GLUCAP in the last 168 hours.  Discharge time spent: greater than 30 minutes.  Signed: Kynsley Whitehouse, DO [facility] 04/24/2023

## 2023-04-24 NOTE — H&P (View-Only) (Signed)
 Progress Note  Patient Name: [name] Date of Encounter: 04/24/2023  Primary Cardiologist: Maude Emmer, MD   Subjective   Patient seen and examined at his bedside. Aware of his heart cath todays  Inpatient Medications    Scheduled Meds:  aspirin  EC  81 mg Oral Daily   atorvastatin   80 mg Oral Daily   carvedilol   3.125 mg Oral BID WC   nicotine   21 mg Transdermal Daily   pantoprazole   40 mg Oral Daily   predniSONE   40 mg Oral Q breakfast   Continuous Infusions:  sodium chloride  40 mL/hr at 04/24/23 0703   heparin  1,000 Units/hr (04/23/23 1855)   nitroGLYCERIN  30 mcg/min (04/23/23 1900)   PRN Meds: acetaminophen , morphine  injection, ondansetron  (ZOFRAN ) IV   Vital Signs    Vitals:   04/23/23 2006 04/23/23 2300 04/24/23 0233 04/24/23 0748  BP: (!) 117/98 109/76 121/81 112/87  Pulse: 88 69 72 80  Resp: 20 14 16 15   Temp: 98.3 F (36.8 C) 98 F (36.7 C) 97.9 F (36.6 C) 97.9 F (36.6 C)  TempSrc: Oral Oral Oral Oral  SpO2:  95% 97% 97%  Weight: 64.5 kg     Height: 5' 9 (1.753 m)       Intake/Output Summary (Last 24 hours) at 04/24/2023 9178 Last data filed at 04/24/2023 0747 Gross per 24 hour  Intake 304.2 ml  Output 875 ml  Net -570.8 ml   Filed Weights   04/23/23 0954 04/23/23 2006  Weight: 65 kg 64.5 kg    Telemetry    Sinus rhythm - Personally Reviewed  ECG     - Personally Reviewed  Physical Exam    General: Comfortable Head: Atraumatic, normal size  Eyes: PEERLA, EOMI  Neck: Supple, normal JVD Cardiac: Normal S1, S2; RRR; no murmurs, rubs, or gallops Lungs: Clear to auscultation bilaterally Abd: Soft, nontender, no hepatomegaly  Ext: warm, no edema Musculoskeletal: No deformities, BUE and BLE strength normal and equal Skin: Warm and dry, no rashes   Neuro: Alert and oriented to person, place, time, and situation, CNII-XII grossly intact, no focal deficits  Psych: Normal mood and affect   Labs    Chemistry Recent Labs   Lab 04/23/23 1004 04/23/23 1209 04/24/23 0235  NA 136 136 137  K 4.1 4.8 3.7  CL 104 103 105  CO2 22 23 24   GLUCOSE 106* 106* 101*  BUN 15 16 18   CREATININE 0.64 0.62 0.60*  CALCIUM  9.6 9.5 8.9  PROT 6.4*  --   --   ALBUMIN 3.7  --   --   AST 20  --   --   ALT 17  --   --   ALKPHOS 50  --   --   BILITOT 0.4  --   --   GFRNONAA >60 >60 >60  ANIONGAP 10 10 8      Hematology Recent Labs  Lab 04/23/23 1004 04/24/23 0235  WBC 16.5* 13.9*  RBC 4.38 4.35  HGB 14.4 14.3  HCT 41.8 41.3  MCV 95.4 94.9  MCH 32.9 32.9  MCHC 34.4 34.6  RDW 13.5 13.6  PLT 330 317    Cardiac EnzymesNo results for input(s): TROPONINI in the last 168 hours. No results for input(s): TROPIPOC in the last 168 hours.   BNPNo results for input(s): BNP, PROBNP in the last 168 hours.   DDimer No results for input(s): DDIMER in the last 168 hours.   Radiology    ECHOCARDIOGRAM COMPLETE Result  Date: 04/23/2023    ECHOCARDIOGRAM REPORT   Patient Name:   [name] Date of Exam: 04/23/2023 Medical Rec #:  [id number]    Height:       67.0 in Accession #:    [id number]   Weight:       143.4 lb Date of Birth:  February 24, 1971     BSA:          1.756 m Patient Age:    52 years     BP:           142/98 mmHg Patient Gender: M            HR:           67 bpm. Exam Location:  Inpatient Procedure: 2D Echo, Cardiac Doppler and Color Doppler Indications:    Chest Pain R07.9  History:        Patient has prior history of Echocardiogram examinations, most                 recent 11/22/2018.  Sonographer:    Tinnie Gosling RDCS Referring Phys: [id number] XIKA ZHAO IMPRESSIONS  1. Left ventricular ejection fraction, by estimation, is 55 to 60%. The left ventricle has normal function. The left ventricle has no regional wall motion abnormalities. Left ventricular diastolic parameters are consistent with Grade I diastolic dysfunction (impaired relaxation).  2. Right ventricular systolic function is normal. The right ventricular  size is normal.  3. The mitral valve is normal in structure. No evidence of mitral valve regurgitation. No evidence of mitral stenosis.  4. The aortic valve is tricuspid. Aortic valve regurgitation is not visualized. No aortic stenosis is present.  5. The inferior vena cava is normal in size with greater than 50% respiratory variability, suggesting right atrial pressure of 3 mmHg. FINDINGS  Left Ventricle: Left ventricular ejection fraction, by estimation, is 55 to 60%. The left ventricle has normal function. The left ventricle has no regional wall motion abnormalities. The left ventricular internal cavity size was normal in size. There is  no left ventricular hypertrophy. Left ventricular diastolic parameters are consistent with Grade I diastolic dysfunction (impaired relaxation). Normal left ventricular filling pressure. Right Ventricle: The right ventricular size is normal. No increase in right ventricular wall thickness. Right ventricular systolic function is normal. Left Atrium: Left atrial size was normal in size. Right Atrium: Right atrial size was normal in size. Pericardium: There is no evidence of pericardial effusion. Mitral Valve: The mitral valve is normal in structure. No evidence of mitral valve regurgitation. No evidence of mitral valve stenosis. Tricuspid Valve: The tricuspid valve is normal in structure. Tricuspid valve regurgitation is not demonstrated. No evidence of tricuspid stenosis. Aortic Valve: The aortic valve is tricuspid. Aortic valve regurgitation is not visualized. No aortic stenosis is present. Pulmonic Valve: The pulmonic valve was normal in structure. Pulmonic valve regurgitation is trivial. No evidence of pulmonic stenosis. Aorta: The aortic root, ascending aorta and aortic arch are all structurally normal, with no evidence of dilitation or obstruction. Venous: The inferior vena cava is normal in size with greater than 50% respiratory variability, suggesting right atrial pressure  of 3 mmHg. IAS/Shunts: No atrial level shunt detected by color flow Doppler.  LEFT VENTRICLE PLAX 2D LVIDd:         5.30 cm   Diastology LVIDs:         3.40 cm   LV e' medial:    8.38 cm/s LV PW:  1.00 cm   LV E/e' medial:  8.6 LV IVS:        1.00 cm   LV e' lateral:   11.40 cm/s LVOT diam:     2.20 cm   LV E/e' lateral: 6.4 LV SV:         87 LV SV Index:   50 LVOT Area:     3.80 cm  RIGHT VENTRICLE             IVC RV S prime:     12.10 cm/s  IVC diam: 1.80 cm TAPSE (M-mode): 1.7 cm LEFT ATRIUM             Index        RIGHT ATRIUM          Index LA diam:        3.20 cm 1.82 cm/m   RA Area:     9.68 cm LA Vol (A2C):   37.7 ml 21.47 ml/m  RA Volume:   20.50 ml 11.68 ml/m LA Vol (A4C):   28.8 ml 16.40 ml/m LA Biplane Vol: 35.0 ml 19.94 ml/m  AORTIC VALVE LVOT Vmax:   118.00 cm/s LVOT Vmean:  78.900 cm/s LVOT VTI:    0.230 m  AORTA Ao Root diam: 3.00 cm Ao Asc diam:  3.20 cm MITRAL VALVE MV Area (PHT): 2.66 cm    SHUNTS MV Decel Time: 285 msec    Systemic VTI:  0.23 m MV E velocity: 72.40 cm/s  Systemic Diam: 2.20 cm MV A velocity: 76.10 cm/s MV E/A ratio:  0.95 Annabella Scarce MD Electronically signed by Annabella Scarce MD Signature Date/Time: 04/23/2023/5:06:01 PM    Final    DG Chest Port 1 View Result Date: 04/23/2023 CLINICAL DATA:  Chest pain EXAM: PORTABLE CHEST 1 VIEW COMPARISON:  CT 10/06/2022 FINDINGS: Normal heart size. No pleural effusion. Mild diffuse increase interstitial markings identified bilaterally. No consolidative change. Previous ORIF of the left clavicle. IMPRESSION: Mild diffuse increase interstitial markings bilaterally. Differential considerations include chronic interstitial lung disease versus mild interstitial edema. Electronically Signed   By: Waddell Calk M.D.   On: 04/23/2023 11:33    Cardiac Studies   Echo normal  Patient Profile     52 y.o. male presents with NSTEMI  Assessment & Plan    NSTEMI Tobacco use  Alcohol abuse GERD  Anxiety   He is  schedule for LHC today, previous cath in 2019 reviewed.  Symptoms has resolved with heparin  and nitroglycerin .  Echo with normal EF.  Continue Heparin  gtt, ASA 81mg  daily, lipitor 40mg  daily, and coreg  3.125mg  BID. Smoking cessation advised.  Further recommendations post cardiac cath.     Informed Consent   Shared Decision Making/Informed Consent The risks [stroke (1 in 1000), death (1 in 1000), kidney failure [usually temporary] (1 in 500), bleeding (1 in 200), allergic reaction [possibly serious] (1 in 200)], benefits (diagnostic support and management of coronary artery disease) and alternatives of a cardiac catheterization were discussed in detail with Mr. Clopper and he is willing to proceed.     For questions or updates, please contact [facility] Please consult [email or web address] for contact info under Cardiology/STEMI.      Signed, Lakenya Riendeau, DO  04/24/2023, 8:21 AM

## 2023-04-26 ENCOUNTER — Encounter (HOSPITAL_COMMUNITY): Payer: Self-pay | Admitting: Cardiology

## 2023-05-01 ENCOUNTER — Inpatient Hospital Stay: Payer: BC Managed Care – PPO | Admitting: Family Medicine

## 2023-05-02 ENCOUNTER — Ambulatory Visit (INDEPENDENT_AMBULATORY_CARE_PROVIDER_SITE_OTHER): Payer: BC Managed Care – PPO | Admitting: Family Medicine

## 2023-05-02 ENCOUNTER — Encounter: Payer: Self-pay | Admitting: Family Medicine

## 2023-05-02 VITALS — BP 136/72 | HR 99 | Temp 98.7°F | Ht 69.0 in | Wt 151.2 lb

## 2023-05-02 DIAGNOSIS — F1090 Alcohol use, unspecified, uncomplicated: Secondary | ICD-10-CM

## 2023-05-02 DIAGNOSIS — F17200 Nicotine dependence, unspecified, uncomplicated: Secondary | ICD-10-CM

## 2023-05-02 DIAGNOSIS — I214 Non-ST elevation (NSTEMI) myocardial infarction: Secondary | ICD-10-CM | POA: Diagnosis not present

## 2023-05-02 DIAGNOSIS — I25118 Atherosclerotic heart disease of native coronary artery with other forms of angina pectoris: Secondary | ICD-10-CM | POA: Diagnosis not present

## 2023-05-02 DIAGNOSIS — E785 Hyperlipidemia, unspecified: Secondary | ICD-10-CM

## 2023-05-02 DIAGNOSIS — I2510 Atherosclerotic heart disease of native coronary artery without angina pectoris: Secondary | ICD-10-CM | POA: Insufficient documentation

## 2023-05-02 DIAGNOSIS — L309 Dermatitis, unspecified: Secondary | ICD-10-CM

## 2023-05-02 MED ORDER — ATORVASTATIN CALCIUM 40 MG PO TABS: 40.0000 mg | ORAL_TABLET | Freq: Every day | ORAL | 3 refills | Status: DC

## 2023-05-02 MED ORDER — CARVEDILOL 3.125 MG PO TABS: 3.1250 mg | ORAL_TABLET | Freq: Two times a day (BID) | ORAL | 3 refills | Status: DC

## 2023-05-02 NOTE — Assessment & Plan Note (Addendum)
 Discussed recent hospitalization with lab confirmed non-ST elevation MI (heart attack). Discussed possible etiologies of plaque rupture vs CA spasm.   Recommend continue atorva and aspirin  81mg  daily, given no obstructive CAD found on heart catheterization, reasonable to try lower atorvastatin  dose 40mg  daily. Recommend goal LDL <70 however. Discussed plavix  for next few months, then may reassess symptoms/cholesterol control.  Discussed possible coronary artery spasm causing symptoms - would consider CCB if that were the case.

## 2023-05-02 NOTE — Patient Instructions (Addendum)
 Take atorvastatin  40mg  daily - sent to pharmacy. Take carvedilol  3.125mg  twice daily.  Continue aspirin  81mg  daily.  Take plavix  for at least next few months.  Return in 3 months with fasting labs and we will review medicines at that time.  Let me know if any new symptoms develop.   Call this skin doctor office to ask for appointment: [facility]  [address] [place] [address]  [facility] [phone]

## 2023-05-02 NOTE — Assessment & Plan Note (Signed)
 Continue statin, aspirin, plavix and carvedilol

## 2023-05-02 NOTE — Assessment & Plan Note (Signed)
 Encouraged ongoing cessation attempts. He is working on cutting down tobacco use.

## 2023-05-02 NOTE — Assessment & Plan Note (Addendum)
 Recurring rash now off prednisone (last dose was yesterday).  Still awaiting scheduling - new referral sent to [facility] 04/19/2023 as [facility] did not have availability until May 2025.

## 2023-05-02 NOTE — Assessment & Plan Note (Addendum)
 Recommend continue atorvastatin  at 40mg  daily, new dose sent to pharmacy.  The ASCVD Risk score (Arnett DK, et al., 2019) failed to calculate for the following reasons:   Risk score cannot be calculated because patient has a medical history suggesting prior/existing ASCVD

## 2023-05-02 NOTE — Progress Notes (Signed)
 Ph: [phone] Fax: [phone]   Patient ID: [name], male    DOB: 02-21-1971, 53 y.o.   MRN: [id number]  This visit was conducted in person.  BP 136/72   Pulse 99   Temp 98.7 F (37.1 C) (Oral)   Ht 5' 9 (1.753 m)   Wt 151 lb 4 oz (68.6 kg)   SpO2 96%   BMI 22.34 kg/m    CC: hosp f/u visit  Subjective:   HPI: [name] is a 53 y.o. male presenting on 05/02/2023 for Hospitalization Follow-up (Admitted on 04/23/23 at [facility], dx NSTEMI. )   Recent hospitalization for substernal chest pain with associated jaw tightness and sweating, diagnosed with NSTEMI based on increased troponin  I to peak 1697. Treated with heparin  drip, nitroglycerin  drip, aspirin  started along with lipitor and carvedilol .  Hospital records reviewed.  Med rec performed.  L heart catheterization 04/24/2023 - minimal CAD found. No lesion to explain elevated troponins/NSTEMI. Did have 35 % stenosis to mid RCA. Started on plavix  x6 months - ?symptoms due to plaque rupture.   Has been feeling well since he's been home. Hesitant to take all these new medicines I don't want to be on pills all my life.   He was recently prescribed prednisone  60mg  taper for severe eczema pending dermatology evaluation. This significantly helped.  Continued smoker 1 ppd x40 yrs - cutting down. Does continue dip.  Continued alcohol use - has cut down to mainly weekend drinking. No more vodka.   Home health not set up.  Other follow up appointments scheduled: none ______________________________________________________________________ Hospital admission: 04/23/2023 Hospital discharge: 04/24/2023 TCM f/u phone call: not performed   Recommendations at discharge:   Follow up with PCP   Discharge Diagnoses: Principal Problem:   NSTEMI (non-ST elevated myocardial infarction) (HCC)     Relevant past medical, surgical, family and social history reviewed and updated as indicated. Interim medical history since our last  visit reviewed. Allergies and medications reviewed and updated. Outpatient Medications Prior to Visit  Medication Sig Dispense Refill   aspirin  EC 81 MG tablet Take 1 tablet (81 mg total) by mouth daily. Swallow whole. 30 tablet 12   clopidogrel  (PLAVIX ) 75 MG tablet Take 1 tablet (75 mg total) by mouth daily with breakfast. 181 tablet 0   EPINEPHrine  0.3 mg/0.3 mL IJ SOAJ injection Inject 0.3 mg into the muscle daily as needed. 2 each 0   Fluocinonide  0.1 % CREA Apply a small amount to affected area twice a day if needed.  Don't use on the face. 60 g 0   hydrOXYzine  (ATARAX ) 25 MG tablet Take 1 tablet (25 mg total) by mouth 2 (two) times daily as needed for itching (sedation precautions). (Patient taking differently: Take 25 mg by mouth at bedtime as needed for itching (sedation precautions).) 40 tablet 0   predniSONE  (DELTASONE ) 20 MG tablet Take 3 a day for 5 days, then 2 a day for 5 days, then 1 a day for 5 days with food. Don't take with aleve /ibuprofen . 30 tablet 0   atorvastatin  (LIPITOR) 80 MG tablet Take 1 tablet (80 mg total) by mouth daily. 90 tablet 0   carvedilol  (COREG ) 3.125 MG tablet Take 1 tablet (3.125 mg total) by mouth 2 (two) times daily with a meal. 180 tablet 0   No facility-administered medications prior to visit.     Per HPI unless specifically indicated in ROS section below Review of Systems  Objective:  BP 136/72  Pulse 99   Temp 98.7 F (37.1 C) (Oral)   Ht 5' 9 (1.753 m)   Wt 151 lb 4 oz (68.6 kg)   SpO2 96%   BMI 22.34 kg/m   Wt Readings from Last 3 Encounters:  05/02/23 151 lb 4 oz (68.6 kg)  04/23/23 142 lb 3.2 oz (64.5 kg)  04/17/23 143 lb 6.4 oz (65 kg)      Physical Exam Vitals and nursing note reviewed.  Constitutional:      Appearance: Normal appearance. He is not ill-appearing.  HENT:     Mouth/Throat:     Mouth: Mucous membranes are moist.     Pharynx: Oropharynx is clear. No oropharyngeal exudate or posterior oropharyngeal erythema.   Eyes:     Extraocular Movements: Extraocular movements intact.     Conjunctiva/sclera: Conjunctivae normal.     Pupils: Pupils are equal, round, and reactive to light.  Cardiovascular:     Rate and Rhythm: Normal rate and regular rhythm.     Pulses: Normal pulses.     Heart sounds: Normal heart sounds. No murmur heard. Pulmonary:     Effort: Pulmonary effort is normal. No respiratory distress.     Breath sounds: Normal breath sounds. No wheezing, rhonchi or rales.  Musculoskeletal:     Right lower leg: No edema.     Left lower leg: No edema.  Skin:    Findings: Rash present.     Comments: Recurring pruritic rash to neck, arms, legs, trunk  Neurological:     Mental Status: He is alert.       Lab Results  Component Value Date   HGBA1C 5.6 04/23/2023    Lab Results  Component Value Date   NA 137 04/24/2023   CL 105 04/24/2023   K 3.7 04/24/2023   CO2 24 04/24/2023   BUN 18 04/24/2023   CREATININE 0.60 (L) 04/24/2023   GFRNONAA >60 04/24/2023   CALCIUM  8.9 04/24/2023   ALBUMIN 3.7 04/23/2023   GLUCOSE 101 (H) 04/24/2023    Lab Results  Component Value Date   ALT 17 04/23/2023   AST 20 04/23/2023   ALKPHOS 50 04/23/2023   BILITOT 0.4 04/23/2023    Lab Results  Component Value Date   CHOL 267 (H) 04/23/2023   HDL 85 04/23/2023   LDLCALC 172 (H) 04/23/2023   TRIG 51 04/23/2023   CHOLHDL 3.1 04/23/2023    Assessment & Plan:   Problem List Items Addressed This Visit     Habitual alcohol use   Encouraged ongoing attempts to cut down alcohol.       Smoker   Encouraged ongoing cessation attempts. He is working on cutting down tobacco use.       HLD (hyperlipidemia)   Recommend continue atorvastatin  at 40mg  daily, new dose sent to pharmacy.  The ASCVD Risk score (Arnett DK, et al., 2019) failed to calculate for the following reasons:   Risk score cannot be calculated because patient has a medical history suggesting prior/existing ASCVD       Relevant  Medications   atorvastatin  (LIPITOR) 40 MG tablet   carvedilol  (COREG ) 3.125 MG tablet   Eczematous dermatitis   Recurring rash now off prednisone  (last dose was yesterday).  Still awaiting scheduling - new referral sent to [facility] 04/19/2023 as [facility] did not have availability until May 2025.       NSTEMI (non-ST elevated myocardial infarction) (HCC) - Primary   Discussed recent hospitalization with lab confirmed non-ST elevation MI (  heart attack). Discussed possible etiologies of plaque rupture vs CA spasm.   Recommend continue atorva and aspirin  81mg  daily, given no obstructive CAD found on heart catheterization, reasonable to try lower atorvastatin  dose 40mg  daily. Recommend goal LDL <70 however. Discussed plavix  for next few months, then may reassess symptoms/cholesterol control.  Discussed possible coronary artery spasm causing symptoms - would consider CCB if that were the case.       Relevant Medications   atorvastatin  (LIPITOR) 40 MG tablet   carvedilol  (COREG ) 3.125 MG tablet   CAD (coronary artery disease)   Continue statin, aspirin , plavix  and carvedilol       Relevant Medications   atorvastatin  (LIPITOR) 40 MG tablet   carvedilol  (COREG ) 3.125 MG tablet     Meds ordered this encounter  Medications   atorvastatin  (LIPITOR) 40 MG tablet    Sig: Take 1 tablet (40 mg total) by mouth daily.    Dispense:  90 tablet    Refill:  3    Note new dose   carvedilol  (COREG ) 3.125 MG tablet    Sig: Take 1 tablet (3.125 mg total) by mouth 2 (two) times daily with a meal.    Dispense:  180 tablet    Refill:  3    No orders of the defined types were placed in this encounter.   Patient Instructions  Take atorvastatin  40mg  daily - sent to pharmacy. Take carvedilol  3.125mg  twice daily.  Continue aspirin  81mg  daily.  Take plavix  for at least next few months.  Return in 3 months with fasting labs and we will review medicines at that time.  Let me know if any new  symptoms develop.   Call this skin doctor office to ask for appointment: [facility]  [address] [place] [address]  [facility] [phone]   Follow up plan: Return in about 3 months (around 07/31/2023) for follow up visit.  Anton Blas, MD

## 2023-05-02 NOTE — Assessment & Plan Note (Signed)
 Encouraged ongoing attempts to cut down alcohol.

## 2023-05-07 DIAGNOSIS — R21 Rash and other nonspecific skin eruption: Secondary | ICD-10-CM | POA: Diagnosis not present

## 2023-05-07 DIAGNOSIS — L279 Dermatitis due to unspecified substance taken internally: Secondary | ICD-10-CM | POA: Diagnosis not present

## 2023-05-07 DIAGNOSIS — L2989 Other pruritus: Secondary | ICD-10-CM | POA: Diagnosis not present

## 2023-05-09 ENCOUNTER — Emergency Department (HOSPITAL_COMMUNITY): Payer: BC Managed Care – PPO

## 2023-05-09 ENCOUNTER — Emergency Department (HOSPITAL_COMMUNITY)
Admission: EM | Admit: 2023-05-09 | Discharge: 2023-05-10 | Disposition: A | Discharge status: Home / Self Care | Payer: BC Managed Care – PPO | Attending: Emergency Medicine | Admitting: Emergency Medicine

## 2023-05-09 ENCOUNTER — Other Ambulatory Visit: Payer: Self-pay

## 2023-05-09 ENCOUNTER — Encounter (HOSPITAL_COMMUNITY): Payer: Self-pay

## 2023-05-09 DIAGNOSIS — J449 Chronic obstructive pulmonary disease, unspecified: Secondary | ICD-10-CM | POA: Diagnosis not present

## 2023-05-09 DIAGNOSIS — Z955 Presence of coronary angioplasty implant and graft: Secondary | ICD-10-CM | POA: Diagnosis not present

## 2023-05-09 DIAGNOSIS — R1110 Vomiting, unspecified: Secondary | ICD-10-CM | POA: Insufficient documentation

## 2023-05-09 DIAGNOSIS — R21 Rash and other nonspecific skin eruption: Secondary | ICD-10-CM | POA: Diagnosis not present

## 2023-05-09 DIAGNOSIS — L509 Urticaria, unspecified: Secondary | ICD-10-CM | POA: Diagnosis not present

## 2023-05-09 DIAGNOSIS — R0789 Other chest pain: Secondary | ICD-10-CM | POA: Diagnosis not present

## 2023-05-09 DIAGNOSIS — T7840XA Allergy, unspecified, initial encounter: Secondary | ICD-10-CM | POA: Diagnosis not present

## 2023-05-09 DIAGNOSIS — T782XXA Anaphylactic shock, unspecified, initial encounter: Secondary | ICD-10-CM | POA: Diagnosis not present

## 2023-05-09 DIAGNOSIS — R064 Hyperventilation: Secondary | ICD-10-CM | POA: Diagnosis not present

## 2023-05-09 DIAGNOSIS — F17210 Nicotine dependence, cigarettes, uncomplicated: Secondary | ICD-10-CM | POA: Insufficient documentation

## 2023-05-09 DIAGNOSIS — R0602 Shortness of breath: Secondary | ICD-10-CM | POA: Insufficient documentation

## 2023-05-09 DIAGNOSIS — I10 Essential (primary) hypertension: Secondary | ICD-10-CM | POA: Diagnosis not present

## 2023-05-09 DIAGNOSIS — M799 Soft tissue disorder, unspecified: Secondary | ICD-10-CM | POA: Diagnosis not present

## 2023-05-09 MED ORDER — FAMOTIDINE IN NACL 20-0.9 MG/50ML-% IV SOLN
20.0000 mg | Freq: Once | INTRAVENOUS | Status: AC
  Administered 2023-05-09: 20 mg via INTRAVENOUS
  Filled 2023-05-09: qty 50

## 2023-05-09 MED ORDER — DIPHENHYDRAMINE HCL 50 MG/ML IJ SOLN
25.0000 mg | Freq: Once | INTRAMUSCULAR | Status: AC
  Administered 2023-05-09: 25 mg via INTRAVENOUS
  Filled 2023-05-09: qty 1

## 2023-05-09 MED ORDER — ONDANSETRON HCL 4 MG/2ML IJ SOLN
4.0000 mg | Freq: Once | INTRAMUSCULAR | Status: AC
  Administered 2023-05-09: 4 mg via INTRAVENOUS
  Filled 2023-05-09: qty 2

## 2023-05-09 MED ORDER — SODIUM CHLORIDE 0.9 % IV BOLUS
1000.0000 mL | Freq: Once | INTRAVENOUS | Status: AC
  Administered 2023-05-09: 1000 mL via INTRAVENOUS

## 2023-05-09 MED ORDER — METHYLPREDNISOLONE SODIUM SUCC 125 MG IJ SOLR
125.0000 mg | Freq: Once | INTRAMUSCULAR | Status: AC
  Administered 2023-05-09: 125 mg via INTRAVENOUS
  Filled 2023-05-09: qty 2

## 2023-05-09 NOTE — ED Triage Notes (Signed)
 Patient BIB [facility] from home due to a rash. Patient has noted redness around neck and chest. Patient states this has been going on for "days and days". Patient states it was previously tolerable and tonight it was worse. Patient describes it as burning and itching. Patient A&Ox4.
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
## 2023-05-10 ENCOUNTER — Emergency Department (HOSPITAL_COMMUNITY): Payer: BC Managed Care – PPO

## 2023-05-10 DIAGNOSIS — R21 Rash and other nonspecific skin eruption: Secondary | ICD-10-CM | POA: Diagnosis not present

## 2023-05-10 DIAGNOSIS — R0602 Shortness of breath: Secondary | ICD-10-CM | POA: Diagnosis not present

## 2023-05-10 DIAGNOSIS — M799 Soft tissue disorder, unspecified: Secondary | ICD-10-CM | POA: Diagnosis not present

## 2023-05-10 LAB — COMPREHENSIVE METABOLIC PANEL
ALT: 37 U/L (ref 0–44)
AST: 29 U/L (ref 15–41)
Albumin: 3.5 g/dL (ref 3.5–5.0)
Alkaline Phosphatase: 53 U/L (ref 38–126)
Anion gap: 9 (ref 5–15)
BUN: 16 mg/dL (ref 6–20)
CO2: 23 mmol/L (ref 22–32)
Calcium: 9.2 mg/dL (ref 8.9–10.3)
Chloride: 107 mmol/L (ref 98–111)
Creatinine, Ser: 0.78 mg/dL (ref 0.61–1.24)
GFR, Estimated: >60 mL/min (ref >60)
Glucose, Bld: 97 mg/dL (ref 70–99)
Potassium: 3.9 mmol/L (ref 3.5–5.1)
Sodium: 139 mmol/L (ref 135–145)
Total Bilirubin: 0.3 mg/dL (ref 0.0–1.2)
Total Protein: 6 g/dL — ABNORMAL LOW (ref 6.5–8.1)

## 2023-05-10 LAB — CBC
HCT: 43.5 % (ref 39.0–52.0)
Hemoglobin: 14.9 g/dL (ref 13.0–17.0)
MCH: 33 pg (ref 26.0–34.0)
MCHC: 34.3 g/dL (ref 30.0–36.0)
MCV: 96.2 fL (ref 80.0–100.0)
Platelets: 355 10*3/uL (ref 150–400)
RBC: 4.52 MIL/uL (ref 4.22–5.81)
RDW: 14.1 % (ref 11.5–15.5)
WBC: 14.4 10*3/uL — ABNORMAL HIGH (ref 4.0–10.5)
nRBC: 0 % (ref 0.0–0.2)

## 2023-05-10 LAB — PROTIME-INR
INR: 0.9 (ref 0.8–1.2)
Prothrombin Time: 12.1 s (ref 11.4–15.2)

## 2023-05-10 MED ORDER — IOHEXOL 350 MG/ML SOLN
75.0000 mL | Freq: Once | INTRAVENOUS | Status: AC | PRN
  Administered 2023-05-10: 75 mL via INTRAVENOUS

## 2023-05-10 MED ORDER — HYDROCORTISONE 1 % EX CREA
TOPICAL_CREAM | Freq: Once | CUTANEOUS | Status: AC
  Filled 2023-05-10: qty 28

## 2023-05-10 MED ORDER — DOXYCYCLINE HYCLATE 100 MG PO CAPS: 100.0000 mg | ORAL_CAPSULE | Freq: Two times a day (BID) | ORAL | 0 refills | Status: AC

## 2023-05-10 NOTE — ED Notes (Signed)
Patient ambulated to bathroom with no assistance.

## 2023-05-10 NOTE — Discharge Instructions (Signed)
You were evaluated in the Emergency Department and after careful evaluation, we did not find any emergent condition requiring admission or further testing in the hospital.  Your exam/testing today is overall reassuring.  Recommend continued follow-up with your dermatologist.  Your rash may be secondarily infected.  Take the doxycycline antibiotic as prescribed.  Recommend hydrocortisone cream.  Please return to the Emergency Department if you experience any worsening of your condition.   Thank you for allowing [place] to be a part of your care.

## 2023-05-12 ENCOUNTER — Encounter (HOSPITAL_COMMUNITY): Payer: Self-pay

## 2023-05-12 ENCOUNTER — Emergency Department (HOSPITAL_COMMUNITY)
Admission: EM | Admit: 2023-05-12 | Discharge: 2023-05-12 | Disposition: A | Discharge status: Home / Self Care | Payer: BC Managed Care – PPO | Attending: Emergency Medicine | Admitting: Emergency Medicine

## 2023-05-12 ENCOUNTER — Other Ambulatory Visit: Payer: Self-pay

## 2023-05-12 DIAGNOSIS — I2510 Atherosclerotic heart disease of native coronary artery without angina pectoris: Secondary | ICD-10-CM | POA: Diagnosis not present

## 2023-05-12 DIAGNOSIS — Z7982 Long term (current) use of aspirin: Secondary | ICD-10-CM | POA: Insufficient documentation

## 2023-05-12 DIAGNOSIS — L509 Urticaria, unspecified: Secondary | ICD-10-CM | POA: Insufficient documentation

## 2023-05-12 DIAGNOSIS — J449 Chronic obstructive pulmonary disease, unspecified: Secondary | ICD-10-CM | POA: Diagnosis not present

## 2023-05-12 DIAGNOSIS — Z7901 Long term (current) use of anticoagulants: Secondary | ICD-10-CM | POA: Diagnosis not present

## 2023-05-12 DIAGNOSIS — R21 Rash and other nonspecific skin eruption: Secondary | ICD-10-CM

## 2023-05-12 DIAGNOSIS — Z79899 Other long term (current) drug therapy: Secondary | ICD-10-CM | POA: Insufficient documentation

## 2023-05-12 DIAGNOSIS — I10 Essential (primary) hypertension: Secondary | ICD-10-CM | POA: Diagnosis not present

## 2023-05-12 LAB — BASIC METABOLIC PANEL
Anion gap: 12 (ref 5–15)
BUN: 13 mg/dL (ref 6–20)
CO2: 22 mmol/L (ref 22–32)
Calcium: 9.7 mg/dL (ref 8.9–10.3)
Chloride: 101 mmol/L (ref 98–111)
Creatinine, Ser: 0.75 mg/dL (ref 0.61–1.24)
GFR, Estimated: >60 mL/min (ref >60)
Glucose, Bld: 86 mg/dL (ref 70–99)
Potassium: 3.9 mmol/L (ref 3.5–5.1)
Sodium: 135 mmol/L (ref 135–145)

## 2023-05-12 LAB — CBC WITH DIFFERENTIAL/PLATELET
Abs Immature Granulocytes: 0.2 10*3/uL — ABNORMAL HIGH (ref 0.00–0.07)
Basophils Absolute: 0.1 10*3/uL (ref 0.0–0.1)
Basophils Relative: 1 %
Eosinophils Absolute: 1.9 10*3/uL — ABNORMAL HIGH (ref 0.0–0.5)
Eosinophils Relative: 12 %
HCT: 51.2 % (ref 39.0–52.0)
Hemoglobin: 17.9 g/dL — ABNORMAL HIGH (ref 13.0–17.0)
Immature Granulocytes: 1 %
Lymphocytes Relative: 14 %
Lymphs Abs: 2.3 10*3/uL (ref 0.7–4.0)
MCH: 33.5 pg (ref 26.0–34.0)
MCHC: 35 g/dL (ref 30.0–36.0)
MCV: 95.9 fL (ref 80.0–100.0)
Monocytes Absolute: 0.8 10*3/uL (ref 0.1–1.0)
Monocytes Relative: 5 %
Neutro Abs: 10.9 10*3/uL — ABNORMAL HIGH (ref 1.7–7.7)
Neutrophils Relative %: 67 %
Platelets: 381 10*3/uL (ref 150–400)
RBC: 5.34 MIL/uL (ref 4.22–5.81)
RDW: 14.1 % (ref 11.5–15.5)
WBC: 16.2 10*3/uL — ABNORMAL HIGH (ref 4.0–10.5)
nRBC: 0 % (ref 0.0–0.2)

## 2023-05-12 LAB — HEPATIC FUNCTION PANEL
ALT: 29 U/L (ref 0–44)
AST: 26 U/L (ref 15–41)
Albumin: 4.2 g/dL (ref 3.5–5.0)
Alkaline Phosphatase: 71 U/L (ref 38–126)
Bilirubin, Direct: 0.2 mg/dL (ref 0.0–0.2)
Indirect Bilirubin: 0.7 mg/dL (ref 0.3–0.9)
Total Bilirubin: 0.9 mg/dL (ref 0.0–1.2)
Total Protein: 7.6 g/dL (ref 6.5–8.1)

## 2023-05-12 LAB — HIV ANTIBODY (ROUTINE TESTING W REFLEX): HIV Screen 4th Generation wRfx: NONREACTIVE

## 2023-05-12 MED ORDER — DIPHENHYDRAMINE HCL 50 MG/ML IJ SOLN
50.0000 mg | Freq: Once | INTRAMUSCULAR | Status: AC
  Administered 2023-05-12: 50 mg via INTRAVENOUS
  Filled 2023-05-12: qty 1

## 2023-05-12 MED ORDER — SODIUM CHLORIDE 0.9 % IV BOLUS
1000.0000 mL | Freq: Once | INTRAVENOUS | Status: AC
  Administered 2023-05-12: 1000 mL via INTRAVENOUS

## 2023-05-12 MED ORDER — METHYLPREDNISOLONE SODIUM SUCC 125 MG IJ SOLR
125.0000 mg | Freq: Once | INTRAMUSCULAR | Status: AC
  Administered 2023-05-12: 125 mg via INTRAVENOUS
  Filled 2023-05-12: qty 2

## 2023-05-12 MED ORDER — HYDROXYZINE HCL 25 MG PO TABS: 25.0000 mg | ORAL_TABLET | Freq: Three times a day (TID) | ORAL | 0 refills | Status: DC | PRN

## 2023-05-12 MED ORDER — PREDNISONE 50 MG PO TABS: 50.0000 mg | ORAL_TABLET | Freq: Every day | ORAL | 0 refills | Status: AC

## 2023-05-12 MED ORDER — TRIAMCINOLONE ACETONIDE 0.1 % EX CREA: 1.0000 | TOPICAL_CREAM | Freq: Two times a day (BID) | CUTANEOUS | 0 refills | Status: DC

## 2023-05-12 MED ORDER — FAMOTIDINE 20 MG PO TABS
20.0000 mg | ORAL_TABLET | Freq: Once | ORAL | Status: AC
  Administered 2023-05-12: 20 mg via ORAL
  Filled 2023-05-12: qty 1

## 2023-05-12 MED ORDER — EPINEPHRINE 0.3 MG/0.3ML IJ SOAJ: 0.3000 mg | Freq: Every day | INTRAMUSCULAR | 0 refills | Status: AC | PRN

## 2023-05-12 NOTE — ED Notes (Signed)
Pt c/o dizziness, states he feels like he is going to pass out, pt diaphoretic, assisted into stretcher by staff x 2.

## 2023-05-12 NOTE — ED Provider Triage Note (Signed)
Emergency Medicine Provider Triage Evaluation Note  [name] , a 53 y.o. male  was evaluated in triage.  Pt complains of rash to neck, back, abdomen, legs that has been occurring for "months" but worsened today. He reports rash became more erythematous and pruritic this morning so took 50 mg benadryl but did not improve so sought eval in ED. he does not know of precipitating factor or new thing that he used or ingested today.  Was seen on 05/09/2023 for similar presentation.  Was given Benadryl and Solu-Medrol with improvement of symptoms.  He was discharged with doxycycline.  He reports he is taken 2 days of doxycycline with no improvement of rash.  Review of Systems  Positive: Rash Negative: Fever  Physical Exam  BP 119/85   Pulse 78   Temp (!) 97.4 F (36.3 C) (Oral)   Resp (!) 22   Ht 5\' 9"  (1.753 m)   Wt 63.5 kg   SpO2 94%   BMI 20.67 kg/m  Gen:   Awake, no distress   Resp:  Normal effort  MSK:   Moves extremities without difficulty  Other:  Very erythematous rash around neck without welts or boils.  Urticaria with welts and erythematous rash to back, abdomen, extremities. Diaphoretic and anxious appearing.  Medical Decision Making  Medically screening exam initiated at 12:25 PM.  Appropriate orders placed.  MUDASSIR SUNGA was informed that the remainder of the evaluation will be completed by another provider, this initial triage assessment does not replace that evaluation, and the importance of remaining in the ED until their evaluation is complete.  Basic labs and Benadryl, Solu-Medrol, pepcid ordered. Dr. Clarice Pole informed and individually assessed patient.   Judithann Sheen, PA 05/12/23 1231

## 2023-05-12 NOTE — ED Provider Notes (Signed)
[place] EMERGENCY DEPARTMENT AT [facility] Provider Note   CSN: [id number] Arrival date & time: 05/12/23  1153     History  Chief Complaint  Patient presents with   Rash    [name] is a 53 y.o. male.   Rash 53 year old history of hypertension, COPD, CAD with recent NSTEMI, eczema presenting for rash.  He has had a rash for about a month.  Initially appeared as eczema on the right side of his neck and his back of his knees and elbows.  He was admitted on December 30 for NSTEMI.  While there he was given a steroid course for presumed eczema.  He was seen again on the 15th of this month due to worsening rash.  He had a CT scan of his neck at the time and was placed on doxycycline for possible cellulitis.  He states he finished oral steroids a little over a week ago and rash has become more severe since then.  He now has more like hives.  He had rash that appears like hives all over his chest, back, over his face and arms and legs.  The exam is component has actually improved somewhat.  He did earlier for like he had some trouble swallowing but no intraoral swelling or rash.  No difficulty breathing or chest pain.  He is currently using some hydroxyzine and topical hydrocortisone at home.     Home Medications Prior to Admission medications   Medication Sig Start Date End Date Taking? Authorizing Provider  hydrOXYzine (ATARAX) 25 MG tablet Take 1 tablet (25 mg total) by mouth every 8 (eight) hours as needed for itching. 05/12/23  Yes Laurence Spates, MD  predniSONE (DELTASONE) 50 MG tablet Take 1 tablet (50 mg total) by mouth daily for 7 days. 05/12/23 05/19/23 Yes Laurence Spates, MD  triamcinolone cream (KENALOG) 0.1 % Apply 1 Application topically 2 (two) times daily. Can use as needed twice daily over the affected areas.  Do not use over the face. 05/12/23  Yes Laurence Spates, MD  aspirin EC 81 MG tablet Take 1 tablet (81 mg total) by mouth daily. Swallow whole.  04/25/23   Dezii, Alexandra, DO  atorvastatin (LIPITOR) 40 MG tablet Take 1 tablet (40 mg total) by mouth daily. 05/02/23 07/31/23  Eustaquio Boyden, MD  carvedilol (COREG) 3.125 MG tablet Take 1 tablet (3.125 mg total) by mouth 2 (two) times daily with a meal. 05/02/23 07/31/23  Eustaquio Boyden, MD  clopidogrel (PLAVIX) 75 MG tablet Take 1 tablet (75 mg total) by mouth daily with breakfast. 04/25/23 10/23/23  Dezii, Gordy Councilman, DO  doxycycline (VIBRAMYCIN) 100 MG capsule Take 1 capsule (100 mg total) by mouth 2 (two) times daily for 10 days. 05/10/23 05/20/23  Sabas Sous, MD  EPINEPHrine 0.3 mg/0.3 mL IJ SOAJ injection Inject 0.3 mg into the muscle daily as needed. 05/12/23   Laurence Spates, MD  Fluocinonide 0.1 % CREA Apply a small amount to affected area twice a day if needed.  Don't use on the face. 04/17/23   Joaquim Nam, MD      Allergies    Bee venom, Celexa [citalopram hydrobromide], Oxycodone-acetaminophen, and Pristiq [desvenlafaxine succinate er]    Review of Systems   Review of Systems  Skin:  Positive for rash.  Review of systems completed and notable as per HPI.  ROS otherwise negative.   Physical Exam Updated Vital Signs BP (!) 132/91   Pulse 85   Temp  98.3 F (36.8 C) (Oral)   Resp 17   Ht 5\' 9"  (1.753 m)   Wt 63.5 kg   SpO2 100%   BMI 20.67 kg/m  Physical Exam Vitals and nursing note reviewed.  Constitutional:      General: He is not in acute distress.    Appearance: He is well-developed.  HENT:     Head: Normocephalic and atraumatic.     Nose: Nose normal.     Mouth/Throat:     Mouth: Mucous membranes are moist.     Pharynx: Oropharynx is clear.  Eyes:     Extraocular Movements: Extraocular movements intact.     Conjunctiva/sclera: Conjunctivae normal.     Pupils: Pupils are equal, round, and reactive to light.  Cardiovascular:     Rate and Rhythm: Normal rate and regular rhythm.     Pulses: Normal pulses.     Heart sounds: Normal heart sounds. No  murmur heard. Pulmonary:     Effort: Pulmonary effort is normal. No respiratory distress.     Breath sounds: Normal breath sounds.  Abdominal:     Palpations: Abdomen is soft.     Tenderness: There is no abdominal tenderness.  Musculoskeletal:        General: No swelling.     Cervical back: Neck supple.     Right lower leg: No edema.     Left lower leg: No edema.  Skin:    General: Skin is warm and dry.     Capillary Refill: Capillary refill takes less than 2 seconds.     Findings: Rash present.     Comments: Diffuse rash.  He has got areas of erythema with some morbilliform component and some urticarial component.  Minimal areas of eczematous rash.  He has rash over the face, chest, back and extremities.  No induration or fluctuance.  No vesicular component or bullous lesions.  No mucosal lesions.  No areas of significant swelling or facial edema.  Neurological:     General: No focal deficit present.     Mental Status: He is alert and oriented to person, place, and time. Mental status is at baseline.  Psychiatric:        Mood and Affect: Mood normal.     ED Results / Procedures / Treatments   Labs (all labs ordered are listed, but only abnormal results are displayed) Labs Reviewed  CBC WITH DIFFERENTIAL/PLATELET - Abnormal; Notable for the following components:      Result Value   WBC 16.2 (*)    Hemoglobin 17.9 (*)    Neutro Abs 10.9 (*)    Eosinophils Absolute 1.9 (*)    Abs Immature Granulocytes 0.20 (*)    All other components within normal limits  BASIC METABOLIC PANEL  HEPATIC FUNCTION PANEL  LYME DISEASE SEROLOGY W/REFLEX  RPR  HIV ANTIBODY (ROUTINE TESTING W REFLEX)  GC/CHLAMYDIA PROBE AMP ([facility]) NOT AT [facility]    EKG None  Radiology No results found.  Procedures Procedures    Medications Ordered in ED Medications  diphenhydrAMINE (BENADRYL) injection 50 mg (50 mg Intravenous Given 05/12/23 1227)  methylPREDNISolone sodium succinate (SOLU-MEDROL)  125 mg/2 mL injection 125 mg (125 mg Intravenous Given 05/12/23 1226)  sodium chloride 0.9 % bolus 1,000 mL (1,000 mLs Intravenous New Bag/Given 05/12/23 1357)  famotidine (PEPCID) tablet 20 mg (20 mg Oral Given 05/12/23 1355)    ED Course/ Medical Decision Making/ A&P  Medical Decision Making Amount and/or Complexity of Data Reviewed Labs: ordered.  Risk Prescription drug management.   Medical Decision Making:   BAPTISTE ODENS is a 53 y.o. male who presented to the ED today with rash.  Vital signs reviewed.  On exam he is quite itchy and uncomfortable and he has diffuse rash.  He has areas of eczema, but the rash he is here for is been present for about a month but is worsened over the last 10 days or so after stopping oral steroids.  He already has dermatology appointment scheduled for Tuesday.  He endorsed some trouble swallowing earlier today but this has resolved and is not having any trouble swallowing.  No intraoral swelling or lesions.  He did recently start Plavix, Coreg but no high risk medications for dress or SJS.  I have lower suspicion for SJS given no mucosal lesions.  Rectal temperature was obtained which did not show any signs of fever.  No change in detergents, recent insect bites or other exposures.   Patient placed on continuous vitals and telemetry monitoring while in ED which was reviewed periodically.  Reviewed and confirmed nursing documentation for past medical history, family history, social history.  Reassessment and Plan:   Lab work is notable for leukocytosis slightly increased from a few days ago.  His hemoglobin is slightly high, but not extremely so.  He has mild neutrophilia and eosinophilia.  Given his eosinophilia I spoke with Dr. Adriana Simas with dermatology at [facility].  I reviewed patient's history, recent medications and presentation today.  She feels like this is less likely an acute drug reaction and does not feel like patient  needs to be transferred.  Recommend starting triamcinolone topical as well as hydroxyzine for itching and restarting systemic steroids.  Patient already has follow-up with dermatology with [facility] on Tuesday and she is feels like he can reasonably follow-up with them.  I had a lengthy discussion with patient and his sister at bedside.  They did discuss Lyme disease testing as well as STI testing which is ordered.  I did discuss that this would not return her to have low suspicion for Lyme, syphilis at this time he is already on doxycycline for possible cellulitis which will cover Lyme as well.  I do not see any signs of sepsis at this time.  When I reexamined him I feel like his rash is significantly improved although not completely resolved.  He is had no signs of anaphylaxis here.  I did give him prescription for epinephrine pen and discussed indications for use of this.  I given prescription for triamcinolone, he already has hydrocortisone for his face.  Also given prescription for prednisone and hydroxyzine.  I discussed the plan including plan for close follow-up with dermatology on Tuesday.  He is comfortable with this plan.  I given strict return precautions.   Patient's presentation is most consistent with acute complicated illness / injury requiring diagnostic workup.           Final Clinical Impression(s) / ED Diagnoses Final diagnoses:  Rash    Rx / DC Orders ED Discharge Orders          Ordered    predniSONE (DELTASONE) 50 MG tablet  Daily        05/12/23 1525    hydrOXYzine (ATARAX) 25 MG tablet  Every 8 hours PRN        05/12/23 1525    triamcinolone cream (KENALOG) 0.1 %  2 times daily  05/12/23 1525    EPINEPHrine 0.3 mg/0.3 mL IJ SOAJ injection  Daily PRN        05/12/23 1526              Laurence Spates, MD 05/12/23 1547

## 2023-05-12 NOTE — ED Triage Notes (Signed)
Pt has red, raised areas on neck, face, stomach, abdomen. Pt states he has had rash for months but it has worsened today. Pt reports difficulty swallowing. Pt denies shortness of breath. Pt states he has swelling around eyes.

## 2023-05-12 NOTE — Discharge Instructions (Signed)
You were seen today for rash.  Your lab work is notable for slightly elevated white blood cell count.  I discussed your case with the [facility] dermatologist.  They recommended we restart you on oral steroids, and start a stronger topical steroid.  Do not use this on the face as it may cause issues with the skin on your face.  You can take the hydroxyzine as needed for itching as well.  You should follow-up with dermatology as scheduled on Tuesday and your primary care doctor.  If you develop chest pain, fever, difficulty breathing, worsening rash, severe swelling, or swelling in your mouth you should return to the ED.

## 2023-05-13 LAB — RPR: RPR Ser Ql: NONREACTIVE

## 2023-05-14 LAB — LYME DISEASE SEROLOGY W/REFLEX: Lyme Total Antibody EIA: NEGATIVE

## 2023-05-14 LAB — GC/CHLAMYDIA PROBE AMP (~~LOC~~) NOT AT ARMC
Chlamydia: NEGATIVE
Comment: NEGATIVE
Comment: NORMAL
Neisseria Gonorrhea: NEGATIVE

## 2023-05-15 ENCOUNTER — Ambulatory Visit (INDEPENDENT_AMBULATORY_CARE_PROVIDER_SITE_OTHER): Payer: BC Managed Care – PPO | Admitting: Dermatology

## 2023-05-15 ENCOUNTER — Encounter: Payer: Self-pay | Admitting: Dermatology

## 2023-05-15 VITALS — BP 143/90

## 2023-05-15 DIAGNOSIS — L308 Other specified dermatitis: Secondary | ICD-10-CM | POA: Diagnosis not present

## 2023-05-15 DIAGNOSIS — R21 Rash and other nonspecific skin eruption: Secondary | ICD-10-CM

## 2023-05-15 DIAGNOSIS — L299 Pruritus, unspecified: Secondary | ICD-10-CM

## 2023-05-15 MED ORDER — TRIAMCINOLONE ACETONIDE 0.1 % EX CREA: 1.0000 | TOPICAL_CREAM | Freq: Two times a day (BID) | CUTANEOUS | 1 refills | Status: DC

## 2023-05-15 MED ORDER — TACROLIMUS 0.1 % EX OINT: TOPICAL_OINTMENT | Freq: Two times a day (BID) | CUTANEOUS | 1 refills | Status: DC

## 2023-05-15 MED ORDER — PREDNISONE 10 MG PO TABS: ORAL_TABLET | ORAL | 0 refills | Status: AC

## 2023-05-15 NOTE — Patient Instructions (Addendum)
Hello Mr. [name],  Thank you for visiting my office today. Your dedication to addressing your health concerns and working towards improving your condition is greatly appreciated.  Here is a summary of the key instructions and next steps from today's consultation:  Medications Prescribed:   Pepcid: Continue taking daily for histamine-related itching.   Prednisone Taper: Take 10 mg tablets as follows: four tablets for 4 days, three tablets for 3 days, two tablets for 4 days, and one tablet for 4 days.   Triamcinolone Cream: Apply twice a day for 2 weeks.   After 2 weeks, switch to Tacrolimus Ointment mixed with CeraVe Anti-Itch. Apply twice a day for another 2 weeks.  Laboratory Tests and Biopsies:   A full laboratory workup is required, including tests for anti-Jo-1, anti-Mi-2, , creatinine kinase, LDH, CMP, ANA with titers, aldolase, anti-TIF1-?, and anti-MDA5 antibodies.   We performed a punch biopsy on the right upper arm and a perilesional biopsy to rule out urticarial bullous pemphigoid vs a drug reaction vs dermatomyositis.     Post-Biopsy Care:   Keep the biopsy areas clean; allow them to get wet in the shower, then towel dry. Apply Aquaphor Healing Ointment and cover with a Band-Aid daily for the next 2 weeks until suture removal.  Follow-Up Appointment:   Please return in 2 weeks for suture removal and to discuss the results of your lab work and biopsies.  Ensure to follow these instructions carefully and do not hesitate to reach out if you have any questions or concerns. I look forward to seeing you in two weeks to further assess your progress.  Warm regards,  Dr. Langston Reusing, Dermatology     Patient Handout: Wound Care for Skin Biopsy Site  Taking Care of Your Skin Biopsy Site  Proper care of the biopsy site is essential for promoting healing and minimizing scarring. This handout provides instructions on how to care for your biopsy site to ensure optimal  recovery.  1. Cleaning the Wound:  Clean the biopsy site daily with gentle soap and water. Gently pat the area dry with a clean, soft towel. Avoid harsh scrubbing or rubbing the area, as this can irritate the skin and delay healing.  2. Applying Aquaphor and Bandage:  After cleaning the wound, apply a thin layer of Aquaphor ointment to the biopsy site. Cover the area with a sterile bandage to protect it from dirt, bacteria, and friction. Change the bandage daily or as needed if it becomes soiled or wet.  3. Continued Care for One Week:  Repeat the cleaning, Aquaphor application, and bandaging process daily for one week following the biopsy procedure. Keeping the wound clean and moist during this initial healing period will help prevent infection and promote optimal healing.  4. Massaging Aquaphor into the Area:  ---After one week, discontinue the use of bandages but continue to apply Aquaphor to the biopsy site. ----Gently massage the Aquaphor into the area using circular motions. ---Massaging the skin helps to promote circulation and prevent the formation of scar tissue.   Additional Tips:  Avoid exposing the biopsy site to direct sunlight during the healing process, as this can cause hyperpigmentation or worsen scarring. If you experience any signs of infection, such as increased redness, swelling, warmth, or drainage from the wound, contact your healthcare provider immediately. Follow any additional instructions provided by your healthcare provider for caring for the biopsy site and managing any discomfort. Conclusion:  Taking proper care of your skin biopsy site is  crucial for ensuring optimal healing and minimizing scarring. By following these instructions for cleaning, applying Aquaphor, and massaging the area, you can promote a smooth and successful recovery. If you have any questions or concerns about caring for your biopsy site, don't hesitate to contact your healthcare  provider for guidance.     Important Information  Due to recent changes in healthcare laws, you may see results of your pathology and/or laboratory studies on MyChart before the doctors have had a chance to review them. We understand that in some cases there may be results that are confusing or concerning to you. Please understand that not all results are received at the same time and often the doctors may need to interpret multiple results in order to provide you with the best plan of care or course of treatment. Therefore, we ask that you please give [place] 2 business days to thoroughly review all your results before contacting the office for clarification. Should we see a critical lab result, you will be contacted sooner.   If You Need Anything After Your Visit  If you have any questions or concerns for your doctor, please call our main line at [phone] If no one answers, please leave a voicemail as directed and we will return your call as soon as possible. Messages left after 4 pm will be answered the following business day.   You may also send [place] a message via MyChart. We typically respond to MyChart messages within 1-2 business days.  For prescription refills, please ask your pharmacy to contact our office. Our fax number is [phone].  If you have an urgent issue when the clinic is closed that cannot wait until the next business day, you can page your doctor at the number below.    Please note that while we do our best to be available for urgent issues outside of office hours, we are not available 24/7.   If you have an urgent issue and are unable to reach [place], you may choose to seek medical care at your doctor's office, retail clinic, urgent care center, or emergency room.  If you have a medical emergency, please immediately call 911 or go to the emergency department. In the event of inclement weather, please call our main line at [phone] for an update on the status of any  delays or closures.  Dermatology Medication Tips: Please keep the boxes that topical medications come in in order to help keep track of the instructions about where and how to use these. Pharmacies typically print the medication instructions only on the boxes and not directly on the medication tubes.   If your medication is too expensive, please contact our office at [phone] or send [place] a message through MyChart.   We are unable to tell what your co-pay for medications will be in advance as this is different depending on your insurance coverage. However, we may be able to find a substitute medication at lower cost or fill out paperwork to get insurance to cover a needed medication.   If a prior authorization is required to get your medication covered by your insurance company, please allow [place] 1-2 business days to complete this process.  Drug prices often vary depending on where the prescription is filled and some pharmacies may offer cheaper prices.  The website www.goodrx.com contains coupons for medications through different pharmacies. The prices here do not account for what the cost may be with help from insurance (it may be cheaper with your insurance),  but the website can give you the price if you did not use any insurance.  - You can print the associated coupon and take it with your prescription to the pharmacy.  - You may also stop by our office during regular business hours and pick up a GoodRx coupon card.  - If you need your prescription sent electronically to a different pharmacy, notify our office through [facility] or by phone at [phone]

## 2023-05-15 NOTE — Progress Notes (Signed)
New Patient Visit   Subjective  [name] is a 53 y.o. male who presents for the following: Rash that started about 3 months ago on his arms and ankles. 3 weeks ago he had a mild heart attack and the rash "exploded" all over after he started new medications (atorvastatin, carvedilol, plavix, aspirin). It is very itchy and burns. He took a 7 day course of prednisone and that helped but the rash came right back. He take hydroxyzine 25 mg every morning. He used TMC 0.1% cream until he ran out of it and it helped with the itch. He has also used permethrin cram and fluocinonide in the past but neither helped. He does not have a history of eczema.    The following portions of the chart were reviewed this encounter and updated as appropriate: medications, allergies, medical history  Review of Systems:  No other skin or systemic complaints except as noted in HPI or Assessment and Plan.  Objective  Well appearing patient in no apparent distress; mood and affect are within normal limits.   A focused examination was performed of the following areas:   Relevant exam findings are noted in the Assessment and Plan.    Assessment & Plan   Rash Assessment: Patient presents with a progressive rash that started in November, initially on ankles and shins, now spreading to abdomen, flanks, arms, legs, and neck. Clinical examination reveals urticarial pink papules coalescing into plaques, eczematous-like scaly plaques in the antecubital and popliteal fossas, erythema around the neck, and erythema and swelling around both eyelids (possible heliotrope sign). The rash has been unresponsive to previous treatments including prednisone, hydroxyzine, and scabies cream. Recent onset of new medications (Lipitor, methadone, aspirin, Coreg, and Plavix) following a mild heart attack three weeks ago coincides with worsening of the rash. Differential diagnoses include autoimmune condition, drug reaction, or medication  reaction. Further investigation is required to determine the etiology. Pt's records from ED and PCP and labs were reviewed in detail  Plan:   Perform three skin biopsies: punch biopsy on right upper arm, perilesional biopsy to rule out urticarial bullous pemphigoid vs drug hypersensitivity reaction vs other, and biopsy on a newer lesion.    Check Labs:  workup including anti-Jo-1, anti-Mi-2, , creatinine kinase, lactic dehydrogenase (LDH), CMP, ANA with titers, aldolase,  to rule out dermatomyositis, drug reaction, bacterial BP, and eczema.    Initiate prednisone taper: 10 mg tablets, four for 4 days, three for 3 days, two for 4 days, and one for 4 days.    Prescribe triamcinolone cream, applied twice daily for 2 weeks.    After 2 weeks, transition to tacrolimus ointment mixed with CeraVe Anti-Itch lotion, applied twice daily for 2 weeks.    Continue daily Pepcid for histamine-related itching.   Recommend CeraVe Anti-Itch moisturizer for daily use.    Provide wound care instructions: keep biopsy areas clean, wet in shower, towel dry, apply Aquaphor Healing Ointment and Band-Aid daily for 2 weeks.    Schedule follow-up appointment in 2 weeks for suture removal, lab work review, and biopsy results.   Check PSA levels.     RASH Left upper arm, Left upper arm perilesional, Right back Skin / nail biopsy - Left upper arm Type of biopsy: punch   Informed consent: discussed and consent obtained   Timeout: patient name, date of birth, surgical site, and procedure verified   Procedure prep:  Patient was prepped and draped in usual sterile fashion (the patient was cleaned  and prepped) Prep type:  Isopropyl alcohol Anesthesia: the lesion was anesthetized in a standard fashion   Anesthetic:  1% lidocaine w/ epinephrine 1-100,000 buffered w/ 8.4% NaHCO3 Punch size:  4 mm Suture size:  3-0 Suture type: nylon   Hemostasis achieved with: suture, pressure and aluminum chloride   Outcome: patient  tolerated procedure well   Post-procedure details: sterile dressing applied and wound care instructions given   Dressing type: bandage, petrolatum and pressure dressing    Skin / nail biopsy - Right back Type of biopsy: punch   Informed consent: discussed and consent obtained   Timeout: patient name, date of birth, surgical site, and procedure verified   Procedure prep:  Patient was prepped and draped in usual sterile fashion (the patient was cleaned and prepped) Prep type:  Isopropyl alcohol Anesthesia: the lesion was anesthetized in a standard fashion   Anesthetic:  1% lidocaine w/ epinephrine 1-100,000 buffered w/ 8.4% NaHCO3 Punch size:  4 mm Suture size:  3-0 Suture type: nylon   Hemostasis achieved with: suture, pressure and aluminum chloride   Outcome: patient tolerated procedure well   Post-procedure details: sterile dressing applied and wound care instructions given   Dressing type: bandage, petrolatum and pressure dressing    Skin / nail biopsy - Left upper arm perilesional Type of biopsy: punch   Informed consent: discussed and consent obtained   Timeout: patient name, date of birth, surgical site, and procedure verified   Procedure prep:  Patient was prepped and draped in usual sterile fashion (the patient was cleaned and prepped) Prep type:  Isopropyl alcohol Anesthesia: the lesion was anesthetized in a standard fashion   Anesthetic:  1% lidocaine w/ epinephrine 1-100,000 buffered w/ 8.4% NaHCO3 Punch size:  4 mm Suture size:  3-0 Suture type: nylon   Hemostasis achieved with: suture, pressure and aluminum chloride   Outcome: patient tolerated procedure well   Post-procedure details: sterile dressing applied and wound care instructions given   Dressing type: bandage, petrolatum and pressure dressing   Specimen 1 - Surgical pathology Differential Diagnosis:  Dermatomyocytis vs Bullous Pemphigoid vs Eczema vs Drug Reaction  Check Margins: No  Specimen 2 - Surgical  pathology Differential Diagnosis:  Dermatomyocytis vs Bullous Pemphigoid vs Eczema vs Drug Reaction  Check Margins: No  Specimen 3 - Surgical pathology Differential Diagnosis: Dermatomyocytis vs Bullous Pemphigoid vs Eczema vs Drug Reaction Perilesional - DIF Check Margins: No  Return in about 2 weeks (around 05/29/2023) for Biopsy Follow up.  I, Joanie Coddington, CMA, am acting as scribe for [employer], DO .   Documentation: I have reviewed the above documentation for accuracy and completeness, and I agree with the above.  Langston Reusing, DO

## 2023-05-16 DIAGNOSIS — R21 Rash and other nonspecific skin eruption: Secondary | ICD-10-CM | POA: Diagnosis not present

## 2023-05-16 LAB — SURGICAL PATHOLOGY

## 2023-05-18 LAB — COMPREHENSIVE METABOLIC PANEL
ALT: 23 [IU]/L (ref 0–44)
AST: 20 [IU]/L (ref 0–40)
Albumin: 4.5 g/dL (ref 3.8–4.9)
Alkaline Phosphatase: 85 [IU]/L (ref 44–121)
BUN/Creatinine Ratio: 44 — ABNORMAL HIGH (ref 9–20)
BUN: 27 mg/dL — ABNORMAL HIGH (ref 6–24)
Bilirubin Total: 0.3 mg/dL (ref 0.0–1.2)
CO2: 24 mmol/L (ref 20–29)
Calcium: 9.8 mg/dL (ref 8.7–10.2)
Chloride: 101 mmol/L (ref 96–106)
Creatinine, Ser: 0.61 mg/dL — ABNORMAL LOW (ref 0.76–1.27)
Globulin, Total: 2.3 g/dL (ref 1.5–4.5)
Glucose: 97 mg/dL (ref 70–99)
Potassium: 3.6 mmol/L (ref 3.5–5.2)
Sodium: 141 mmol/L (ref 134–144)
Total Protein: 6.8 g/dL (ref 6.0–8.5)
eGFR: 115 mL/min/{1.73_m2} (ref >59)

## 2023-05-18 LAB — CBC WITH DIFFERENTIAL/PLATELET
Basophils Absolute: 0.1 10*3/uL (ref 0.0–0.2)
Basos: 1 %
EOS (ABSOLUTE): 0.7 10*3/uL — ABNORMAL HIGH (ref 0.0–0.4)
Eos: 5 %
Hematocrit: 45.2 % (ref 37.5–51.0)
Hemoglobin: 15.7 g/dL (ref 13.0–17.7)
Immature Grans (Abs): 0.2 10*3/uL — ABNORMAL HIGH (ref 0.0–0.1)
Immature Granulocytes: 1 %
Lymphocytes Absolute: 2.4 10*3/uL (ref 0.7–3.1)
Lymphs: 17 %
MCH: 33.5 pg — ABNORMAL HIGH (ref 26.6–33.0)
MCHC: 34.7 g/dL (ref 31.5–35.7)
MCV: 96 fL (ref 79–97)
Monocytes Absolute: 1 10*3/uL — ABNORMAL HIGH (ref 0.1–0.9)
Monocytes: 8 %
Neutrophils Absolute: 9.6 10*3/uL — ABNORMAL HIGH (ref 1.4–7.0)
Neutrophils: 68 %
Platelets: 344 10*3/uL (ref 150–450)
RBC: 4.69 x10E6/uL (ref 4.14–5.80)
RDW: 12.4 % (ref 11.6–15.4)
WBC: 14 10*3/uL — ABNORMAL HIGH (ref 3.4–10.8)

## 2023-05-18 LAB — ALDOLASE: Aldolase: 6 U/L (ref 3.3–10.3)

## 2023-05-18 LAB — ANA W/RFX TO ALL IF POSITIVE: Anti Nuclear Antibody (ANA): NEGATIVE

## 2023-05-18 LAB — PSA: Prostate Specific Ag, Serum: 1.2 ng/mL (ref 0.0–4.0)

## 2023-05-18 LAB — ANTI-JO 1 ANTIBODY, IGG: Anti JO-1: <0.2 AI (ref 0.0–0.9)

## 2023-05-18 LAB — LACTATE DEHYDROGENASE: LDH: 245 IU/L — ABNORMAL HIGH (ref 121–224)

## 2023-05-30 ENCOUNTER — Encounter: Payer: Self-pay | Admitting: Dermatology

## 2023-05-30 ENCOUNTER — Ambulatory Visit: Payer: BC Managed Care – PPO | Admitting: Dermatology

## 2023-05-30 VITALS — BP 148/87

## 2023-05-30 DIAGNOSIS — L209 Atopic dermatitis, unspecified: Secondary | ICD-10-CM | POA: Diagnosis not present

## 2023-05-30 MED ORDER — DUPIXENT 300 MG/2ML ~~LOC~~ SOAJ: 300.0000 mg | SUBCUTANEOUS | 6 refills | Status: DC

## 2023-05-30 MED ORDER — TRIAMCINOLONE ACETONIDE 0.1 % EX OINT: 1.0000 | TOPICAL_OINTMENT | Freq: Two times a day (BID) | CUTANEOUS | 1 refills | Status: DC

## 2023-05-30 MED ORDER — DUPIXENT 300 MG/2ML ~~LOC~~ SOAJ: 600.0000 mg | Freq: Once | SUBCUTANEOUS | 0 refills | Status: AC

## 2023-05-30 MED ORDER — DUPILUMAB 300 MG/2ML ~~LOC~~ SOSY
600.0000 mg | PREFILLED_SYRINGE | Freq: Once | SUBCUTANEOUS | Status: AC
  Administered 2023-05-30: 600 mg via SUBCUTANEOUS

## 2023-05-30 NOTE — Patient Instructions (Addendum)

## 2023-05-30 NOTE — Progress Notes (Signed)
 Follow-Up Visit   Subjective  [name] is a 53 y.o. male who presents for the following: Biopsy follow up - Atopic dermatitis vs possible DRESS syndrome - he has enough prednisone  to last until the end of this week. He is not using clobetasol because it burns. The only topical cream he can use is Lanacane. His itching has not improved at all.    The following portions of the chart were reviewed this encounter and updated as appropriate: medications, allergies, medical history  Review of Systems:  No other skin or systemic complaints except as noted in HPI or Assessment and Plan.  Objective  Well appearing patient in no apparent distress; mood and affect are within normal limits.   A focused examination was performed of the following areas: Trunk and extremities   Relevant exam findings are noted in the Assessment and Plan.  Recent labs and bx results were reviewed in detail with pt.  FINAL DIAGNOSIS and MICROSCOPIC DESCRIPTION Diagnosis 1. Skin , left upper arm CHRONIC SPONGIOTIC DERMATITIS, SEE DESCRIPTION 2. Skin , right back CHRONIC SPONGIOTIC DERMATITIS, SEE DESCRIPTION 3. Direct Immunofluorescence, left upper arm perilesional NEGATIVE FOR IMMUNOREACTANTS Microscopic Description 1. There is acanthosis with foci of slight spongiosis and parakeratosis. An infiltrate composed predominantly of lymphocytes is present around the superficial vascular plexus. A PAS stain is negative for fungi. The findings are most consistent with a chronic eczematous dermatitis such as contact, nummular or atopic dermatitis. 2. There is acanthosis with foci of slight spongiosis and parakeratosis. An infiltrate composed predominantly of lymphocytes is present around the superficial vascular plexus. A PAS stain is negative for fungi. The findings are most consistent with a chronic eczematous dermatitis such as contact, nummular or atopic dermatitis. 3. The specimen is negative for  immunoreactants which include antisera against IgG, IgM, IgA, C3 component of complement and fibrinogen. The controls stained appropriately.  Component     Latest Ref Rng 05/12/2023  WBC     4.0 - 10.5 K/uL 16.2 (H)   RBC     4.22 - 5.81 MIL/uL 5.34   Hemoglobin     13.0 - 17.0 g/dL 82.0 (H)   HCT     60.9 - 52.0 % 51.2   MCV     80.0 - 100.0 fL 95.9   MCH     26.0 - 34.0 pg 33.5   MCHC     30.0 - 36.0 g/dL 64.9   RDW     88.4 - 84.4 % 14.1   Platelets     150 - 400 K/uL 381   nRBC     0.0 - 0.2 % 0.0   Neutrophils     % 67   NEUT#     1.7 - 7.7 K/uL 10.9 (H)   Lymphocytes     % 14   Lymphs Abs     0.7 - 4.0 K/uL 2.3   Monocytes Relative     % 5   Monocyte #     0.1 - 1.0 K/uL 0.8   Eosinophil     % 12   Eosinophils Absolute     0.0 - 0.5 K/uL 1.9 (H)   Basophil     % 1   Basophils Absolute     0.0 - 0.1 K/uL 0.1   Immature Granulocytes     % 1   Abs Immature Granulocytes     0.00 - 0.07 K/uL 0.20 (H)   Sodium     135 -  145 mmol/L 135   Potassium     3.5 - 5.1 mmol/L 3.9   Chloride     98 - 111 mmol/L 101   CO2     22 - 32 mmol/L 22   Glucose     70 - 99 mg/dL 86   BUN     6 - 20 mg/dL 13   Creatinine     9.38 - 1.24 mg/dL 9.24   Calcium      8.9 - 10.3 mg/dL 9.7   GFR, Estimated     >60 mL/min >60   Anion gap     5 - 15  12   Total Protein     6.5 - 8.1 g/dL 7.6   Albumin     3.5 - 5.0 g/dL 4.2   AST     15 - 41 U/L 26   ALT     0 - 44 U/L 29   Alkaline Phosphatase     38 - 126 U/L 71   Total Bilirubin     0.0 - 1.2 mg/dL 0.9   Bilirubin, Direct     0.0 - 0.2 mg/dL 0.2   Indirect Bilirubin     0.3 - 0.9 mg/dL 0.7   Neisseria Gonorrhea Negative   Chlamydia Negative   Comment Normal Reference Ranger Chlamydia - Negative   Comment Normal Reference Range Neisseria Gonorrhea - Negative   Lyme Total Antibody EIA     Negative  Negative   RPR     NON REACTIVE  NON REACTIVE   HIV Screen 4th Generation wRfx     Non Reactive  Non  Reactive     Legend: (H) High   Assessment & Plan   Atopic Dermatitis  Assessment: Patient presents with a widespread rash affecting 80% of body surface area, characterized by intense itching and burning. Biopsy results suggest atopic dermatitis or DRESS syndrome, Urticarial BP was ruled out with Bx (H&E and Diff). The condition has not adequately responded to oral prednisone . Exacerbation is noted with heat and sweating. Elevated white blood cell count and abnormal kidney function are observed. The rash predates a recent cardiac event and the initiation of a new medication regimen (Lipitor, Plavix , aspirin ), but worsened after starting these medications. Diagnosis leans towards atopic dermatitis based on clinical presentation and biopsy results.   Plan:   Initiate Dupixent  therapy with a loading dose of 600 mg (2 injections).   Discontinue oral prednisone .   Apply tacrolimus  ointment to the neck area.   Continue prescription-strength lanolin.   Temporarily discontinue Lipitor and Plavix  (no stent was placed during recent cardiac cath/angiogram).   Continue aspirin .   Apply triamcinolone  ointment 2-3 times daily.   Use CeraVe moisturizer (cream or oil) mixed with prescribed medication.   Recommend Aveeno oatmeal tub soaks for skin hydration.   Continue Benadryl  at night or switch to Zyrtec if preferred.   Continue Pepcid .   Follow-up in 2 weeks for the next Dupixent  injection and injection training.   Monitor for 10 minutes post-injection to ensure no adverse reaction.    Discussed Dupixent . Patient would like to start treatment with Dupixent .  Dupixent  loading dose injections given to bilateral upper arms today. NDC 9975-4084-79 Lot [id number] Exp 01/21/2025. Will plan to do injection training on follow up. Dupixent  [employer] paperwork faxed today and prescription sent to [facility].  Recommend gentle skin care.   ATOPIC DERMATITIS, UNSPECIFIED TYPE   Related Medications dupilumab   (DUPIXENT ) prefilled syringe 600 mg   Return  in about 2 weeks (around 06/13/2023) for follow up and Dupixent  injection.  I, Roseline Hutchinson, CMA, am acting as scribe for [employer], DO .   Documentation: I have reviewed the above documentation for accuracy and completeness, and I agree with the above.  Delon Lenis, DO

## 2023-06-13 ENCOUNTER — Ambulatory Visit: Payer: BC Managed Care – PPO | Admitting: Dermatology

## 2023-06-13 ENCOUNTER — Ambulatory Visit (INDEPENDENT_AMBULATORY_CARE_PROVIDER_SITE_OTHER): Payer: BC Managed Care – PPO | Admitting: Dermatology

## 2023-06-13 VITALS — BP 149/84

## 2023-06-13 DIAGNOSIS — L209 Atopic dermatitis, unspecified: Secondary | ICD-10-CM | POA: Diagnosis not present

## 2023-06-13 DIAGNOSIS — L299 Pruritus, unspecified: Secondary | ICD-10-CM

## 2023-06-13 NOTE — Progress Notes (Signed)
   Follow-Up Visit   Subjective  [name] is a 53 y.o. male who presents for the following: Injection of Dupixent and Eczema follow up Patient received injections at the last visit for severe atopic dermatitis. The patient reports significant improvement in symptoms, particularly noting that "everything's a lot better." The patient has been using Aveeno for moisturizing. Despite improvement, some residual symptoms persist, including itching and excoriations.  Current and Past Medications and Supplements - Dupixent (injections) - Aveeno (moisturizer) - CeraVe Anti-Itch - Aquaphor (spray)   The following portions of the chart were reviewed this encounter and updated as appropriate: medications, allergies, medical history  Review of Systems:  No other skin or systemic complaints except as noted in HPI or Assessment and Plan.  Objective  Well appearing patient in no apparent distress; mood and affect are within normal limits.   A focused examination was performed of the following areas: Trunk  Relevant exam findings are noted in the Assessment and Plan.    Assessment & Plan   ATOPIC DERMATITIS Exam: Erythema and scale are improved, still with some excoriations.  Improving  Assessment: Patient shows significant improvement in symptoms following previous Dupixent injections. Physical examination reveals improving erythema and scale, with some persistent excoriations. The current treatment regimen is effective, with potential for further improvement in skin hydration and itch control.   Plan:   Administer 2nd Dupixent injection during today's visit.   Provide injection training for home administration.   Continue Dupixent injections every 2 weeks.   Ensure pharmacy delivers Dupixent for home use within 2 weeks.   Modify skincare regimen:     Continue Aveeno baths.     Apply CeraVe Anti-Itch after bathing.     Follow with Aquaphor spray to lock in moisture.   Schedule  follow-up appointment in 3 months to assess progress.   Instruct patient to call for an emergency appointment if medication not received within 2 weeks ATOPIC DERMATITIS, UNSPECIFIED TYPE   PRURITUS    No follow-ups on file.  I, Joanie Coddington, CMA, am acting as scribe for [employer], DO .   Documentation: I have reviewed the above documentation for accuracy and completeness, and I agree with the above.  Langston Reusing, DO

## 2023-06-13 NOTE — Patient Instructions (Signed)
 Hello [name],  Thank you for visiting [place] today.  Here is a summary of the key instructions from today's consultation:  Dupixent Injections: Continue receiving Dupixent injections every 2 weeks. You received a sample injection and training today, and you will start administering them at home once they arrive by mail.   Pharmacy Coordination: Ensure to confirm your availability with the pharmacy each month for the delivery of your injections.  Skin Care Regimen:   Bathing: Continue using Aveeno.   Moisturizing: Apply CeraVe Anti-Itch lotion after your bath.   Moisture Lock: Use Aquaphor spray to lock in the moisture.  Injection Training: [name] has provided training on how to administer your injections at home.  Important Reminder: If you do not receive your injection delivery within the next two weeks, contact us immediately for an emergency appointment.  Follow-Up: We have scheduled your next follow-up appointment in 3 months to monitor your progress.  We look forward to continuing to support your health journey. If you have any questions or concerns before your next visit, please do not hesitate to contact our office.  Warm regards,  Dr. Langston Reusing,  Dermatology   Important Information  Due to recent changes in healthcare laws, you may see results of your pathology and/or laboratory studies on MyChart before the doctors have had a chance to review them. We understand that in some cases there may be results that are confusing or concerning to you. Please understand that not all results are received at the same time and often the doctors may need to interpret multiple results in order to provide you with the best plan of care or course of treatment. Therefore, we ask that you please give [place] 2 business days to thoroughly review all your results before contacting the office for clarification. Should we see a critical lab result, you will be contacted sooner.   If You Need Anything  After Your Visit  If you have any questions or concerns for your doctor, please call our main line at [phone] If no one answers, please leave a voicemail as directed and we will return your call as soon as possible. Messages left after 4 pm will be answered the following business day.   You may also send [place] a message via MyChart. We typically respond to MyChart messages within 1-2 business days.  For prescription refills, please ask your pharmacy to contact our office. Our fax number is [phone].  If you have an urgent issue when the clinic is closed that cannot wait until the next business day, you can page your doctor at the number below.    Please note that while we do our best to be available for urgent issues outside of office hours, we are not available 24/7.   If you have an urgent issue and are unable to reach [place], you may choose to seek medical care at your doctor's office, retail clinic, urgent care center, or emergency room.  If you have a medical emergency, please immediately call 911 or go to the emergency department. In the event of inclement weather, please call our main line at [phone] for an update on the status of any delays or closures.  Dermatology Medication Tips: Please keep the boxes that topical medications come in in order to help keep track of the instructions about where and how to use these. Pharmacies typically print the medication instructions only on the boxes and not directly on the medication tubes.   If your medication is  too expensive, please contact our office at [phone] or send [place] a message through MyChart.   We are unable to tell what your co-pay for medications will be in advance as this is different depending on your insurance coverage. However, we may be able to find a substitute medication at lower cost or fill out paperwork to get insurance to cover a needed medication.   If a prior authorization is required to get your medication  covered by your insurance company, please allow [place] 1-2 business days to complete this process.  Drug prices often vary depending on where the prescription is filled and some pharmacies may offer cheaper prices.  The website www.goodrx.com contains coupons for medications through different pharmacies. The prices here do not account for what the cost may be with help from insurance (it may be cheaper with your insurance), but the website can give you the price if you did not use any insurance.  - You can print the associated coupon and take it with your prescription to the pharmacy.  - You may also stop by our office during regular business hours and pick up a GoodRx coupon card.  - If you need your prescription sent electronically to a different pharmacy, notify our office through [facility] or by phone at [phone]

## 2023-06-15 ENCOUNTER — Ambulatory Visit
Admission: EM | Admit: 2023-06-15 | Discharge: 2023-06-15 | Disposition: A | Discharge status: Home / Self Care | Payer: BC Managed Care – PPO | Attending: Physician Assistant | Admitting: Physician Assistant

## 2023-06-15 ENCOUNTER — Ambulatory Visit (INDEPENDENT_AMBULATORY_CARE_PROVIDER_SITE_OTHER): Payer: BC Managed Care – PPO

## 2023-06-15 ENCOUNTER — Encounter: Payer: Self-pay | Admitting: Emergency Medicine

## 2023-06-15 DIAGNOSIS — J441 Chronic obstructive pulmonary disease with (acute) exacerbation: Secondary | ICD-10-CM | POA: Diagnosis not present

## 2023-06-15 DIAGNOSIS — R051 Acute cough: Secondary | ICD-10-CM

## 2023-06-15 DIAGNOSIS — R059 Cough, unspecified: Secondary | ICD-10-CM | POA: Diagnosis not present

## 2023-06-15 LAB — POC COVID19/FLU A&B COMBO
Covid Antigen, POC: NEGATIVE
Influenza A Antigen, POC: NEGATIVE
Influenza B Antigen, POC: NEGATIVE

## 2023-06-15 MED ORDER — ALBUTEROL SULFATE HFA 108 (90 BASE) MCG/ACT IN AERS
2.0000 | INHALATION_SPRAY | Freq: Once | RESPIRATORY_TRACT | Status: AC
  Administered 2023-06-15: 2 via RESPIRATORY_TRACT

## 2023-06-15 MED ORDER — CEFDINIR 300 MG PO CAPS: 300.0000 mg | ORAL_CAPSULE | Freq: Two times a day (BID) | ORAL | 0 refills | Status: DC

## 2023-06-15 MED ORDER — BENZONATATE 100 MG PO CAPS: 100.0000 mg | ORAL_CAPSULE | Freq: Three times a day (TID) | ORAL | 0 refills | Status: DC

## 2023-06-15 NOTE — Discharge Instructions (Signed)
 You tested negative for flu and COVID.  We are treating you for COPD exacerbation.  Use the albuterol every 4-6 hours as needed.  Start Omnicef twice daily for 10 days.  Use over-the-counter medication including Mucinex, Flonase, Tylenol, nasal saline sinus rinses.  Make sure you are resting and drinking plenty of fluid.  If your symptoms are not improving within a few days of starting antibiotics or if anything worsens and you have worsening cough, shortness of breath, chest pain, nausea/vomiting interfering with oral intake you need to be seen immediately.  Follow-up with your primary care next week.

## 2023-06-15 NOTE — ED Triage Notes (Signed)
 Pt present with Flu like symptoms that onset Tuesday with sneezing only. Then the symptoms worsened over the next coup[le days. Pt also complains of fatigue. Pt visibly has the chills during triage. Pt denies SOB but does complain of loss of appetite.
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
## 2023-06-20 ENCOUNTER — Telehealth: Payer: Self-pay

## 2023-06-20 NOTE — Telephone Encounter (Signed)
 I spoke with [name] and advised [employer] has made several attempt to reach him to schedule his delivery for Dupixent. I provided pt with the number to call for [employer] to schedule his delivery. I also advised pt to be sure to schedule a date he will be home to sign for the medication otherwise they will not deliver the meds because it will need to be refrigerated upon arrival. Patient voiced his understanding.

## 2023-06-25 ENCOUNTER — Encounter: Payer: Self-pay | Admitting: Dermatology

## 2023-07-15 ENCOUNTER — Emergency Department (HOSPITAL_COMMUNITY)

## 2023-07-15 ENCOUNTER — Emergency Department (HOSPITAL_COMMUNITY)
Admission: EM | Admit: 2023-07-15 | Discharge: 2023-07-15 | Disposition: A | Discharge status: Home / Self Care | Attending: Emergency Medicine | Admitting: Emergency Medicine

## 2023-07-15 DIAGNOSIS — Z87891 Personal history of nicotine dependence: Secondary | ICD-10-CM | POA: Diagnosis not present

## 2023-07-15 DIAGNOSIS — I10 Essential (primary) hypertension: Secondary | ICD-10-CM | POA: Diagnosis not present

## 2023-07-15 DIAGNOSIS — N433 Hydrocele, unspecified: Secondary | ICD-10-CM | POA: Diagnosis not present

## 2023-07-15 DIAGNOSIS — N5089 Other specified disorders of the male genital organs: Secondary | ICD-10-CM | POA: Diagnosis not present

## 2023-07-15 DIAGNOSIS — N451 Epididymitis: Secondary | ICD-10-CM | POA: Insufficient documentation

## 2023-07-15 DIAGNOSIS — N50812 Left testicular pain: Secondary | ICD-10-CM | POA: Diagnosis not present

## 2023-07-15 DIAGNOSIS — D72829 Elevated white blood cell count, unspecified: Secondary | ICD-10-CM | POA: Insufficient documentation

## 2023-07-15 DIAGNOSIS — R1030 Lower abdominal pain, unspecified: Secondary | ICD-10-CM | POA: Diagnosis not present

## 2023-07-15 DIAGNOSIS — N503 Cyst of epididymis: Secondary | ICD-10-CM | POA: Diagnosis not present

## 2023-07-15 DIAGNOSIS — R1032 Left lower quadrant pain: Secondary | ICD-10-CM | POA: Diagnosis not present

## 2023-07-15 LAB — CBC WITH DIFFERENTIAL/PLATELET
Abs Immature Granulocytes: 0.07 10*3/uL (ref 0.00–0.07)
Basophils Absolute: 0.1 10*3/uL (ref 0.0–0.1)
Basophils Relative: 1 %
Eosinophils Absolute: 0.2 10*3/uL (ref 0.0–0.5)
Eosinophils Relative: 1 %
HCT: 47.4 % (ref 39.0–52.0)
Hemoglobin: 15.9 g/dL (ref 13.0–17.0)
Immature Granulocytes: 0 %
Lymphocytes Relative: 9 %
Lymphs Abs: 1.7 10*3/uL (ref 0.7–4.0)
MCH: 32.1 pg (ref 26.0–34.0)
MCHC: 33.5 g/dL (ref 30.0–36.0)
MCV: 95.8 fL (ref 80.0–100.0)
Monocytes Absolute: 1.5 10*3/uL — ABNORMAL HIGH (ref 0.1–1.0)
Monocytes Relative: 8 %
Neutro Abs: 14.7 10*3/uL — ABNORMAL HIGH (ref 1.7–7.7)
Neutrophils Relative %: 81 %
Platelets: 321 10*3/uL (ref 150–400)
RBC: 4.95 MIL/uL (ref 4.22–5.81)
RDW: 13.9 % (ref 11.5–15.5)
WBC: 18.3 10*3/uL — ABNORMAL HIGH (ref 4.0–10.5)
nRBC: 0 % (ref 0.0–0.2)

## 2023-07-15 LAB — URINALYSIS, ROUTINE W REFLEX MICROSCOPIC
Bilirubin Urine: NEGATIVE
Glucose, UA: NEGATIVE mg/dL
Ketones, ur: NEGATIVE mg/dL
Nitrite: NEGATIVE
Protein, ur: NEGATIVE mg/dL
Specific Gravity, Urine: 1.02 (ref 1.005–1.030)
pH: 6 (ref 5.0–8.0)

## 2023-07-15 LAB — COMPREHENSIVE METABOLIC PANEL
ALT: 18 U/L (ref 0–44)
AST: 24 U/L (ref 15–41)
Albumin: 3.5 g/dL (ref 3.5–5.0)
Alkaline Phosphatase: 68 U/L (ref 38–126)
Anion gap: 11 (ref 5–15)
BUN: 9 mg/dL (ref 6–20)
CO2: 24 mmol/L (ref 22–32)
Calcium: 9.1 mg/dL (ref 8.9–10.3)
Chloride: 100 mmol/L (ref 98–111)
Creatinine, Ser: 0.58 mg/dL — ABNORMAL LOW (ref 0.61–1.24)
GFR, Estimated: >60 mL/min (ref >60)
Glucose, Bld: 79 mg/dL (ref 70–99)
Potassium: 3.9 mmol/L (ref 3.5–5.1)
Sodium: 135 mmol/L (ref 135–145)
Total Bilirubin: 1.1 mg/dL (ref 0.0–1.2)
Total Protein: 6.5 g/dL (ref 6.5–8.1)

## 2023-07-15 LAB — LIPASE, BLOOD: Lipase: 29 U/L (ref 11–51)

## 2023-07-15 LAB — URINALYSIS, MICROSCOPIC (REFLEX)

## 2023-07-15 MED ORDER — IOHEXOL 350 MG/ML SOLN
75.0000 mL | Freq: Once | INTRAVENOUS | Status: AC | PRN
  Administered 2023-07-15: 75 mL via INTRAVENOUS

## 2023-07-15 MED ORDER — MORPHINE SULFATE (PF) 4 MG/ML IV SOLN
4.0000 mg | Freq: Once | INTRAVENOUS | Status: AC
  Administered 2023-07-15: 4 mg via INTRAVENOUS
  Filled 2023-07-15: qty 1

## 2023-07-15 MED ORDER — OXYCODONE HCL 5 MG PO TABS: 5.0000 mg | ORAL_TABLET | Freq: Four times a day (QID) | ORAL | 0 refills | Status: DC | PRN

## 2023-07-15 MED ORDER — ONDANSETRON HCL 4 MG/2ML IJ SOLN
4.0000 mg | Freq: Once | INTRAMUSCULAR | Status: AC
  Administered 2023-07-15: 4 mg via INTRAVENOUS
  Filled 2023-07-15: qty 2

## 2023-07-15 MED ORDER — DOXYCYCLINE HYCLATE 100 MG PO TABS
100.0000 mg | ORAL_TABLET | Freq: Once | ORAL | Status: AC
  Administered 2023-07-15: 100 mg via ORAL
  Filled 2023-07-15: qty 1

## 2023-07-15 MED ORDER — SENNOSIDES-DOCUSATE SODIUM 8.6-50 MG PO TABS: 1.0000 | ORAL_TABLET | Freq: Every evening | ORAL | 0 refills | Status: AC | PRN

## 2023-07-15 MED ORDER — SODIUM CHLORIDE 0.9 % IV BOLUS
500.0000 mL | Freq: Once | INTRAVENOUS | Status: AC
  Administered 2023-07-15: 500 mL via INTRAVENOUS

## 2023-07-15 MED ORDER — KETOROLAC TROMETHAMINE 30 MG/ML IJ SOLN
30.0000 mg | Freq: Once | INTRAMUSCULAR | Status: AC
  Administered 2023-07-15: 30 mg via INTRAVENOUS
  Filled 2023-07-15: qty 1

## 2023-07-15 MED ORDER — IBUPROFEN 800 MG PO TABS: 800.0000 mg | ORAL_TABLET | Freq: Three times a day (TID) | ORAL | 0 refills | Status: DC | PRN

## 2023-07-15 MED ORDER — SODIUM CHLORIDE 0.9 % IV SOLN
1.0000 g | Freq: Once | INTRAVENOUS | Status: AC
  Administered 2023-07-15: 1 g via INTRAVENOUS
  Filled 2023-07-15: qty 10

## 2023-07-15 MED ORDER — DOXYCYCLINE HYCLATE 100 MG PO CAPS: 100.0000 mg | ORAL_CAPSULE | Freq: Two times a day (BID) | ORAL | 0 refills | Status: AC

## 2023-07-15 NOTE — Discharge Instructions (Signed)
 Please take your pain medicine and antibiotics as prescribed.  I am taking you out of work for the next week.  Please call the urology office first thing in the morning to schedule a follow-up appointment ideally within the next 1 to 2 weeks.

## 2023-07-15 NOTE — ED Triage Notes (Addendum)
 PT endorses 8/10 pain and some swelling to left testicle.  Pt has hx of hernias in both groins many years ago with mesh placement. PT is concerned that mesh may have moved. No pain with urination. Pt is lying in chair to be comfortable.
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
## 2023-07-15 NOTE — ED Notes (Signed)
 Patient transported to Ultrasound

## 2023-07-20 ENCOUNTER — Telehealth: Payer: Self-pay

## 2023-07-20 NOTE — Telephone Encounter (Signed)
 I spoke with Terry D. Approved on 07/09/23-11/07/23 for Dupixent. Ok to fill with [name]. She is faxing the approval to our office.

## 2023-07-27 ENCOUNTER — Encounter: Payer: Self-pay | Admitting: Family Medicine

## 2023-07-27 ENCOUNTER — Ambulatory Visit (INDEPENDENT_AMBULATORY_CARE_PROVIDER_SITE_OTHER): Admitting: Family Medicine

## 2023-07-27 VITALS — BP 124/80 | HR 85 | Temp 98.7°F | Ht 69.0 in | Wt 143.2 lb

## 2023-07-27 DIAGNOSIS — L309 Dermatitis, unspecified: Secondary | ICD-10-CM | POA: Diagnosis not present

## 2023-07-27 DIAGNOSIS — N451 Epididymitis: Secondary | ICD-10-CM | POA: Insufficient documentation

## 2023-07-27 DIAGNOSIS — F17200 Nicotine dependence, unspecified, uncomplicated: Secondary | ICD-10-CM

## 2023-07-27 DIAGNOSIS — I25118 Atherosclerotic heart disease of native coronary artery with other forms of angina pectoris: Secondary | ICD-10-CM

## 2023-07-27 NOTE — Assessment & Plan Note (Signed)
 Severe atopic eczematous dermatitis now on Dupixent with benefit.  Encouraged he continue regular moisturizer use and regular dermatology f/u.

## 2023-07-27 NOTE — Patient Instructions (Addendum)
 Restart plavix and atorvastatin.  Recent epididymitis is improving.  Return in 4-6 months for physical.

## 2023-07-27 NOTE — Assessment & Plan Note (Signed)
 ER records reviewed.  Treated with doxycycline 10d course with progressive improvement.  Discussed dx and treatment.  He will keep urology appt.

## 2023-07-27 NOTE — Progress Notes (Signed)
 Ph: [phone] Fax: [phone]   Patient ID: [name], male    DOB: [birth year], 53 y.o.   MRN: [id number]  This visit was conducted in person.  BP 124/80   Pulse 85   Temp 98.7 F (37.1 C) (Oral)   Ht 5\' 9"  (1.753 m)   Wt 143 lb 4 oz (65 kg)   SpO2 95%   BMI 21.15 kg/m    CC: ER f/u visit  Subjective:   HPI: [name] is a 53 y.o. male presenting on 07/27/2023 for Hospitalization Follow-up (Seen on 07/15/23 at [facility] ED, dx epididymitis. )   Recent [facility] visit for COPD exacerbation 05/2023 treated with omnicef and tessalon perls. CXR at the time clear.   Seen at [facility] ER 3/2025with L inguinal pain associated with L testicular swelling, WBC 18, CT and US showed epididymitis, no recurrent inguinal hernia (s/p remote bilateral inguinal hernia repair), treated with IV rocephin followed oral doxycycline course, with ibuprofen 800mg  and oxycodone for breakthrough pain. CT ?SBO - pt asxs from this standpoint.   He completed doxycycline antibiotic - notes some persistent left sided hard area but swelling and pain significantly improved.  Did have hematospermia prior to episode.  He has [facility] urology f/u next week.   03/2023 had hospitalization for NSTEMI presenting with chest pain and jaw pain, Troponin I peaked at 1697. L heart catheterization at the time with minimal CAD - ?plaque rupture causing symptoms. Started on plavix x6 months. Continued smoker 1ppd.   Saw derm, now on Dupixent injections for severe atopic dermatitis and eczema.      Relevant past medical, surgical, family and social history reviewed and updated as indicated. Interim medical history since our last visit reviewed. Allergies and medications reviewed and updated. Outpatient Medications Prior to Visit  Medication Sig Dispense Refill   aspirin EC 81 MG tablet Take 1 tablet (81 mg total) by mouth daily. Swallow whole. 30 tablet 12   Dupilumab (DUPIXENT) 300 MG/2ML SOAJ Inject 300 mg into the skin every 14  (fourteen) days. Starting at day 15 for maintenance. 4 mL 6   EPINEPHrine 0.3 mg/0.3 mL IJ SOAJ injection Inject 0.3 mg into the muscle daily as needed. 2 each 0   Fluocinonide 0.1 % CREA Apply a small amount to affected area twice a day if needed.  Don't use on the face. 60 g 0   senna-docusate (SENOKOT-S) 8.6-50 MG tablet Take 1 tablet by mouth at bedtime as needed for mild constipation. 20 tablet 0   tacrolimus (PROTOPIC) 0.1 % ointment Apply topically 2 (two) times daily. Apply to affected areas of rash x 2 weeks (alternate with triamcinolone every 2 weeks) 100 g 1   triamcinolone cream (KENALOG) 0.1 % Apply 1 Application topically 2 (two) times daily. Can use as needed twice daily over the affected areas x 2 weeks. Take a 2 week break before restarting. Do not use over the face. 453 g 1   triamcinolone ointment (KENALOG) 0.1 % Apply 1 Application topically 2 (two) times daily. Apply to affected areas of rash twice daily x 2 weeks 453 g 1   atorvastatin (LIPITOR) 40 MG tablet Take 1 tablet (40 mg total) by mouth daily. (Patient not taking: Reported on 07/27/2023) 90 tablet 3   clopidogrel (PLAVIX) 75 MG tablet Take 1 tablet (75 mg total) by mouth daily with breakfast. (Patient not taking: Reported on 07/27/2023) 181 tablet 0   benzonatate (TESSALON) 100 MG capsule Take 1 capsule (100  mg total) by mouth every 8 (eight) hours. (Patient not taking: Reported on 07/27/2023) 21 capsule 0   carvedilol (COREG) 3.125 MG tablet Take 1 tablet (3.125 mg total) by mouth 2 (two) times daily with a meal. (Patient not taking: Reported on 07/27/2023) 180 tablet 3   cefdinir (OMNICEF) 300 MG capsule Take 1 capsule (300 mg total) by mouth 2 (two) times daily. (Patient not taking: Reported on 07/27/2023) 20 capsule 0   hydrOXYzine (ATARAX) 25 MG tablet Take 1 tablet (25 mg total) by mouth every 8 (eight) hours as needed for itching. 12 tablet 0   ibuprofen (ADVIL) 800 MG tablet Take 1 tablet (800 mg total) by mouth every 8  (eight) hours as needed for moderate pain (pain score 4-6). 21 tablet 0   oxyCODONE (ROXICODONE) 5 MG immediate release tablet Take 1 tablet (5 mg total) by mouth every 6 (six) hours as needed for severe pain (pain score 7-10). 12 tablet 0   No facility-administered medications prior to visit.     Per HPI unless specifically indicated in ROS section below Review of Systems  Objective:  BP 124/80   Pulse 85   Temp 98.7 F (37.1 C) (Oral)   Ht 5\' 9"  (1.753 m)   Wt 143 lb 4 oz (65 kg)   SpO2 95%   BMI 21.15 kg/m   Wt Readings from Last 3 Encounters:  07/27/23 143 lb 4 oz (65 kg)  06/15/23 139 lb 15.9 oz (63.5 kg)  05/12/23 140 lb (63.5 kg)      Physical Exam Vitals and nursing note reviewed.  Constitutional:      Appearance: Normal appearance. He is not ill-appearing.  Abdominal:     Hernia: There is no hernia in the left inguinal area or right inguinal area.  Genitourinary:    Penis: Normal.      Testes: Normal.        Right: Mass, tenderness or swelling not present.        Left: Mass, tenderness or swelling not present.     Epididymis:     Right: Not inflamed or enlarged. No mass or tenderness.     Left: Enlarged. Not inflamed. No mass or tenderness.     Comments: Residual swelling to left scrotum above testicle but overall improved, without pain Lymphadenopathy:     Lower Body: No right inguinal adenopathy. No left inguinal adenopathy.  Neurological:     Mental Status: He is alert.       Results for orders placed or performed during the hospital encounter of 07/15/23  Comprehensive metabolic panel   Collection Time: 07/15/23  2:24 PM  Result Value Ref Range   Sodium 135 135 - 145 mmol/L   Potassium 3.9 3.5 - 5.1 mmol/L   Chloride 100 98 - 111 mmol/L   CO2 24 22 - 32 mmol/L   Glucose, Bld 79 70 - 99 mg/dL   BUN 9 6 - 20 mg/dL   Creatinine, Ser 1.32 (L) 0.61 - 1.24 mg/dL   Calcium 9.1 8.9 - 44.0 mg/dL   Total Protein 6.5 6.5 - 8.1 g/dL   Albumin 3.5 3.5 - 5.0  g/dL   AST 24 15 - 41 U/L   ALT 18 0 - 44 U/L   Alkaline Phosphatase 68 38 - 126 U/L   Total Bilirubin 1.1 0.0 - 1.2 mg/dL   GFR, Estimated >10 >27 mL/min   Anion gap 11 5 - 15  Lipase, blood   Collection Time: 07/15/23  2:24 PM  Result Value Ref Range   Lipase 29 11 - 51 U/L  CBC with Differential   Collection Time: 07/15/23  2:24 PM  Result Value Ref Range   WBC 18.3 (H) 4.0 - 10.5 K/uL   RBC 4.95 4.22 - 5.81 MIL/uL   Hemoglobin 15.9 13.0 - 17.0 g/dL   HCT 40.9 81.1 - 91.4 %   MCV 95.8 80.0 - 100.0 fL   MCH 32.1 26.0 - 34.0 pg   MCHC 33.5 30.0 - 36.0 g/dL   RDW 78.2 95.6 - 21.3 %   Platelets 321 150 - 400 K/uL   nRBC 0.0 0.0 - 0.2 %   Neutrophils Relative % 81 %   Neutro Abs 14.7 (H) 1.7 - 7.7 K/uL   Lymphocytes Relative 9 %   Lymphs Abs 1.7 0.7 - 4.0 K/uL   Monocytes Relative 8 %   Monocytes Absolute 1.5 (H) 0.1 - 1.0 K/uL   Eosinophils Relative 1 %   Eosinophils Absolute 0.2 0.0 - 0.5 K/uL   Basophils Relative 1 %   Basophils Absolute 0.1 0.0 - 0.1 K/uL   Immature Granulocytes 0 %   Abs Immature Granulocytes 0.07 0.00 - 0.07 K/uL  Urinalysis, Routine w reflex microscopic -Urine, Clean Catch   Collection Time: 07/15/23  2:24 PM  Result Value Ref Range   Color, Urine YELLOW YELLOW   APPearance HAZY (A) CLEAR   Specific Gravity, Urine 1.020 1.005 - 1.030   pH 6.0 5.0 - 8.0   Glucose, UA NEGATIVE NEGATIVE mg/dL   Hgb urine dipstick TRACE (A) NEGATIVE   Bilirubin Urine NEGATIVE NEGATIVE   Ketones, ur NEGATIVE NEGATIVE mg/dL   Protein, ur NEGATIVE NEGATIVE mg/dL   Nitrite NEGATIVE NEGATIVE   Leukocytes,Ua TRACE (A) NEGATIVE  Urinalysis, Microscopic (reflex)   Collection Time: 07/15/23  2:24 PM  Result Value Ref Range   RBC / HPF 0-5 0 - 5 RBC/hpf   WBC, UA 6-10 0 - 5 WBC/hpf   Bacteria, UA RARE (A) NONE SEEN   Squamous Epithelial / HPF 0-5 0 - 5 /HPF   Mucus PRESENT     Assessment & Plan:   Problem List Items Addressed This Visit     Smoker   Continued 1  ppd smoker. Encouraged full cessation.  Precontemplative.       Eczematous dermatitis   Severe atopic eczematous dermatitis now on Dupixent with benefit.  Encouraged he continue regular moisturizer use and regular dermatology f/u.       CAD (coronary artery disease)   Suffered NSTEMI 03/2023 - ?from plaque rupture as catheterization without significant CAD.  Plan was plavix x72months, continue aspirin and statin  - however he had stopped plavix and statin in setting of recent dermatitis in case it was drug-related. Rec restart plavix and statin.       Left epididymitis - Primary   ER records reviewed.  Treated with doxycycline 10d course with progressive improvement.  Discussed dx and treatment.  He will keep urology appt.         No orders of the defined types were placed in this encounter.   No orders of the defined types were placed in this encounter.   Patient Instructions  Restart plavix and atorvastatin.  Recent epididymitis is improving.  Return in 4-6 months for physical.   Follow up plan: Return in about 4 months (around 11/26/2023) for annual exam, prior fasting for blood work.  Eustaquio Boyden, MD

## 2023-07-27 NOTE — Assessment & Plan Note (Signed)
 Suffered NSTEMI 03/2023 - ?from plaque rupture as catheterization without significant CAD.  Plan was plavix x63months, continue aspirin and statin  - however he had stopped plavix and statin in setting of recent dermatitis in case it was drug-related. Rec restart plavix and statin.

## 2023-07-27 NOTE — Assessment & Plan Note (Signed)
 Continued 1 ppd smoker. Encouraged full cessation.  Precontemplative.

## 2023-08-02 DIAGNOSIS — N453 Epididymo-orchitis: Secondary | ICD-10-CM | POA: Diagnosis not present

## 2023-08-02 DIAGNOSIS — N5201 Erectile dysfunction due to arterial insufficiency: Secondary | ICD-10-CM | POA: Diagnosis not present

## 2023-08-02 DIAGNOSIS — R3915 Urgency of urination: Secondary | ICD-10-CM | POA: Diagnosis not present

## 2023-08-02 DIAGNOSIS — N401 Enlarged prostate with lower urinary tract symptoms: Secondary | ICD-10-CM | POA: Diagnosis not present

## 2023-08-14 NOTE — Progress Notes (Unsigned)
     [name] T. Terry Galvao, MD, CAQ Sports Medicine [facility] at [facility] [address] [place], [address]  Phone: [phone]  FAX: [phone]  [name] - 53 y.o. male  MRN [id number]  Date of Birth: 09/03/1970  Date: 08/15/2023  PCP: Claire Crick, MD  Referral: Claire Crick, MD  No chief complaint on file.  Subjective:   [name] is a 53 y.o. very pleasant male patient with There is no height or weight on file to calculate BMI. who presents with the following:  [name] is a very nice guy who I have seen a number of times in the past for other issues, he presents with some left-sided knee pain.    Review of Systems is noted in the HPI, as appropriate  Objective:   There were no vitals taken for this visit.  GEN: No acute distress; alert,appropriate. PULM: Breathing comfortably in no respiratory distress PSYCH: Normally interactive.   Laboratory and Imaging Data:  Assessment and Plan:   ***

## 2023-08-15 ENCOUNTER — Ambulatory Visit (INDEPENDENT_AMBULATORY_CARE_PROVIDER_SITE_OTHER): Admitting: Family Medicine

## 2023-08-15 ENCOUNTER — Encounter: Payer: Self-pay | Admitting: Family Medicine

## 2023-08-15 ENCOUNTER — Ambulatory Visit
Admission: RE | Admit: 2023-08-15 | Discharge: 2023-08-15 | Disposition: A | Discharge status: Home / Self Care | Source: Ambulatory Visit | Attending: Family Medicine | Admitting: Family Medicine

## 2023-08-15 VITALS — BP 132/86 | HR 95 | Temp 98.2°F | Ht 69.0 in | Wt 144.1 lb

## 2023-08-15 DIAGNOSIS — M76892 Other specified enthesopathies of left lower limb, excluding foot: Secondary | ICD-10-CM | POA: Diagnosis not present

## 2023-08-15 DIAGNOSIS — M25561 Pain in right knee: Secondary | ICD-10-CM

## 2023-08-15 DIAGNOSIS — M25462 Effusion, left knee: Secondary | ICD-10-CM | POA: Diagnosis not present

## 2023-08-15 DIAGNOSIS — M25562 Pain in left knee: Secondary | ICD-10-CM

## 2023-08-15 DIAGNOSIS — M7652 Patellar tendinitis, left knee: Secondary | ICD-10-CM

## 2023-08-15 MED ORDER — PREDNISONE 20 MG PO TABS: ORAL_TABLET | ORAL | 0 refills | Status: DC

## 2023-10-08 ENCOUNTER — Inpatient Hospital Stay: Admission: RE | Admit: 2023-10-08 | Source: Ambulatory Visit

## 2023-11-26 ENCOUNTER — Encounter: Admitting: Family Medicine

## 2023-11-30 ENCOUNTER — Encounter: Payer: Self-pay | Admitting: Family Medicine

## 2023-11-30 ENCOUNTER — Ambulatory Visit (INDEPENDENT_AMBULATORY_CARE_PROVIDER_SITE_OTHER): Admitting: Family Medicine

## 2023-11-30 ENCOUNTER — Ambulatory Visit (INDEPENDENT_AMBULATORY_CARE_PROVIDER_SITE_OTHER)
Admission: RE | Admit: 2023-11-30 | Discharge: 2023-11-30 | Disposition: A | Discharge status: Home / Self Care | Source: Ambulatory Visit | Attending: Family Medicine | Admitting: Family Medicine

## 2023-11-30 VITALS — BP 108/60 | HR 62 | Temp 98.6°F | Ht 69.0 in | Wt 141.0 lb

## 2023-11-30 DIAGNOSIS — R768 Other specified abnormal immunological findings in serum: Secondary | ICD-10-CM

## 2023-11-30 DIAGNOSIS — F17200 Nicotine dependence, unspecified, uncomplicated: Secondary | ICD-10-CM

## 2023-11-30 DIAGNOSIS — M79641 Pain in right hand: Secondary | ICD-10-CM | POA: Diagnosis not present

## 2023-11-30 DIAGNOSIS — M19049 Primary osteoarthritis, unspecified hand: Secondary | ICD-10-CM | POA: Diagnosis not present

## 2023-11-30 DIAGNOSIS — L309 Dermatitis, unspecified: Secondary | ICD-10-CM | POA: Diagnosis not present

## 2023-11-30 DIAGNOSIS — E785 Hyperlipidemia, unspecified: Secondary | ICD-10-CM

## 2023-11-30 DIAGNOSIS — M19041 Primary osteoarthritis, right hand: Secondary | ICD-10-CM | POA: Diagnosis not present

## 2023-11-30 DIAGNOSIS — M7989 Other specified soft tissue disorders: Secondary | ICD-10-CM | POA: Diagnosis not present

## 2023-11-30 DIAGNOSIS — I25118 Atherosclerotic heart disease of native coronary artery with other forms of angina pectoris: Secondary | ICD-10-CM

## 2023-11-30 DIAGNOSIS — F1090 Alcohol use, unspecified, uncomplicated: Secondary | ICD-10-CM

## 2023-11-30 MED ORDER — DEXAMETHASONE SODIUM PHOSPHATE 10 MG/ML IJ SOLN
10.0000 mg | Freq: Once | INTRAMUSCULAR | Status: AC
  Administered 2023-11-30: 10 mg via INTRAMUSCULAR

## 2023-11-30 MED ORDER — ATORVASTATIN CALCIUM 40 MG PO TABS: 40.0000 mg | ORAL_TABLET | Freq: Every day | ORAL | 3 refills | Status: AC

## 2023-11-30 NOTE — Assessment & Plan Note (Addendum)
 Mildly positive 2022 (titers 1:40) - saw rheum, thought false positive.  Repeat today.

## 2023-11-30 NOTE — Progress Notes (Signed)
 Ph: [phone] Fax: [phone]   Patient ID: [name], male    DOB: [birth year], 53 y.o.   MRN: [id number]  This visit was conducted in person.  BP 108/60   Pulse 62   Temp 98.6 F (37 C) (Oral)   Ht 5' 9 (1.753 m)   Wt 141 lb (64 kg)   SpO2 100%   BMI 20.82 kg/m    CC: R hand pain  Subjective:   HPI: [name] is a 53 y.o. male presenting on 11/30/2023 for Pain (Knuckle in right hand middle finger )   2 wk h/o R 3rd MCPJ - sharp stabbing severe pain that lasts seconds at a time.  Cortisone shot helped during last exacerbation (2022).  Denies inciting trauma/injury or falls.  Has tried asper cream, topical voltaren , arthritis BC powders with limited relief.  He is right handed.  No left hand pain.  Very active at work - increased hand use due to this.  No triggering of fingers.   Endorses h/o R toe podagra ~20 yrs ago.   Saw rheum 01/2021 thought predominantly osteoarthritis related pain.  R hand xray 01/2021 - small hook osteophyte process forming at the 3rd MCP (see below).   Severe eczema/atopic dermatitis on Dupixent  by dermatologist.   H/o NSTEMI 03/2023 based on increased TnI however LHC at the time with minimal CAD found, did have 35% stenosis to mid RCA. ?plaque rupture causing symptoms.   He stopped taking atorvastatin  - ran out. Refilled today.   Continued smoker - 1ppd Alcohol - 3 beers on weekdays, 12 pack per day on weekends      Relevant past medical, surgical, family and social history reviewed and updated as indicated. Interim medical history since our last visit reviewed. Allergies and medications reviewed and updated. Outpatient Medications Prior to Visit  Medication Sig Dispense Refill   aspirin  EC 81 MG tablet Take 1 tablet (81 mg total) by mouth daily. Swallow whole. 30 tablet 12   Dupilumab  (DUPIXENT ) 300 MG/2ML SOAJ Inject 300 mg into the skin every 14 (fourteen) days. Starting at day 15 for maintenance. 4 mL 6   EPINEPHrine  0.3  mg/0.3 mL IJ SOAJ injection Inject 0.3 mg into the muscle daily as needed. 2 each 0   senna-docusate (SENOKOT-S) 8.6-50 MG tablet Take 1 tablet by mouth at bedtime as needed for mild constipation. (Patient not taking: Reported on 11/30/2023) 20 tablet 0   atorvastatin  (LIPITOR) 40 MG tablet Take 1 tablet (40 mg total) by mouth daily. (Patient not taking: Reported on 11/30/2023) 90 tablet 3   predniSONE  (DELTASONE ) 20 MG tablet 2 tabs po daily for 5 days, then 1 tab po daily for 5 days 15 tablet 0   No facility-administered medications prior to visit.     Per HPI unless specifically indicated in ROS section below Review of Systems  Objective:  BP 108/60   Pulse 62   Temp 98.6 F (37 C) (Oral)   Ht 5' 9 (1.753 m)   Wt 141 lb (64 kg)   SpO2 100%   BMI 20.82 kg/m   Wt Readings from Last 3 Encounters:  11/30/23 141 lb (64 kg)  08/15/23 144 lb 2 oz (65.4 kg)  07/27/23 143 lb 4 oz (65 kg)      Physical Exam Vitals and nursing note reviewed.  Constitutional:      Appearance: Normal appearance. He is not ill-appearing.  Musculoskeletal:        General: Swelling and  tenderness present. No signs of injury.     Comments:  2+ rad pulses L hand WNL R hand with swelling centered to 3rd MCPJ, marked tenderness to palpation at 3rd MCPJ, pain with flexion/extension, pain with axial loading, nodularity to palmar hand   Skin:    General: Skin is warm.     Findings: No erythema.  Neurological:     Mental Status: He is alert.  Psychiatric:        Mood and Affect: Mood normal.        Behavior: Behavior normal.       Results for orders placed or performed in visit on 11/30/23  C-reactive protein   Collection Time: 11/30/23  2:16 PM  Result Value Ref Range   CRP <3.0 <8.0 mg/L  CBC with Differential/Platelet   Collection Time: 11/30/23  2:16 PM  Result Value Ref Range   WBC 8.7 3.8 - 10.8 Thousand/uL   RBC 4.80 4.20 - 5.80 Million/uL   Hemoglobin 15.9 13.2 - 17.1 g/dL   HCT 52.5 61.4 -  49.9 %   MCV 98.8 80.0 - 100.0 fL   MCH 33.1 (H) 27.0 - 33.0 pg   MCHC 33.5 32.0 - 36.0 g/dL   RDW 86.5 88.9 - 84.9 %   Platelets 353 140 - 400 Thousand/uL   MPV 9.6 7.5 - 12.5 fL   Neutro Abs 5,368 1,500 - 7,800 cells/uL   Absolute Lymphocytes 2,262 850 - 3,900 cells/uL   Absolute Monocytes 670 200 - 950 cells/uL   Eosinophils Absolute 348 15 - 500 cells/uL   Basophils Absolute 52 0 - 200 cells/uL   Neutrophils Relative % 61.7 %   Total Lymphocyte 26.0 %   Monocytes Relative 7.7 %   Eosinophils Relative 4.0 %   Basophils Relative 0.6 %  Comprehensive metabolic panel with GFR   Collection Time: 11/30/23  2:16 PM  Result Value Ref Range   Glucose, Bld 48 (L) 65 - 99 mg/dL   BUN 13 7 - 25 mg/dL   Creat 9.16 9.29 - 8.69 mg/dL   eGFR 895 > OR = 60 fO/fpw/8.26f7   BUN/Creatinine Ratio SEE NOTE: 6 - 22 (calc)   Sodium 143 135 - 146 mmol/L   Potassium 4.5 3.5 - 5.3 mmol/L   Chloride 105 98 - 110 mmol/L   CO2 24 20 - 32 mmol/L   Calcium  9.3 8.6 - 10.3 mg/dL   Total Protein 7.0 6.1 - 8.1 g/dL   Albumin 4.6 3.6 - 5.1 g/dL   Globulin 2.4 1.9 - 3.7 g/dL (calc)   AG Ratio 1.9 1.0 - 2.5 (calc)   Total Bilirubin 0.4 0.2 - 1.2 mg/dL   Alkaline phosphatase (APISO) 60 35 - 144 U/L   AST 21 10 - 35 U/L   ALT 16 9 - 46 U/L  Sedimentation rate   Collection Time: 11/30/23  2:16 PM  Result Value Ref Range   Sed Rate 2 0 - 20 mm/h  Uric acid   Collection Time: 11/30/23  2:16 PM  Result Value Ref Range   Uric Acid, Serum 4.8 4.0 - 8.0 mg/dL   X-ray right hand 2 views 01/2021 Radiocarpal joint space appears normal.  Mild first CMC joint degenerative  changes.  MCP joint spaces appear well-preserved but small hook osteophyte  process forming at the third MCP.  There is shortening of the fifth  metacarpal bone.  PIP joint spaces appear normal.  There are lateral  osteophytes at DIPs most advanced second and  third digits.  No erosions  seen.  Bone mineralization appears normal.  Impression   Osteoarthritis at first [facility] third MCP and DIP joints   Assessment & Plan:   Problem List Items Addressed This Visit     Habitual alcohol use   Recommend cutting down on alcohol intake.       Smoker   Continued 1ppd smoker. Continue to encourage full cessation - precontemplative      Right hand pain - Primary   Acute exacerbation of chronic issue. Last treated for this 2022, s/p rheumatology evaluation - negative for autoimmune condition, thought more consistent with osteoarthritis flare, imaging showed 3rd MCPJ osteophyte.  Possible gout - update uric acid today.  IM dexamethasone  10mg  x1.  Will also refer to hand surgery for further eval as pt states he would want more definitive treatment if available.       Relevant Orders   DG Hand Complete Right (Completed)   ANA   C-reactive protein (Completed)   CBC with Differential/Platelet (Completed)   Comprehensive metabolic panel with GFR (Completed)   Sedimentation rate (Completed)   Uric acid (Completed)   Ambulatory referral to Hand Surgery   HLD (hyperlipidemia)   Restart statin  The ASCVD Risk score (Arnett DK, et al., 2019) failed to calculate for the following reasons:   Risk score cannot be calculated because patient has a medical history suggesting prior/existing ASCVD       Relevant Medications   atorvastatin  (LIPITOR) 40 MG tablet   ANA positive   Mildly positive 2022 (titers 1:40) - saw rheum, thought false positive.  Repeat today.       Eczematous dermatitis   Severe atopic dermatitis followed by derm on Dupixent , appreciate their care.       Osteoarthritis of hand   CAD (coronary artery disease)   Agrees to restart statin.       Relevant Medications   atorvastatin  (LIPITOR) 40 MG tablet     Meds ordered this encounter  Medications   atorvastatin  (LIPITOR) 40 MG tablet    Sig: Take 1 tablet (40 mg total) by mouth daily.    Dispense:  90 tablet    Refill:  3   dexamethasone  (DECADRON )  injection 10 mg    Orders Placed This Encounter  Procedures   DG Hand Complete Right    Standing Status:   Future    Number of Occurrences:   1    Expiration Date:   11/29/2024    Reason for Exam (SYMPTOM  OR DIAGNOSIS REQUIRED):   acute R 3rd MCPJ pain/swelling in h/o OA    Preferred imaging location?:   [facility]   ANA   C-reactive protein   CBC with Differential/Platelet   Comprehensive metabolic panel with GFR   Sedimentation rate   Uric acid   Ambulatory referral to Hand Surgery    Referral Priority:   Routine    Referral Type:   Surgical    Referral Reason:   Specialty Services Required    Requested Specialty:   Hand Surgery    Number of Visits Requested:   1    Patient Instructions  Update xray today Labs today to evaluate for gout or other inflammatory arthritis.  Steroid shot today  Back off BC powders I will refer you to hand surgeon in [facility] (Fridays)  Follow up plan: Return if symptoms worsen or fail to improve.  Anton Blas, MD

## 2023-11-30 NOTE — Patient Instructions (Addendum)
 Update xray today Labs today to evaluate for gout or other inflammatory arthritis.  Steroid shot today  Back off BC powders I will refer you to hand surgeon in [facility] (Fridays)

## 2023-11-30 NOTE — Assessment & Plan Note (Addendum)
 Acute exacerbation of chronic issue. Last treated for this 2022, s/p rheumatology evaluation - negative for autoimmune condition, thought more consistent with osteoarthritis flare, imaging showed 3rd MCPJ osteophyte.  Possible gout - update uric acid today.  IM dexamethasone  10mg  x1.  Will also refer to hand surgery for further eval as pt states he would want more definitive treatment if available.

## 2023-12-01 LAB — COMPREHENSIVE METABOLIC PANEL WITH GFR
AG Ratio: 1.9 (calc) (ref 1.0–2.5)
ALT: 16 U/L (ref 9–46)
AST: 21 U/L (ref 10–35)
Albumin: 4.6 g/dL (ref 3.6–5.1)
Alkaline phosphatase (APISO): 60 U/L (ref 35–144)
BUN: 13 mg/dL (ref 7–25)
CO2: 24 mmol/L (ref 20–32)
Calcium: 9.3 mg/dL (ref 8.6–10.3)
Chloride: 105 mmol/L (ref 98–110)
Creat: 0.83 mg/dL (ref 0.70–1.30)
Globulin: 2.4 g/dL (ref 1.9–3.7)
Glucose, Bld: 48 mg/dL — ABNORMAL LOW (ref 65–99)
Potassium: 4.5 mmol/L (ref 3.5–5.3)
Sodium: 143 mmol/L (ref 135–146)
Total Bilirubin: 0.4 mg/dL (ref 0.2–1.2)
Total Protein: 7 g/dL (ref 6.1–8.1)
eGFR: 104 mL/min/1.73m2 (ref >=60)

## 2023-12-01 LAB — CBC WITH DIFFERENTIAL/PLATELET
Absolute Lymphocytes: 2262 {cells}/uL (ref 850–3900)
Absolute Monocytes: 670 {cells}/uL (ref 200–950)
Basophils Absolute: 52 {cells}/uL (ref 0–200)
Basophils Relative: 0.6 %
Eosinophils Absolute: 348 {cells}/uL (ref 15–500)
Eosinophils Relative: 4 %
HCT: 47.4 % (ref 38.5–50.0)
Hemoglobin: 15.9 g/dL (ref 13.2–17.1)
MCH: 33.1 pg — ABNORMAL HIGH (ref 27.0–33.0)
MCHC: 33.5 g/dL (ref 32.0–36.0)
MCV: 98.8 fL (ref 80.0–100.0)
MPV: 9.6 fL (ref 7.5–12.5)
Monocytes Relative: 7.7 %
Neutro Abs: 5368 {cells}/uL (ref 1500–7800)
Neutrophils Relative %: 61.7 %
Platelets: 353 Thousand/uL (ref 140–400)
RBC: 4.8 Million/uL (ref 4.20–5.80)
RDW: 13.4 % (ref 11.0–15.0)
Total Lymphocyte: 26 %
WBC: 8.7 Thousand/uL (ref 3.8–10.8)

## 2023-12-01 LAB — ANA: Anti Nuclear Antibody (ANA): NEGATIVE

## 2023-12-01 LAB — URIC ACID: Uric Acid, Serum: 4.8 mg/dL (ref 4.0–8.0)

## 2023-12-01 LAB — SEDIMENTATION RATE: Sed Rate: 2 mm/h (ref 0–20)

## 2023-12-01 LAB — C-REACTIVE PROTEIN: CRP: <3 mg/L (ref <8.0)

## 2023-12-01 NOTE — Assessment & Plan Note (Signed)
 Recommend cutting down on alcohol intake.

## 2023-12-01 NOTE — Assessment & Plan Note (Signed)
 Restart statin  The ASCVD Risk score (Arnett DK, et al., 2019) failed to calculate for the following reasons:   Risk score cannot be calculated because patient has a medical history suggesting prior/existing ASCVD

## 2023-12-01 NOTE — Assessment & Plan Note (Signed)
 Agrees to restart statin.

## 2023-12-01 NOTE — Assessment & Plan Note (Addendum)
 Severe atopic dermatitis followed by derm on Dupixent , appreciate their care.

## 2023-12-01 NOTE — Assessment & Plan Note (Addendum)
 Continued 1ppd smoker. Continue to encourage full cessation - precontemplative

## 2023-12-03 ENCOUNTER — Ambulatory Visit: Payer: Self-pay | Admitting: Family Medicine

## 2023-12-18 ENCOUNTER — Ambulatory Visit: Payer: BC Managed Care – PPO | Admitting: Dermatology

## 2023-12-18 ENCOUNTER — Telehealth: Payer: Self-pay | Admitting: Dermatology

## 2023-12-18 NOTE — Telephone Encounter (Signed)
 Patient called in to r/s appointment. He was r/s to November 24 at 2:45pm. He is wanting a call back in regards to his dupixent  medication. He states he was not able to come in today due to work and he is currently in [place] . Can someone please call him back. Thank you.

## 2024-01-16 DIAGNOSIS — S299XXA Unspecified injury of thorax, initial encounter: Secondary | ICD-10-CM | POA: Diagnosis not present

## 2024-01-16 DIAGNOSIS — R0781 Pleurodynia: Secondary | ICD-10-CM | POA: Diagnosis not present

## 2024-01-17 ENCOUNTER — Other Ambulatory Visit: Payer: Self-pay

## 2024-01-17 DIAGNOSIS — L209 Atopic dermatitis, unspecified: Secondary | ICD-10-CM

## 2024-01-17 DIAGNOSIS — L299 Pruritus, unspecified: Secondary | ICD-10-CM

## 2024-01-17 MED ORDER — DUPIXENT 300 MG/2ML ~~LOC~~ SOAJ: 300.0000 mg | SUBCUTANEOUS | 11 refills | Status: AC

## 2024-02-08 ENCOUNTER — Ambulatory Visit (INDEPENDENT_AMBULATORY_CARE_PROVIDER_SITE_OTHER): Payer: Self-pay | Admitting: Family Medicine

## 2024-02-08 ENCOUNTER — Encounter: Payer: Self-pay | Admitting: Family Medicine

## 2024-02-08 VITALS — BP 118/82 | HR 86 | Temp 98.0°F | Ht 68.0 in | Wt 144.0 lb

## 2024-02-08 DIAGNOSIS — E785 Hyperlipidemia, unspecified: Secondary | ICD-10-CM | POA: Diagnosis not present

## 2024-02-08 DIAGNOSIS — Z0000 Encounter for general adult medical examination without abnormal findings: Secondary | ICD-10-CM

## 2024-02-08 DIAGNOSIS — M159 Polyosteoarthritis, unspecified: Secondary | ICD-10-CM

## 2024-02-08 DIAGNOSIS — Z113 Encounter for screening for infections with a predominantly sexual mode of transmission: Secondary | ICD-10-CM

## 2024-02-08 DIAGNOSIS — F1090 Alcohol use, unspecified, uncomplicated: Secondary | ICD-10-CM

## 2024-02-08 DIAGNOSIS — Z125 Encounter for screening for malignant neoplasm of prostate: Secondary | ICD-10-CM

## 2024-02-08 DIAGNOSIS — M19049 Primary osteoarthritis, unspecified hand: Secondary | ICD-10-CM

## 2024-02-08 DIAGNOSIS — I25118 Atherosclerotic heart disease of native coronary artery with other forms of angina pectoris: Secondary | ICD-10-CM

## 2024-02-08 DIAGNOSIS — F17200 Nicotine dependence, unspecified, uncomplicated: Secondary | ICD-10-CM | POA: Diagnosis not present

## 2024-02-08 DIAGNOSIS — L309 Dermatitis, unspecified: Secondary | ICD-10-CM

## 2024-02-08 LAB — LIPID PANEL
Cholesterol: 241 mg/dL — ABNORMAL HIGH (ref 0–200)
HDL: 61.1 mg/dL (ref >39.00)
LDL Cholesterol: 148 mg/dL — ABNORMAL HIGH (ref 0–99)
NonHDL: 179.6
Total CHOL/HDL Ratio: 4
Triglycerides: 157 mg/dL — ABNORMAL HIGH (ref 0.0–149.0)
VLDL: 31.4 mg/dL (ref 0.0–40.0)

## 2024-02-08 LAB — BASIC METABOLIC PANEL WITH GFR
BUN: 10 mg/dL (ref 6–23)
CO2: 30 meq/L (ref 19–32)
Calcium: 9.9 mg/dL (ref 8.4–10.5)
Chloride: 101 meq/L (ref 96–112)
Creatinine, Ser: 0.63 mg/dL (ref 0.40–1.50)
GFR: 107.82 mL/min (ref >60.00)
Glucose, Bld: 78 mg/dL (ref 70–99)
Potassium: 4.7 meq/L (ref 3.5–5.1)
Sodium: 140 meq/L (ref 135–145)

## 2024-02-08 LAB — PSA: PSA: 1.61 ng/mL (ref 0.10–4.00)

## 2024-02-08 LAB — VITAMIN B12: Vitamin B-12: 182 pg/mL — ABNORMAL LOW (ref 211–911)

## 2024-02-08 NOTE — Assessment & Plan Note (Signed)
 Preventative protocols reviewed and updated unless pt declined. Discussed healthy diet and lifestyle.

## 2024-02-08 NOTE — Patient Instructions (Addendum)
 Labs today  Consider restarting atorvastatin  and daily aspirin  81mg  You may call below # to schedule hand surgeon appointment.  Referring To Provider Information OZELL BAIZE [address] [place]  [place] [address] [phone]  Good to see you today Return as needed or in 1 year for next physical

## 2024-02-08 NOTE — Progress Notes (Unsigned)
 Ph: [phone] Fax: [phone]   Patient ID: [name], male    DOB: 1970-10-21, 53 y.o.   MRN: [id number]  This visit was conducted in person.  BP 118/82   Pulse 86   Temp 98 F (36.7 C) (Oral)   Ht 5' 8 (1.727 m)   Wt 144 lb (65.3 kg)   SpO2 98%   BMI 21.90 kg/m    CC: CPE Subjective:   HPI: [name] is a 53 y.o. male presenting on 02/08/2024 for Annual Exam   H/o NSTEMI 03/2023 based on increased TnI however LHC at the time with minimal CAD found, did have 35% stenosis to mid RCA. ?plaque rupture causing symptoms.   Saw rheum 01/2021 thought predominantly osteoarthritis related pain.  Severe eczema/atopic dermatitis on Dupixent  by dermatologist.   HLD - has not seen taking atorvastatin . He is not regularly taking aspirin .   Request STD screen - no new sexual partners.   Preventative: Colonoscopy 12/2021 - 5 polyps (SSP, TA, HP), int hem, rpt 5 yrs [name])  Sister recently diagnosed with colon cancer  Prostate cancer screening - no nocturia. No fmhx. No prostate symptoms. PSA yearly Lung cancer screening - discussed, will refer Flu shot - declines COVID vaccine - declines Tdap 10/2017 Pneumovax 06/2017 Shingrix - discussed, declines Seat belt use discussed Sunscreen use discussed. No changing moles on skin. Father with skin cancer to nose.  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 Interim medical history since our last visit reviewed. Allergies and medications reviewed and updated. Outpatient Medications Prior to Visit  Medication Sig Dispense Refill   aspirin  EC 81 MG tablet Take 1 tablet (81 mg total) by mouth daily. Swallow whole. 30 tablet 12    EPINEPHrine  0.3 mg/0.3 mL IJ SOAJ injection Inject 0.3 mg into the muscle daily as needed. 2 each 0   atorvastatin  (LIPITOR) 40 MG tablet Take 1 tablet (40 mg total) by mouth daily. (Patient not taking: Reported on 02/08/2024) 90 tablet 3   Dupilumab  (DUPIXENT ) 300 MG/2ML SOAJ Inject 300 mg into the skin every 14 (fourteen) days. Starting at day 15 for maintenance. (Patient not taking: Reported on 02/08/2024) 4 mL 11   senna-docusate (SENOKOT-S) 8.6-50 MG tablet Take 1 tablet by mouth at bedtime as needed for mild constipation. (Patient not taking: Reported on 02/08/2024) 20 tablet 0   No facility-administered medications prior to visit.     Per HPI unless specifically indicated in ROS section below Review of Systems  Constitutional:  Negative for activity change, appetite change, chills, fatigue, fever and unexpected weight change.  HENT:  Negative for hearing loss.   Eyes:  Negative for visual disturbance.  Respiratory:  Positive for cough. Negative for chest tightness, shortness of breath and wheezing.   Cardiovascular:  Negative for chest pain, palpitations and leg swelling.  Gastrointestinal:  Negative for abdominal distention, abdominal pain, blood in stool, constipation, diarrhea, nausea and vomiting.  Genitourinary:  Negative for difficulty urinating and hematuria.  Musculoskeletal:  Positive for arthralgias. Negative for myalgias and neck pain.  Skin:  Negative for rash.  Neurological:  Negative for dizziness, seizures, syncope and headaches.  Hematological:  Negative  for adenopathy. Does not bruise/bleed easily.  Psychiatric/Behavioral:  Negative for dysphoric mood. The patient is not nervous/anxious.     Objective:  BP 118/82   Pulse 86   Temp 98 F (36.7 C) (Oral)   Ht 5' 8 (1.727 m)   Wt 144 lb (65.3 kg)   SpO2 98%   BMI 21.90 kg/m   Wt Readings from Last 3 Encounters:  02/08/24 144 lb (65.3 kg)  11/30/23 141 lb (64 kg)  08/15/23 144 lb 2 oz (65.4 kg)       Physical Exam Vitals and nursing note reviewed.  Constitutional:      General: He is not in acute distress.    Appearance: Normal appearance. He is well-developed. He is not ill-appearing.  HENT:     Head: Normocephalic and atraumatic.     Right Ear: Hearing, tympanic membrane, ear canal and external ear normal.     Left Ear: Hearing, tympanic membrane, ear canal and external ear normal.     Mouth/Throat:     Mouth: Mucous membranes are moist.     Pharynx: Oropharynx is clear. No oropharyngeal exudate or posterior oropharyngeal erythema.  Eyes:     General: No scleral icterus.    Extraocular Movements: Extraocular movements intact.     Conjunctiva/sclera: Conjunctivae normal.     Pupils: Pupils are equal, round, and reactive to light.  Neck:     Thyroid : No thyroid  mass or thyromegaly.  Cardiovascular:     Rate and Rhythm: Normal rate and regular rhythm.     Pulses: Normal pulses.          Radial pulses are 2+ on the right side and 2+ on the left side.     Heart sounds: Normal heart sounds. No murmur heard. Pulmonary:     Effort: Pulmonary effort is normal. No respiratory distress.     Breath sounds: Normal breath sounds. No wheezing, rhonchi or rales.  Abdominal:     General: Bowel sounds are normal. There is no distension.     Palpations: Abdomen is soft. There is no mass.     Tenderness: There is no abdominal tenderness. There is no guarding or rebound.     Hernia: No hernia is present.  Musculoskeletal:        General: Normal range of motion.     Cervical back: Normal range of motion and neck supple.     Right lower leg: No edema.     Left lower leg: No edema.  Lymphadenopathy:     Cervical: No cervical adenopathy.  Skin:    General: Skin is warm and dry.     Findings: No rash.  Neurological:     General: No focal deficit present.     Mental Status: He is alert and oriented to person, place, and time.  Psychiatric:        Mood and Affect: Mood normal.         Behavior: Behavior normal.        Thought Content: Thought content normal.        Judgment: Judgment normal.       Results for orders placed or performed in visit on 11/30/23  ANA   Collection Time: 11/30/23  2:16 PM  Result Value Ref Range   Anti Nuclear Antibody (ANA) NEGATIVE NEGATIVE  C-reactive protein   Collection Time: 11/30/23  2:16 PM  Result Value Ref Range   CRP <3.0 <8.0 mg/L  CBC with Differential/Platelet   Collection Time: 11/30/23  2:16 PM  Result Value Ref Range   WBC 8.7 3.8 - 10.8 Thousand/uL   RBC 4.80 4.20 - 5.80 Million/uL   Hemoglobin 15.9 13.2 - 17.1 g/dL   HCT 52.5 61.4 - 49.9 %   MCV 98.8 80.0 - 100.0 fL   MCH 33.1 (H) 27.0 - 33.0 pg   MCHC 33.5 32.0 - 36.0 g/dL   RDW 86.5 88.9 - 84.9 %   Platelets 353 140 - 400 Thousand/uL   MPV 9.6 7.5 - 12.5 fL   Neutro Abs 5,368 1,500 - 7,800 cells/uL   Absolute Lymphocytes 2,262 850 - 3,900 cells/uL   Absolute Monocytes 670 200 - 950 cells/uL   Eosinophils Absolute 348 15 - 500 cells/uL   Basophils Absolute 52 0 - 200 cells/uL   Neutrophils Relative % 61.7 %   Total Lymphocyte 26.0 %   Monocytes Relative 7.7 %   Eosinophils Relative 4.0 %   Basophils Relative 0.6 %  Comprehensive metabolic panel with GFR   Collection Time: 11/30/23  2:16 PM  Result Value Ref Range   Glucose, Bld 48 (L) 65 - 99 mg/dL   BUN 13 7 - 25 mg/dL   Creat 9.16 9.29 - 8.69 mg/dL   eGFR 895 > OR = 60 fO/fpw/8.26f7   BUN/Creatinine Ratio SEE NOTE: 6 - 22 (calc)   Sodium 143 135 - 146 mmol/L   Potassium 4.5 3.5 - 5.3 mmol/L   Chloride 105 98 - 110 mmol/L   CO2 24 20 - 32 mmol/L   Calcium  9.3 8.6 - 10.3 mg/dL   Total Protein 7.0 6.1 - 8.1 g/dL   Albumin 4.6 3.6 - 5.1 g/dL   Globulin 2.4 1.9 - 3.7 g/dL (calc)   AG Ratio 1.9 1.0 - 2.5 (calc)   Total Bilirubin 0.4 0.2 - 1.2 mg/dL   Alkaline phosphatase (APISO) 60 35 - 144 U/L   AST 21 10 - 35 U/L   ALT 16 9 - 46 U/L  Sedimentation rate   Collection Time: 11/30/23  2:16 PM   Result Value Ref Range   Sed Rate 2 0 - 20 mm/h  Uric acid   Collection Time: 11/30/23  2:16 PM  Result Value Ref Range   Uric Acid, Serum 4.8 4.0 - 8.0 mg/dL      89/82/7974   87:78 PM 11/30/2023    1:43 PM 07/27/2023   12:10 PM 05/02/2023    2:47 PM 04/17/2023    8:41 AM  Depression screen PHQ 2/9  Decreased Interest 0 0 0 0 0  Down, Depressed, Hopeless  0 0 0 0  PHQ - 2 Score 0 0 0 0 0  Altered sleeping 0 0 0  0  Tired, decreased energy  0 1  0  Change in appetite 0 0 1  0  Feeling bad or failure about yourself  0 0 1  0  Trouble concentrating  0 0  0  Moving slowly or fidgety/restless 0 0 0  0  Suicidal thoughts 0 0 0  0  PHQ-9 Score 0 0 3  0  Difficult doing work/chores Not difficult at all Not difficult at all Not difficult at all  Not difficult at all       02/08/2024   12:22 PM 11/30/2023    1:43 PM 07/27/2023   12:10 PM 05/02/2023    2:47 PM  GAD 7 : Generalized Anxiety Score  Nervous, Anxious, on Edge  1 1 0  Control/stop worrying 0 1 1 0  Worry too much - different things 0 1 1 0  Trouble relaxing 0 1 1 0  Restless 0 0 0 0  Easily annoyed or irritable 0 1 2 0  Afraid - awful might happen 0 1 2 0  Total GAD 7 Score  6 8 0  Anxiety Difficulty Not difficult at all Somewhat difficult Somewhat difficult    Assessment & Plan:   Problem List Items Addressed This Visit     Health maintenance examination - Primary (Chronic)   Preventative protocols reviewed and updated unless pt declined. Discussed healthy diet and lifestyle.       Habitual alcohol use   Relevant Orders   Vitamin B12   Vitamin B1   Smoker   HLD (hyperlipidemia)   Relevant Orders   Lipid panel   Basic metabolic panel with GFR   CAD (coronary artery disease)   Relevant Orders   Lipoprotein A (LPA)   Other Visit Diagnoses       Screen for STD (sexually transmitted disease)       Relevant Orders   RPR   HIV Antibody (routine testing w rflx)   C. trachomatis/N. gonorrhoeae RNA     Special  screening for malignant neoplasm of prostate       Relevant Orders   PSA        No orders of the defined types were placed in this encounter.   Orders Placed This Encounter  Procedures   C. trachomatis/N. gonorrhoeae RNA   RPR   HIV Antibody (routine testing w rflx)   Lipid panel   PSA   Basic metabolic panel with GFR   Vitamin B12   Vitamin B1   Lipoprotein A (LPA)    Patient Instructions  Labs today  Consider restarting atorvastatin  and daily aspirin  81mg  You may call below # to schedule hand surgeon appointment.  Referring To Provider Information [name] [address] [place]  [address] [place] [address] [phone]  Good to see you today Return as needed or in 1 year for next physical   Follow up plan: Return in about 1 year (around 02/07/2025) for annual exam, prior fasting for blood work.  Anton Blas, MD

## 2024-02-11 NOTE — Assessment & Plan Note (Signed)
 Encouraged continue cutting down on alcohol intake.

## 2024-02-11 NOTE — Assessment & Plan Note (Signed)
 Has not been taking statin - encouraged restarting this in h/o NSTEMI.  The ASCVD Risk score (Arnett DK, et al., 2019) failed to calculate for the following reasons:   Risk score cannot be calculated because patient has a medical history suggesting prior/existing ASCVD

## 2024-02-11 NOTE — Assessment & Plan Note (Signed)
 He has not been taking statin. Encouraged he start in h/o NSTEMI 03/2023 , also rec restart aspirin .

## 2024-02-11 NOTE — Progress Notes (Incomplete)
 Ph: [phone] Fax: [phone]   Patient ID: [name], male    DOB: [birth year], 53 y.o.   MRN: [id number]  This visit was conducted in person.  BP 118/82   Pulse 86   Temp 98 F (36.7 C) (Oral)   Ht 5' 8 (1.727 m)   Wt 144 lb (65.3 kg)   SpO2 98%   BMI 21.90 kg/m    CC: CPE Subjective:   HPI: [name] is a 53 y.o. male presenting on 02/08/2024 for Annual Exam   H/o NSTEMI 03/2023 based on increased TnI however LHC at the time with minimal CAD found, did have 35% stenosis to mid RCA. ?plaque rupture causing symptoms.   Saw rheum 01/2021 thought predominantly osteoarthritis related pain.  Severe eczema/atopic dermatitis on Dupixent  by dermatologist.   HLD - has not been taking atorvastatin . He is not regularly taking aspirin .   Request STD screen - no new sexual partners.   Preventative: Colonoscopy 12/2021 - 5 polyps (SSP, TA, HP), int hem, rpt 5 yrs Renaye)  Sister recently diagnosed with colon cancer  Prostate cancer screening - no nocturia. No fmhx. No prostate symptoms. PSA yearly Lung cancer screening - discussed, will refer Flu shot - declines COVID vaccine - declines Tdap 10/2017 Pneumovax 06/2017 Shingrix - discussed, declines Seat belt use discussed Sunscreen use discussed. No changing moles on skin. Father with skin cancer to nose.  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 Interim medical history since our last visit reviewed. Allergies and medications reviewed and updated. Outpatient Medications Prior to Visit  Medication Sig Dispense Refill  . aspirin  EC 81 MG tablet Take 1 tablet (81 mg total) by mouth daily. Swallow whole. 30 tablet 12   . EPINEPHrine  0.3 mg/0.3 mL IJ SOAJ injection Inject 0.3 mg into the muscle daily as needed. 2 each 0  . atorvastatin  (LIPITOR) 40 MG tablet Take 1 tablet (40 mg total) by mouth daily. (Patient not taking: Reported on 02/08/2024) 90 tablet 3  . Dupilumab  (DUPIXENT ) 300 MG/2ML SOAJ Inject 300 mg into the skin every 14 (fourteen) days. Starting at day 15 for maintenance. (Patient not taking: Reported on 02/08/2024) 4 mL 11  . senna-docusate (SENOKOT-S) 8.6-50 MG tablet Take 1 tablet by mouth at bedtime as needed for mild constipation. (Patient not taking: Reported on 02/08/2024) 20 tablet 0   No facility-administered medications prior to visit.     Per HPI unless specifically indicated in ROS section below Review of Systems  Constitutional:  Negative for activity change, appetite change, chills, fatigue, fever and unexpected weight change.  HENT:  Negative for hearing loss.   Eyes:  Negative for visual disturbance.  Respiratory:  Positive for cough. Negative for chest tightness, shortness of breath and wheezing.   Cardiovascular:  Negative for chest pain, palpitations and leg swelling.  Gastrointestinal:  Negative for abdominal distention, abdominal pain, blood in stool, constipation, diarrhea, nausea and vomiting.  Genitourinary:  Negative for difficulty urinating and hematuria.  Musculoskeletal:  Positive for arthralgias. Negative for myalgias and neck pain.  Skin:  Negative for rash.  Neurological:  Negative for dizziness, seizures, syncope and headaches.  Hematological:  Negative  for adenopathy. Does not bruise/bleed easily.  Psychiatric/Behavioral:  Negative for dysphoric mood. The patient is not nervous/anxious.     Objective:  BP 118/82   Pulse 86   Temp 98 F (36.7 C) (Oral)   Ht 5' 8 (1.727 m)   Wt 144 lb (65.3 kg)   SpO2 98%   BMI 21.90 kg/m   Wt Readings from Last 3 Encounters:  02/08/24 144 lb (65.3 kg)  11/30/23 141 lb (64 kg)  08/15/23 144 lb 2 oz (65.4 kg)       Physical Exam Vitals and nursing note reviewed.  Constitutional:      General: He is not in acute distress.    Appearance: Normal appearance. He is well-developed. He is not ill-appearing.  HENT:     Head: Normocephalic and atraumatic.     Right Ear: Hearing, tympanic membrane, ear canal and external ear normal.     Left Ear: Hearing, tympanic membrane, ear canal and external ear normal.     Mouth/Throat:     Mouth: Mucous membranes are moist.     Pharynx: Oropharynx is clear. No oropharyngeal exudate or posterior oropharyngeal erythema.  Eyes:     General: No scleral icterus.    Extraocular Movements: Extraocular movements intact.     Conjunctiva/sclera: Conjunctivae normal.     Pupils: Pupils are equal, round, and reactive to light.  Neck:     Thyroid : No thyroid  mass or thyromegaly.  Cardiovascular:     Rate and Rhythm: Normal rate and regular rhythm.     Pulses: Normal pulses.          Radial pulses are 2+ on the right side and 2+ on the left side.     Heart sounds: Normal heart sounds. No murmur heard. Pulmonary:     Effort: Pulmonary effort is normal. No respiratory distress.     Breath sounds: Normal breath sounds. No wheezing, rhonchi or rales.  Abdominal:     General: Bowel sounds are normal. There is no distension.     Palpations: Abdomen is soft. There is no mass.     Tenderness: There is no abdominal tenderness. There is no guarding or rebound.     Hernia: No hernia is present.  Musculoskeletal:        General: Normal range of motion.     Cervical back: Normal range of motion and neck supple.     Right lower leg: No edema.     Left lower leg: No edema.  Lymphadenopathy:     Cervical: No cervical adenopathy.  Skin:    General: Skin is warm and dry.     Findings: No rash.  Neurological:     General: No focal deficit present.     Mental Status: He is alert and oriented to person, place, and time.  Psychiatric:        Mood and Affect: Mood normal.         Behavior: Behavior normal.        Thought Content: Thought content normal.        Judgment: Judgment normal.       Results for orders placed or performed in visit on 02/08/24  C. trachomatis/N. gonorrhoeae RNA   Collection Time: 02/08/24 12:52 PM   Specimen: Blood  Result Value Ref Range   C. trachomatis RNA, TMA NOT DETECTED NOT DETECTED   N. gonorrhoeae RNA, TMA NOT DETECTED NOT DETECTED  RPR   Collection Time: 02/08/24 12:52 PM  Result Value Ref  Range   RPR Ser Ql NON-REACTIVE NON-REACTIVE  HIV Antibody (routine testing w rflx)   Collection Time: 02/08/24 12:52 PM  Result Value Ref Range   HIV FINAL INTERPRETATION HIV NEGATIVE    HIV 1&2 Ab, 4th Generation NON-REACTIVE NON-REACTIVE  Lipid panel   Collection Time: 02/08/24 12:52 PM  Result Value Ref Range   Cholesterol 241 (H) 0 - 200 mg/dL   Triglycerides 842.9 (H) 0.0 - 149.0 mg/dL   HDL 38.89 >60.99 mg/dL   VLDL 68.5 0.0 - 59.9 mg/dL   LDL Cholesterol 851 (H) 0 - 99 mg/dL   Total CHOL/HDL Ratio 4    NonHDL 179.60   PSA   Collection Time: 02/08/24 12:52 PM  Result Value Ref Range   PSA 1.61 0.10 - 4.00 ng/mL  Basic metabolic panel with GFR   Collection Time: 02/08/24 12:52 PM  Result Value Ref Range   Sodium 140 135 - 145 mEq/L   Potassium 4.7 3.5 - 5.1 mEq/L   Chloride 101 96 - 112 mEq/L   CO2 30 19 - 32 mEq/L   Glucose, Bld 78 70 - 99 mg/dL   BUN 10 6 - 23 mg/dL   Creatinine, Ser 9.36 0.40 - 1.50 mg/dL   GFR 892.17 >39.99 mL/min   Calcium  9.9 8.4 - 10.5 mg/dL  Vitamin A87   Collection Time: 02/08/24 12:52 PM  Result Value Ref Range   Vitamin B-12 182 (L) 211 - 911 pg/mL      02/08/2024   12:21 PM 11/30/2023    1:43 PM 07/27/2023   12:10 PM 05/02/2023    2:47 PM 04/17/2023    8:41 AM  Depression screen PHQ 2/9  Decreased Interest 0 0 0 0 0  Down, Depressed, Hopeless  0 0 0 0  PHQ - 2 Score 0 0 0 0 0  Altered sleeping 0 0 0  0  Tired, decreased energy  0 1  0  Change in appetite 0 0 1  0  Feeling bad  or failure about yourself  0 0 1  0  Trouble concentrating  0 0  0  Moving slowly or fidgety/restless 0 0 0  0  Suicidal thoughts 0 0 0  0  PHQ-9 Score 0 0 3  0  Difficult doing work/chores Not difficult at all Not difficult at all Not difficult at all  Not difficult at all       02/08/2024   12:22 PM 11/30/2023    1:43 PM 07/27/2023   12:10 PM 05/02/2023    2:47 PM  GAD 7 : Generalized Anxiety Score  Nervous, Anxious, on Edge  1 1 0  Control/stop worrying 0 1 1 0  Worry too much - different things 0 1 1 0  Trouble relaxing 0 1 1 0  Restless 0 0 0 0  Easily annoyed or irritable 0 1 2 0  Afraid - awful might happen 0 1 2 0  Total GAD 7 Score  6 8 0  Anxiety Difficulty Not difficult at all Somewhat difficult Somewhat difficult    Assessment & Plan:   Problem List Items Addressed This Visit     Health maintenance examination - Primary (Chronic)   Preventative protocols reviewed and updated unless pt declined. Discussed healthy diet and lifestyle.       Habitual alcohol use   Relevant Orders   Vitamin B12 (Completed)   Vitamin B1   Smoker   HLD (hyperlipidemia)   Relevant Orders   Lipid panel (  Completed)   Basic metabolic panel with GFR (Completed)   CAD (coronary artery disease)   Relevant Orders   Lipoprotein A (LPA)   Other Visit Diagnoses       Screen for STD (sexually transmitted disease)       Relevant Orders   RPR (Completed)   HIV Antibody (routine testing w rflx) (Completed)   C. trachomatis/N. gonorrhoeae RNA (Completed)     Special screening for malignant neoplasm of prostate       Relevant Orders   PSA (Completed)        No orders of the defined types were placed in this encounter.   Orders Placed This Encounter  Procedures  . C. trachomatis/N. gonorrhoeae RNA  . RPR  . HIV Antibody (routine testing w rflx)  . Lipid panel  . PSA  . Basic metabolic panel with GFR  . Vitamin B12  . Vitamin B1  . Lipoprotein A (LPA)    Patient Instructions   Labs today  Consider restarting atorvastatin  and daily aspirin  81mg  You may call below # to schedule hand surgeon appointment.  Referring To Provider Information OZELL BAIZE [address] [place]  [place] [address] [phone]  Good to see you today Return as needed or in 1 year for next physical   Follow up plan: Return in about 1 year (around 02/07/2025) for annual exam, prior fasting for blood work.  Anton Blas, MD

## 2024-02-12 ENCOUNTER — Other Ambulatory Visit: Payer: Self-pay

## 2024-02-12 DIAGNOSIS — Z87891 Personal history of nicotine dependence: Secondary | ICD-10-CM

## 2024-02-12 DIAGNOSIS — F17210 Nicotine dependence, cigarettes, uncomplicated: Secondary | ICD-10-CM

## 2024-02-12 DIAGNOSIS — Z122 Encounter for screening for malignant neoplasm of respiratory organs: Secondary | ICD-10-CM

## 2024-02-12 NOTE — Assessment & Plan Note (Signed)
 Chronic, ongoing affecting daily living - refer to hand surgery for further evaluation

## 2024-02-12 NOTE — Assessment & Plan Note (Signed)
 Appreciate derm care - continue dupixent 

## 2024-02-12 NOTE — Assessment & Plan Note (Addendum)
 Continued 1ppd smoker - encouraged full cessation  Due for rpt yearly lung cancer screening CT - will refer

## 2024-02-13 LAB — VITAMIN B1: Vitamin B1 (Thiamine): 7 nmol/L — ABNORMAL LOW (ref 8–30)

## 2024-02-13 LAB — LIPOPROTEIN A (LPA): Lipoprotein (a): <10 nmol/L (ref <75)

## 2024-02-13 LAB — HIV ANTIBODY (ROUTINE TESTING W REFLEX)
HIV 1&2 Ab, 4th Generation: NONREACTIVE
HIV FINAL INTERPRETATION: NEGATIVE

## 2024-02-13 LAB — C. TRACHOMATIS/N. GONORRHOEAE RNA
C. trachomatis RNA, TMA: NOT DETECTED
N. gonorrhoeae RNA, TMA: NOT DETECTED

## 2024-02-13 LAB — RPR: RPR Ser Ql: NONREACTIVE

## 2024-02-20 ENCOUNTER — Ambulatory Visit: Payer: Self-pay | Admitting: Family Medicine

## 2024-02-20 DIAGNOSIS — E785 Hyperlipidemia, unspecified: Secondary | ICD-10-CM

## 2024-02-20 MED ORDER — VITAMIN B-12 1000 MCG PO TABS: 1000.0000 ug | ORAL_TABLET | Freq: Every day | ORAL | Status: AC

## 2024-02-20 MED ORDER — THIAMINE HCL 100 MG PO TABS
100.0000 mg | ORAL_TABLET | ORAL | Status: AC
Start: 2024-02-20 — End: ?

## 2024-02-25 NOTE — Telephone Encounter (Signed)
 Copied from CRM #[id number]. Topic: Clinical - Lab/Test Results >> Feb 25, 2024 10:30 AM Maisie BROCKS wrote: Reason for CRM: pt called back and requested for a nurse to call and discuss his lab results.

## 2024-03-05 ENCOUNTER — Encounter: Payer: Self-pay | Admitting: Dermatology

## 2024-03-05 ENCOUNTER — Ambulatory Visit (INDEPENDENT_AMBULATORY_CARE_PROVIDER_SITE_OTHER): Admitting: Dermatology

## 2024-03-05 DIAGNOSIS — L299 Pruritus, unspecified: Secondary | ICD-10-CM | POA: Diagnosis not present

## 2024-03-05 DIAGNOSIS — L209 Atopic dermatitis, unspecified: Secondary | ICD-10-CM

## 2024-03-05 MED ORDER — DUPILUMAB 300 MG/2ML ~~LOC~~ SOAJ
600.0000 mg | Freq: Once | SUBCUTANEOUS | Status: AC
  Administered 2024-03-05: 600 mg via SUBCUTANEOUS

## 2024-03-05 NOTE — Patient Instructions (Addendum)

## 2024-03-05 NOTE — Progress Notes (Signed)
   Follow-Up Visit   Subjective  [name] is a 53 y.o. male who presents for the following: Eczema  Patient present today for follow up visit for Eczema. Patient was last evaluated on 06/13/2023. At this visit patient had 2nd dose of Dupixent  injections Completed. He was previously Approved and receiving shipments of Dupixent  but is now denied due missed appointments (Unable to miss work) and no documentation of improvement. Today patient reports his itch is 10 out of 10. He reports his last injection was about 2 months ago. Patient reports sxs were significantly better while on Dupixent  Therapy but now has progressively gotten  worse. Patient reports the current state of his condition has severely negatively impacted his quality of life. Patient denies any additional medication changes.  Patient has previously tried and failed the following Medication: - Prednisone  20 mg 12/2013 - current - Hydralazine  Injection 05/2017 - 04/2023 - Famotidine  05/2017 - 04/2023 - Triamcinolone  Ointment 12/2021 - Current - Hydroxyzine  25 mg tablets 03/2023 - current - Fluocinonide  Cream 0.1% 04/2021 - 04/2022 - Benadryl  04/2023 - Current - Tacrolimus  0.1% 04/2023- Current  Patient provided verbal consent for the use of an AI-assisted program to generate a detailed after-visit summary. The patient understands that the AI tool is used to support clinical documentation and that all information will be reviewed and verified by the healthcare provider.  The following portions of the chart were reviewed this encounter and updated as appropriate: medications, allergies, medical history  Review of Systems:  No other skin or systemic complaints except as noted in HPI or Assessment and Plan.  Objective  Well appearing patient in no apparent distress; mood and affect are within normal limits.  A full examination was performed including scalp, head, eyes, ears, nose, lips, neck, chest, axillae, abdomen, back, buttocks,  bilateral upper extremities, bilateral lower extremities, hands, feet, fingers, toes, fingernails, and toenails. All findings within normal limits unless otherwise noted below.   Relevant exam findings are noted in the Assessment and Plan.                        Assessment & Plan   ATOPIC DERMATITIS with Severe PRURITUS Exam: Scaly pink papules coalescing to plaques 50% BSA IGA: 3  Flared  Atopic dermatitis (eczema) is a chronic, relapsing, pruritic condition that can significantly affect quality of life. It is often associated with allergic rhinitis and/or asthma and can require treatment with topical medications, phototherapy, or in severe cases biologic injectable medication (Dupixent ; Adbry) or Oral JAK inhibitors.  Treatment Plan: - Restart Dupixent  Therapy - Patient instructed how to complete Dupixent  Injections with Demonstration Pen - Patient completed Injections while in office at [employer], Mild erythema noted at injection site.   - Advised to continue topicals for break through Eczema Flares - Patient tolerated well and demonstrated understanding - Recommend gentle skin care - Plan to follow up in 8 months   DUPIXENT  (office sample) NDC: 9975-4084-79 LOT: [id number] EXP: 10/21/2024  ATOPIC DERMATITIS, UNSPECIFIED TYPE   Related Medications Dupilumab  (DUPIXENT ) 300 MG/2ML SOAJ Inject 300 mg into the skin every 14 (fourteen) days. Starting at day 15 for maintenance. Dupilumab  SOAJ 600 mg   Return in about 8 months (around 11/02/2024) for Eczema F/U.  I, Jetta Ager, am acting as neurosurgeon for [employer], DO.  Documentation: I have reviewed the above documentation for accuracy and completeness, and I agree with the above.  Delon Lenis, DO

## 2024-03-17 ENCOUNTER — Ambulatory Visit: Admitting: Dermatology

## 2024-05-28 IMAGING — CT CT CHEST LUNG CANCER SCREENING LOW DOSE W/O CM
2 of 5 series · 15 of 40 positions shown, 18 images · non-contrast
Comparison: None Available.

CLINICAL DATA: Lung cancer screening. Current asymptomatic smoker
with 49 pack-year history.



[Series 4: lung 1.00 br44 cor · coronal · 0.74mm/px · 3 of 314 slices shown]
[im 63/314  lung]
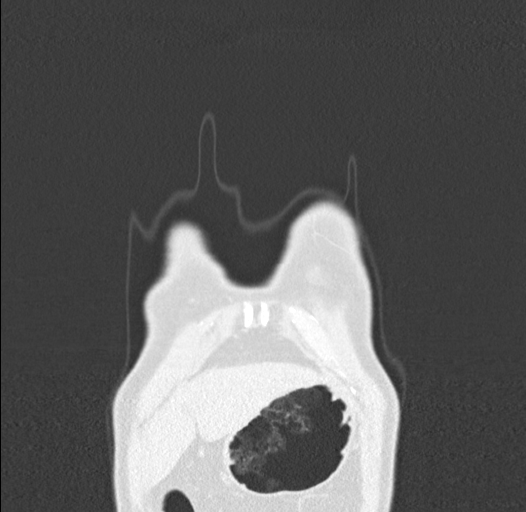
[im 126/314  lung]
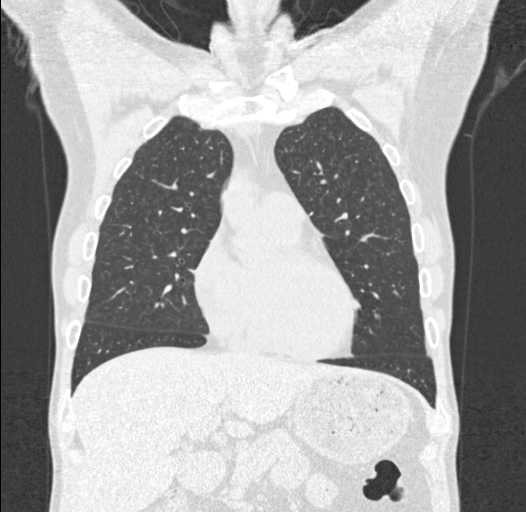
[im 188/314  lung]
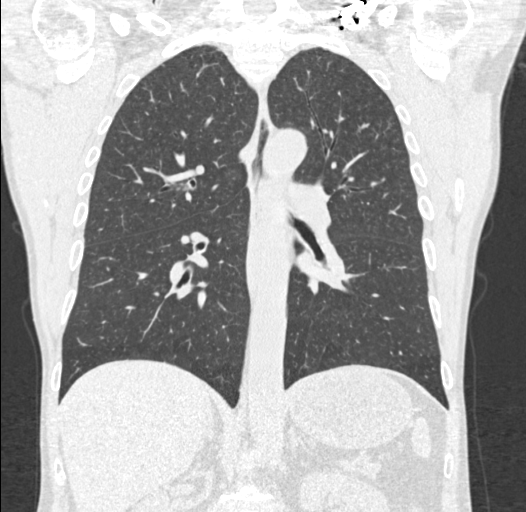

[Series 9: lung 1.00 br60 axial · axial · 0.76mm/px · z∈[-1372,-1031]mm · 12 of 377 slices shown, 15 images]
[im 18/377  mediastinal]
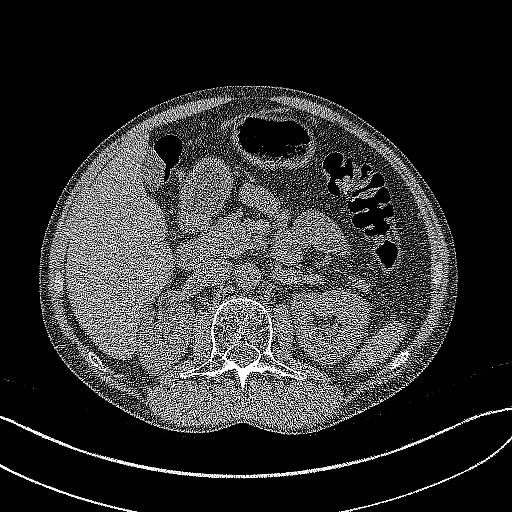
[im 18/377  lung]
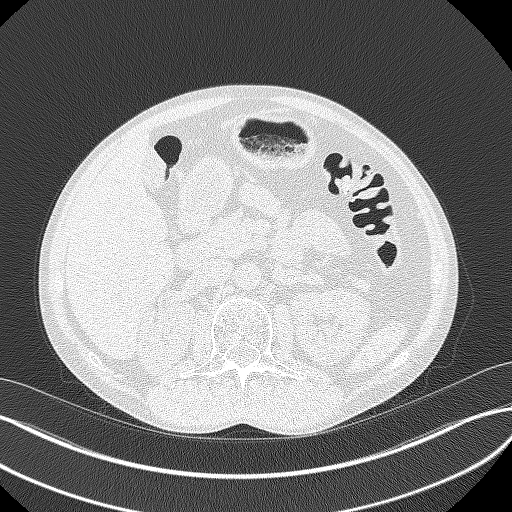
[im 52/377  lung]
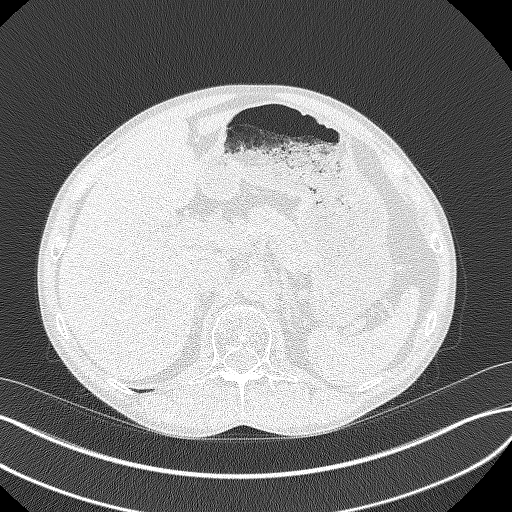
[im 86/377  lung]
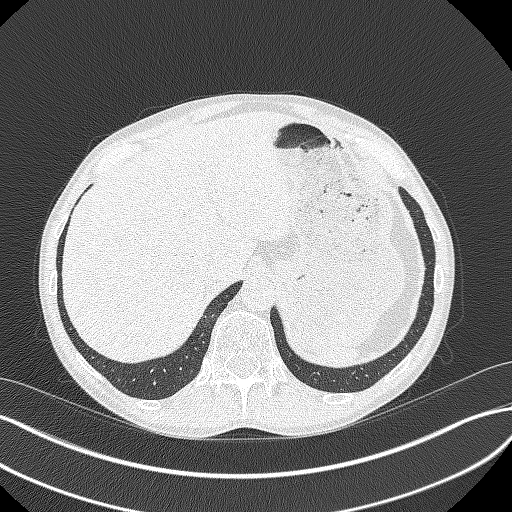
[im 120/377  lung]
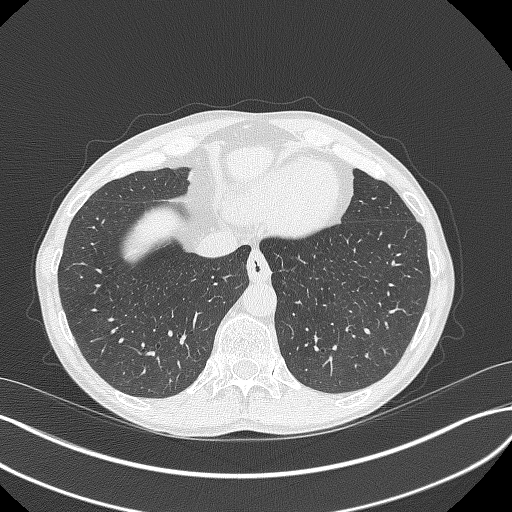
[im 137/377  mediastinal]
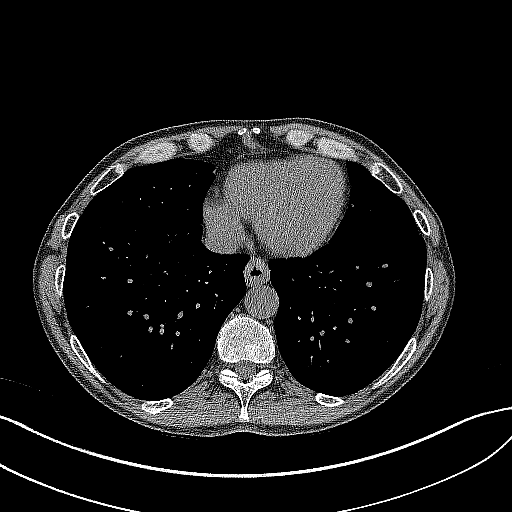
[im 137/377  lung]
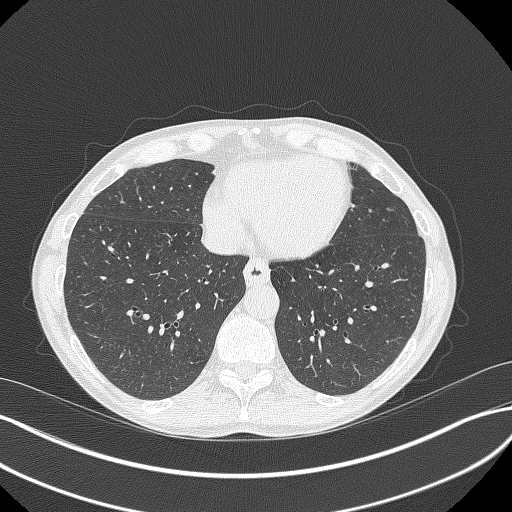
[im 171/377  lung]
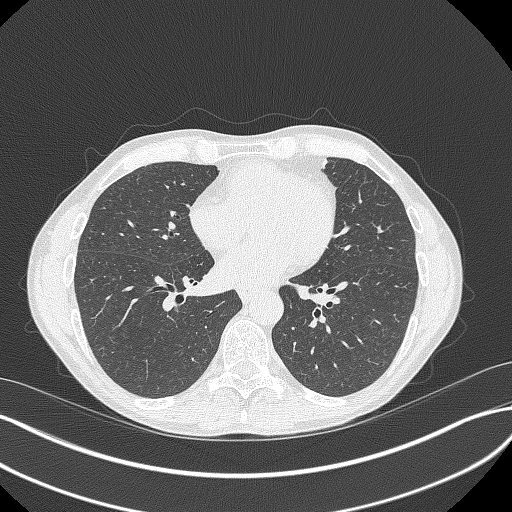
[im 206/377  lung]
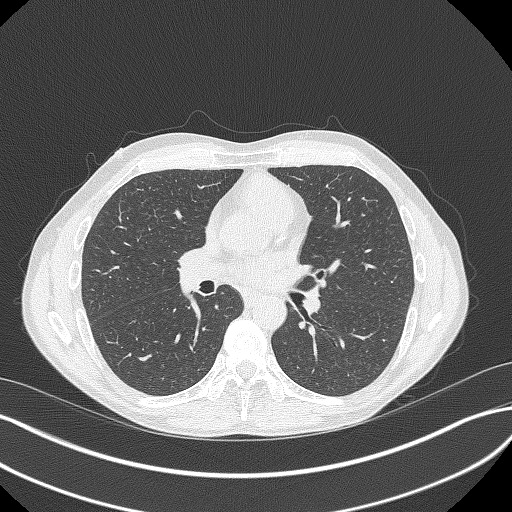
[im 240/377  lung]
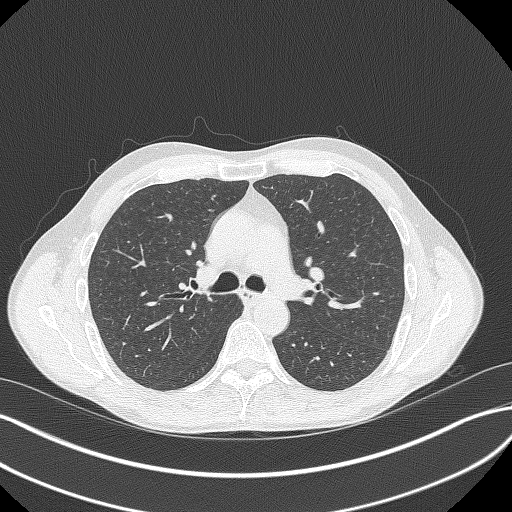
[im 257/377  mediastinal]
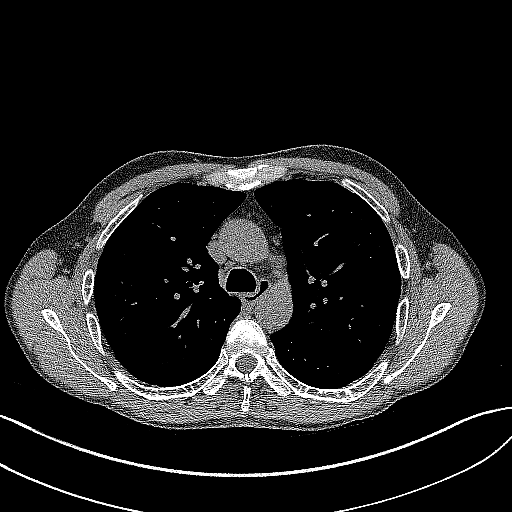
[im 257/377  lung]
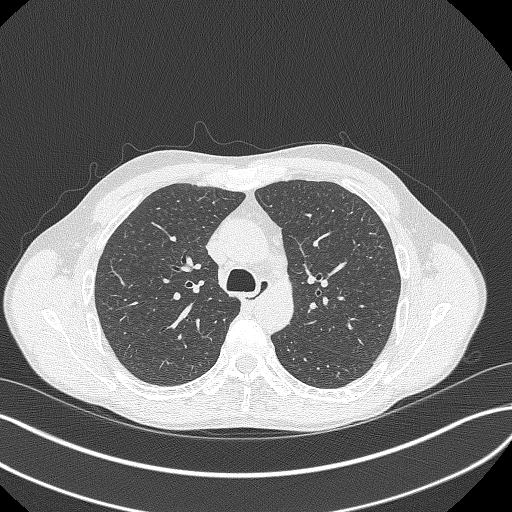
[im 291/377  lung]
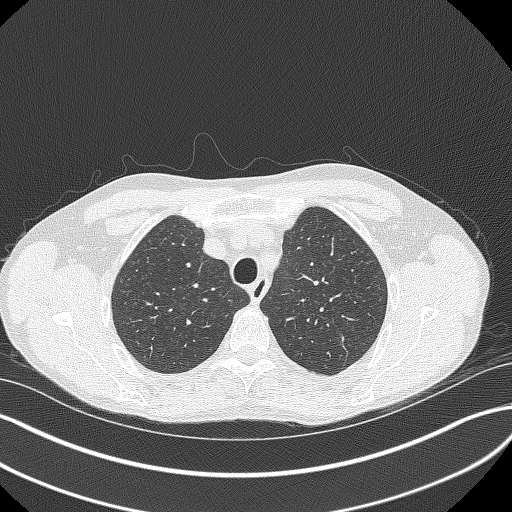
[im 325/377  lung]
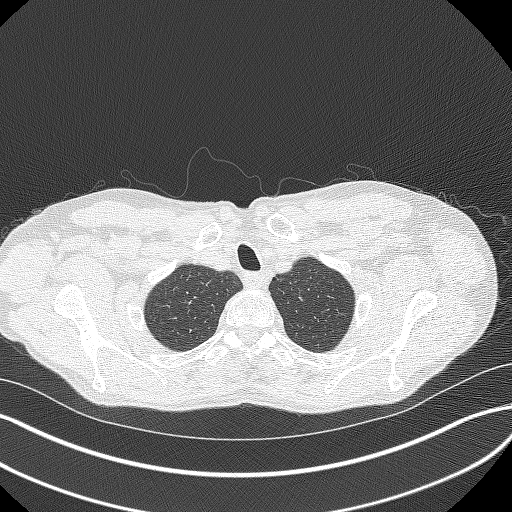
[im 359/377  lung]
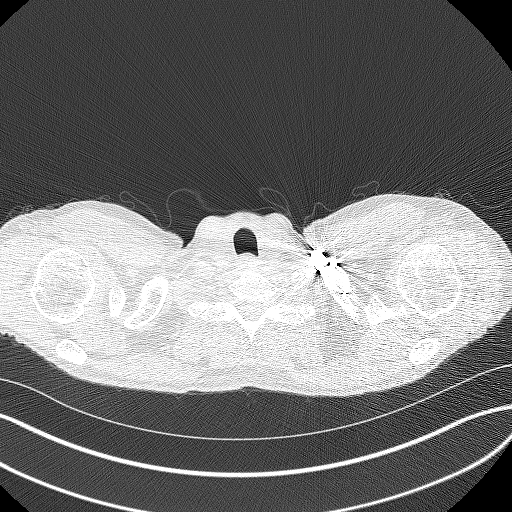

[15 of 40 positions shown; findings below may reference images not displayed]

FINDINGS: Cardiovascular: Normal heart size. Lad coronary artery
calcification. No pericardial effusion.

Mediastinum/Nodes: No enlarged axillary, supraclavicular, or
mediastinal lymph nodes. There is mild circumferential wall
thickening involving the distal esophagus, image 49/2. Although
nonspecific this may reflect underlying esophagitis. Thyroid gland
and trachea demonstrate no significant findings.

Lungs/Pleura: Paraseptal and centrilobular emphysema. Diffuse upper
lobe predominant ground-glass centrilobular nodularity is identified
compatible with smoking related respiratory
bronchiolitis-interstitial lung disease (MARLOES THODE). There are several
tiny lung nodules identified. The largest is in the left lower lobe
with a mean derived diameter of 3 mm.

Upper Abdomen: No acute abnormality.

Musculoskeletal: No acute or suspicious osseous findings. Previous
plate and screw fixation of the left clavicle.
IMPRESSION: 1. Lung-RADS 2, benign appearance or behavior. Continue annual
screening with low-dose chest CT without contrast in 12 months.
2. Mild circumferential wall thickening involving the distal
esophagus, image 49/2. Although nonspecific this may reflect
underlying esophagitis. Clinical correlation advised.
3. Coronary artery calcifications
4. Emphysema ([id number]).
